# Patient Record
Sex: Female | Born: 1980 | Race: Black or African American | Hispanic: No | Smoking: Current every day smoker
Health system: Southern US, Community
[De-identification: ages and names within clinical notes are randomized; demographics above are authoritative.]

## PROBLEM LIST (undated history)

## (undated) DIAGNOSIS — I10 Essential (primary) hypertension: Secondary | ICD-10-CM

## (undated) DIAGNOSIS — E119 Type 2 diabetes mellitus without complications: Secondary | ICD-10-CM

## (undated) DIAGNOSIS — I2510 Atherosclerotic heart disease of native coronary artery without angina pectoris: Secondary | ICD-10-CM

## (undated) DIAGNOSIS — I219 Acute myocardial infarction, unspecified: Secondary | ICD-10-CM

## (undated) DIAGNOSIS — E785 Hyperlipidemia, unspecified: Secondary | ICD-10-CM

## (undated) HISTORY — PX: BREAST CYST ASPIRATION: SHX578

## (undated) HISTORY — PX: CARDIAC SURGERY: SHX584

## (undated) HISTORY — PX: STENT PLACEMENT VASCULAR (ARMC HX): HXRAD1737

---

## 2002-05-28 HISTORY — PX: TUBAL LIGATION: SHX77

## 2005-07-12 ENCOUNTER — Emergency Department: Payer: Self-pay | Admitting: Emergency Medicine

## 2006-12-23 ENCOUNTER — Emergency Department: Payer: Self-pay | Admitting: Emergency Medicine

## 2007-09-22 ENCOUNTER — Emergency Department: Payer: Self-pay | Admitting: Emergency Medicine

## 2007-10-20 ENCOUNTER — Emergency Department: Payer: Self-pay | Admitting: Emergency Medicine

## 2008-05-05 ENCOUNTER — Emergency Department: Payer: Self-pay | Admitting: Unknown Physician Specialty

## 2010-06-26 ENCOUNTER — Emergency Department: Payer: Self-pay | Admitting: Emergency Medicine

## 2012-05-08 ENCOUNTER — Emergency Department: Payer: Self-pay | Admitting: Emergency Medicine

## 2012-07-14 ENCOUNTER — Ambulatory Visit: Payer: Self-pay | Admitting: Internal Medicine

## 2012-07-15 ENCOUNTER — Ambulatory Visit: Payer: Self-pay

## 2012-12-04 ENCOUNTER — Ambulatory Visit: Payer: Self-pay

## 2012-12-06 ENCOUNTER — Ambulatory Visit: Payer: Self-pay | Admitting: Family Medicine

## 2012-12-08 LAB — WOUND CULTURE

## 2013-04-18 ENCOUNTER — Inpatient Hospital Stay: Payer: Self-pay | Admitting: Student

## 2013-04-18 LAB — COMPREHENSIVE METABOLIC PANEL
Albumin: 3.5 g/dL (ref 3.4–5.0)
Alkaline Phosphatase: 73 U/L
BUN: 11 mg/dL (ref 7–18)
Bilirubin,Total: 0.4 mg/dL (ref 0.2–1.0)
Calcium, Total: 9.1 mg/dL (ref 8.5–10.1)
Creatinine: 0.97 mg/dL (ref 0.60–1.30)
EGFR (African American): >60
Glucose: 92 mg/dL (ref 65–99)
SGOT(AST): 44 U/L — ABNORMAL HIGH (ref 15–37)
SGPT (ALT): 38 U/L (ref 12–78)
Sodium: 139 mmol/L (ref 136–145)
Total Protein: 7.7 g/dL (ref 6.4–8.2)

## 2013-04-18 LAB — CBC
HGB: 15 g/dL (ref 12.0–16.0)
MCH: 30 pg (ref 26.0–34.0)
MCHC: 34.5 g/dL (ref 32.0–36.0)
Platelet: 261 10*3/uL (ref 150–440)
RDW: 14.1 % (ref 11.5–14.5)
WBC: 8.8 10*3/uL (ref 3.6–11.0)

## 2013-04-18 LAB — TROPONIN I: Troponin-I: 0.31 ng/mL — ABNORMAL HIGH

## 2013-04-18 LAB — CK-MB: CK-MB: 1.3 ng/mL (ref 0.5–3.6)

## 2013-04-19 LAB — BASIC METABOLIC PANEL
Anion Gap: 5 — ABNORMAL LOW (ref 7–16)
BUN: 13 mg/dL (ref 7–18)
Chloride: 105 mmol/L (ref 98–107)
Co2: 27 mmol/L (ref 21–32)
Creatinine: 0.95 mg/dL (ref 0.60–1.30)
EGFR (African American): >60
Osmolality: 274 (ref 275–301)
Sodium: 137 mmol/L (ref 136–145)

## 2013-04-19 LAB — LIPID PANEL
Cholesterol: 192 mg/dL (ref 0–200)
Triglycerides: 416 mg/dL — ABNORMAL HIGH (ref 0–200)

## 2013-04-19 LAB — APTT
Activated PTT: 59.6 secs — ABNORMAL HIGH (ref 23.6–35.9)
Activated PTT: 56.9 secs — ABNORMAL HIGH (ref 23.6–35.9)

## 2013-04-19 LAB — CK-MB: CK-MB: 4.6 ng/mL — ABNORMAL HIGH (ref 0.5–3.6)

## 2013-04-19 LAB — TROPONIN I: Troponin-I: 2 ng/mL — ABNORMAL HIGH

## 2013-04-19 LAB — TSH: Thyroid Stimulating Horm: 0.56 u[IU]/mL

## 2013-04-19 LAB — HEMOGLOBIN A1C: Hemoglobin A1C: 5.6 % (ref 4.2–6.3)

## 2013-04-20 LAB — APTT: Activated PTT: 71 secs — ABNORMAL HIGH (ref 23.6–35.9)

## 2013-04-20 LAB — PLATELET COUNT: Platelet: 265 10*3/uL (ref 150–440)

## 2013-04-21 LAB — MAGNESIUM: Magnesium: 1.6 mg/dL — ABNORMAL LOW

## 2013-04-21 LAB — BASIC METABOLIC PANEL
Anion Gap: 8 (ref 7–16)
Calcium, Total: 8.7 mg/dL (ref 8.5–10.1)
Chloride: 106 mmol/L (ref 98–107)
Creatinine: 0.74 mg/dL (ref 0.60–1.30)
Glucose: 104 mg/dL — ABNORMAL HIGH (ref 65–99)
Osmolality: 276 (ref 275–301)
Sodium: 138 mmol/L (ref 136–145)

## 2013-04-21 LAB — APTT: Activated PTT: 29.4 secs (ref 23.6–35.9)

## 2013-04-21 LAB — CK-MB: CK-MB: 0.5 ng/mL (ref 0.5–3.6)

## 2013-06-15 ENCOUNTER — Inpatient Hospital Stay: Payer: Self-pay | Admitting: Student

## 2013-06-15 LAB — BASIC METABOLIC PANEL
Anion Gap: 4 — ABNORMAL LOW (ref 7–16)
BUN: 10 mg/dL (ref 7–18)
Calcium, Total: 9.3 mg/dL (ref 8.5–10.1)
Chloride: 107 mmol/L (ref 98–107)
Co2: 28 mmol/L (ref 21–32)
Creatinine: 0.84 mg/dL (ref 0.60–1.30)
EGFR (African American): >60
EGFR (Non-African Amer.): >60
Glucose: 88 mg/dL (ref 65–99)
OSMOLALITY: 276 (ref 275–301)
Potassium: 3.3 mmol/L — ABNORMAL LOW (ref 3.5–5.1)
Sodium: 139 mmol/L (ref 136–145)

## 2013-06-15 LAB — CK-MB
CK-MB: 2.3 ng/mL (ref 0.5–3.6)
CK-MB: 1.7 ng/mL (ref 0.5–3.6)

## 2013-06-15 LAB — CBC
HCT: 44.2 % (ref 35.0–47.0)
HGB: 14.9 g/dL (ref 12.0–16.0)
MCH: 30.4 pg (ref 26.0–34.0)
MCHC: 33.6 g/dL (ref 32.0–36.0)
MCV: 90 fL (ref 80–100)
Platelet: 276 10*3/uL (ref 150–440)
RBC: 4.89 10*6/uL (ref 3.80–5.20)
RDW: 14.5 % (ref 11.5–14.5)
WBC: 8.6 10*3/uL (ref 3.6–11.0)

## 2013-06-15 LAB — TROPONIN I
TROPONIN-I: 0.9 ng/mL — AB
Troponin-I: 1 ng/mL — ABNORMAL HIGH

## 2013-06-15 LAB — PRO B NATRIURETIC PEPTIDE: B-Type Natriuretic Peptide: 430 pg/mL — ABNORMAL HIGH (ref 0–125)

## 2013-06-15 LAB — APTT: ACTIVATED PTT: 26.9 s (ref 23.6–35.9)

## 2013-06-15 LAB — PROTIME-INR
INR: 0.9
Prothrombin Time: 12.1 secs (ref 11.5–14.7)

## 2013-06-16 LAB — CBC WITH DIFFERENTIAL/PLATELET
BASOS ABS: 0.1 10*3/uL (ref 0.0–0.1)
BASOS PCT: 1.2 %
EOS ABS: 0.2 10*3/uL (ref 0.0–0.7)
Eosinophil %: 2.3 %
HCT: 39.8 % (ref 35.0–47.0)
HGB: 13.5 g/dL (ref 12.0–16.0)
Lymphocyte #: 2.1 10*3/uL (ref 1.0–3.6)
Lymphocyte %: 30.4 %
MCH: 30.4 pg (ref 26.0–34.0)
MCHC: 33.8 g/dL (ref 32.0–36.0)
MCV: 90 fL (ref 80–100)
MONO ABS: 0.3 x10 3/mm (ref 0.2–0.9)
Monocyte %: 4.3 %
Neutrophil #: 4.4 10*3/uL (ref 1.4–6.5)
Neutrophil %: 61.8 %
PLATELETS: 244 10*3/uL (ref 150–440)
RBC: 4.43 10*6/uL (ref 3.80–5.20)
RDW: 14 % (ref 11.5–14.5)
WBC: 7 10*3/uL (ref 3.6–11.0)

## 2013-06-16 LAB — CK-MB: CK-MB: 1.6 ng/mL (ref 0.5–3.6)

## 2013-06-16 LAB — LIPID PANEL
Cholesterol: 163 mg/dL (ref 0–200)
HDL: 34 mg/dL — AB (ref 40–60)
Ldl Cholesterol, Calc: 92 mg/dL (ref 0–100)
Triglycerides: 185 mg/dL (ref 0–200)
VLDL CHOLESTEROL, CALC: 37 mg/dL (ref 5–40)

## 2013-06-16 LAB — APTT
Activated PTT: 42.8 secs — ABNORMAL HIGH (ref 23.6–35.9)
Activated PTT: 47.9 secs — ABNORMAL HIGH (ref 23.6–35.9)
Activated PTT: 56.2 secs — ABNORMAL HIGH (ref 23.6–35.9)
Activated PTT: 82.6 secs — ABNORMAL HIGH (ref 23.6–35.9)

## 2013-06-16 LAB — BASIC METABOLIC PANEL
Anion Gap: 6 — ABNORMAL LOW (ref 7–16)
BUN: 8 mg/dL (ref 7–18)
CALCIUM: 8.5 mg/dL (ref 8.5–10.1)
CHLORIDE: 107 mmol/L (ref 98–107)
Co2: 23 mmol/L (ref 21–32)
Creatinine: 0.73 mg/dL (ref 0.60–1.30)
EGFR (African American): >60
EGFR (Non-African Amer.): >60
Glucose: 102 mg/dL — ABNORMAL HIGH (ref 65–99)
Osmolality: 270 (ref 275–301)
POTASSIUM: 3.6 mmol/L (ref 3.5–5.1)
Sodium: 136 mmol/L (ref 136–145)

## 2013-06-16 LAB — GLUCOSE, RANDOM: GLUCOSE: 80 mg/dL (ref 65–99)

## 2013-06-16 LAB — TSH: Thyroid Stimulating Horm: 1 u[IU]/mL

## 2013-06-16 LAB — TROPONIN I: Troponin-I: 0.78 ng/mL — ABNORMAL HIGH

## 2013-06-16 LAB — MAGNESIUM: Magnesium: 1.7 mg/dL — ABNORMAL LOW

## 2013-06-16 LAB — HEMOGLOBIN A1C: HEMOGLOBIN A1C: 5.7 % (ref 4.2–6.3)

## 2013-06-17 LAB — BASIC METABOLIC PANEL
ANION GAP: 5 — AB (ref 7–16)
BUN: 11 mg/dL (ref 7–18)
CHLORIDE: 103 mmol/L (ref 98–107)
CREATININE: 0.76 mg/dL (ref 0.60–1.30)
Calcium, Total: 9.2 mg/dL (ref 8.5–10.1)
Co2: 26 mmol/L (ref 21–32)
EGFR (African American): >60
EGFR (Non-African Amer.): >60
GLUCOSE: 108 mg/dL — AB (ref 65–99)
Osmolality: 268 (ref 275–301)
Potassium: 3.4 mmol/L — ABNORMAL LOW (ref 3.5–5.1)
Sodium: 134 mmol/L — ABNORMAL LOW (ref 136–145)

## 2013-06-17 LAB — CBC WITH DIFFERENTIAL/PLATELET
Basophil #: 0.1 10*3/uL (ref 0.0–0.1)
Basophil %: 1 %
Eosinophil #: 0.2 10*3/uL (ref 0.0–0.7)
Eosinophil %: 2.7 %
HCT: 41 % (ref 35.0–47.0)
HGB: 14 g/dL (ref 12.0–16.0)
LYMPHS PCT: 34.7 %
Lymphocyte #: 2.5 10*3/uL (ref 1.0–3.6)
MCH: 30.7 pg (ref 26.0–34.0)
MCHC: 34.1 g/dL (ref 32.0–36.0)
MCV: 90 fL (ref 80–100)
MONOS PCT: 5.2 %
Monocyte #: 0.4 x10 3/mm (ref 0.2–0.9)
Neutrophil #: 4.1 10*3/uL (ref 1.4–6.5)
Neutrophil %: 56.4 %
Platelet: 241 10*3/uL (ref 150–440)
RBC: 4.55 10*6/uL (ref 3.80–5.20)
RDW: 13.7 % (ref 11.5–14.5)
WBC: 7.2 10*3/uL (ref 3.6–11.0)

## 2013-06-17 LAB — PROTIME-INR
INR: 1
PROTHROMBIN TIME: 13 s (ref 11.5–14.7)

## 2013-06-17 LAB — APTT: Activated PTT: 105.4 secs — ABNORMAL HIGH (ref 23.6–35.9)

## 2013-06-21 ENCOUNTER — Observation Stay: Payer: Self-pay | Admitting: Internal Medicine

## 2013-06-21 LAB — COMPREHENSIVE METABOLIC PANEL
ALT: 21 U/L (ref 12–78)
Albumin: 3.5 g/dL (ref 3.4–5.0)
Alkaline Phosphatase: 68 U/L
Anion Gap: 9 (ref 7–16)
BUN: 19 mg/dL — ABNORMAL HIGH (ref 7–18)
Bilirubin,Total: 0.5 mg/dL (ref 0.2–1.0)
CO2: 24 mmol/L (ref 21–32)
Calcium, Total: 9.1 mg/dL (ref 8.5–10.1)
Chloride: 102 mmol/L (ref 98–107)
Creatinine: 1.26 mg/dL (ref 0.60–1.30)
EGFR (Non-African Amer.): 56 — ABNORMAL LOW
GLUCOSE: 158 mg/dL — AB (ref 65–99)
Osmolality: 276 (ref 275–301)
POTASSIUM: 3 mmol/L — AB (ref 3.5–5.1)
SGOT(AST): 20 U/L (ref 15–37)
SODIUM: 135 mmol/L — AB (ref 136–145)
Total Protein: 7.6 g/dL (ref 6.4–8.2)

## 2013-06-21 LAB — CK TOTAL AND CKMB (NOT AT ARMC)
CK, Total: 104 U/L (ref 21–215)
CK, Total: 97 U/L (ref 21–215)
CK, Total: 99 U/L (ref 21–215)
CK-MB: 2.1 ng/mL (ref 0.5–3.6)
CK-MB: 2.6 ng/mL (ref 0.5–3.6)
CK-MB: 2.1 ng/mL (ref 0.5–3.6)

## 2013-06-21 LAB — CBC WITH DIFFERENTIAL/PLATELET
Basophil #: 0.1 10*3/uL (ref 0.0–0.1)
Basophil %: 0.7 %
EOS PCT: 0.3 %
Eosinophil #: 0.1 10*3/uL (ref 0.0–0.7)
HCT: 43.8 % (ref 35.0–47.0)
HGB: 14.6 g/dL (ref 12.0–16.0)
LYMPHS PCT: 21.4 %
Lymphocyte #: 3.5 10*3/uL (ref 1.0–3.6)
MCH: 29.8 pg (ref 26.0–34.0)
MCHC: 33.3 g/dL (ref 32.0–36.0)
MCV: 90 fL (ref 80–100)
MONO ABS: 0.6 x10 3/mm (ref 0.2–0.9)
MONOS PCT: 3.8 %
NEUTROS PCT: 73.8 %
Neutrophil #: 12.1 10*3/uL — ABNORMAL HIGH (ref 1.4–6.5)
Platelet: 272 10*3/uL (ref 150–440)
RBC: 4.89 10*6/uL (ref 3.80–5.20)
RDW: 14.4 % (ref 11.5–14.5)
WBC: 16.4 10*3/uL — ABNORMAL HIGH (ref 3.6–11.0)

## 2013-06-21 LAB — URINALYSIS, COMPLETE
Bacteria: NONE SEEN
Bilirubin,UR: NEGATIVE
Glucose,UR: NEGATIVE mg/dL (ref 0–75)
Ketone: NEGATIVE
Leukocyte Esterase: NEGATIVE
NITRITE: NEGATIVE
PH: 5 (ref 4.5–8.0)
PROTEIN: NEGATIVE
RBC,UR: NONE SEEN /HPF (ref 0–5)
Specific Gravity: 1.027 (ref 1.003–1.030)

## 2013-06-21 LAB — MAGNESIUM: MAGNESIUM: 1.7 mg/dL — AB

## 2013-06-21 LAB — TROPONIN I
TROPONIN-I: 0.05 ng/mL
TROPONIN-I: 0.05 ng/mL
Troponin-I: 0.05 ng/mL
Troponin-I: 0.04 ng/mL

## 2013-06-21 LAB — PROTIME-INR
INR: 1
PROTHROMBIN TIME: 12.6 s (ref 11.5–14.7)

## 2013-06-21 LAB — DRUG SCREEN, URINE
AMPHETAMINES, UR SCREEN: NEGATIVE (ref ?–1000)
BENZODIAZEPINE, UR SCRN: NEGATIVE (ref ?–200)
Barbiturates, Ur Screen: NEGATIVE (ref ?–200)
CANNABINOID 50 NG, UR ~~LOC~~: POSITIVE (ref ?–50)
COCAINE METABOLITE, UR ~~LOC~~: NEGATIVE (ref ?–300)
MDMA (ECSTASY) UR SCREEN: NEGATIVE (ref ?–500)
METHADONE, UR SCREEN: NEGATIVE (ref ?–300)
OPIATE, UR SCREEN: NEGATIVE (ref ?–300)
Phencyclidine (PCP) Ur S: NEGATIVE (ref ?–25)
TRICYCLIC, UR SCREEN: NEGATIVE (ref ?–1000)

## 2013-06-21 LAB — APTT: Activated PTT: 26.9 secs (ref 23.6–35.9)

## 2013-06-21 LAB — LIPASE, BLOOD: Lipase: 146 U/L (ref 73–393)

## 2013-06-21 LAB — PREGNANCY, URINE: Pregnancy Test, Urine: NEGATIVE m[IU]/mL

## 2013-06-22 LAB — CBC WITH DIFFERENTIAL/PLATELET
BASOS PCT: 1.3 %
Basophil #: 0.1 10*3/uL (ref 0.0–0.1)
EOS PCT: 1.9 %
Eosinophil #: 0.1 10*3/uL (ref 0.0–0.7)
HCT: 44.2 % (ref 35.0–47.0)
HGB: 14.8 g/dL (ref 12.0–16.0)
LYMPHS ABS: 2.9 10*3/uL (ref 1.0–3.6)
Lymphocyte %: 41.1 %
MCH: 30.3 pg (ref 26.0–34.0)
MCHC: 33.5 g/dL (ref 32.0–36.0)
MCV: 90 fL (ref 80–100)
Monocyte #: 0.5 x10 3/mm (ref 0.2–0.9)
Monocyte %: 7.6 %
Neutrophil #: 3.4 10*3/uL (ref 1.4–6.5)
Neutrophil %: 48.1 %
Platelet: 242 10*3/uL (ref 150–440)
RBC: 4.89 10*6/uL (ref 3.80–5.20)
RDW: 14.3 % (ref 11.5–14.5)
WBC: 7.1 10*3/uL (ref 3.6–11.0)

## 2013-06-22 LAB — BASIC METABOLIC PANEL
ANION GAP: 2 — AB (ref 7–16)
BUN: 18 mg/dL (ref 7–18)
CHLORIDE: 102 mmol/L (ref 98–107)
Calcium, Total: 9.5 mg/dL (ref 8.5–10.1)
Co2: 28 mmol/L (ref 21–32)
Creatinine: 0.82 mg/dL (ref 0.60–1.30)
EGFR (Non-African Amer.): >60
GLUCOSE: 100 mg/dL — AB (ref 65–99)
Osmolality: 267 (ref 275–301)
POTASSIUM: 3.5 mmol/L (ref 3.5–5.1)
Sodium: 132 mmol/L — ABNORMAL LOW (ref 136–145)

## 2013-06-22 LAB — LIPID PANEL
CHOLESTEROL: 195 mg/dL (ref 0–200)
HDL Cholesterol: 32 mg/dL — ABNORMAL LOW (ref 40–60)
Ldl Cholesterol, Calc: 107 mg/dL — ABNORMAL HIGH (ref 0–100)
TRIGLYCERIDES: 280 mg/dL — AB (ref 0–200)
VLDL CHOLESTEROL, CALC: 56 mg/dL — AB (ref 5–40)

## 2013-09-02 ENCOUNTER — Encounter: Payer: Self-pay | Admitting: Cardiovascular Disease

## 2014-01-07 LAB — CBC
HCT: 45.6 % (ref 35.0–47.0)
HGB: 15.5 g/dL (ref 12.0–16.0)
MCH: 30.4 pg (ref 26.0–34.0)
MCHC: 33.9 g/dL (ref 32.0–36.0)
MCV: 90 fL (ref 80–100)
PLATELETS: 266 10*3/uL (ref 150–440)
RBC: 5.08 10*6/uL (ref 3.80–5.20)
RDW: 13.8 % (ref 11.5–14.5)
WBC: 10.8 10*3/uL (ref 3.6–11.0)

## 2014-01-08 ENCOUNTER — Observation Stay: Payer: Self-pay | Admitting: Internal Medicine

## 2014-01-08 LAB — CK TOTAL AND CKMB (NOT AT ARMC)
CK, TOTAL: 75 U/L
CK, Total: 86 U/L
CK, Total: 87 U/L
CK-MB: <0.5 ng/mL — ABNORMAL LOW (ref 0.5–3.6)
CK-MB: <0.5 ng/mL — ABNORMAL LOW (ref 0.5–3.6)
CK-MB: <0.5 ng/mL — ABNORMAL LOW (ref 0.5–3.6)

## 2014-01-08 LAB — BASIC METABOLIC PANEL
Anion Gap: 5 — ABNORMAL LOW (ref 7–16)
BUN: 19 mg/dL — AB (ref 7–18)
CALCIUM: 9.9 mg/dL (ref 8.5–10.1)
Chloride: 102 mmol/L (ref 98–107)
Co2: 29 mmol/L (ref 21–32)
Creatinine: 0.86 mg/dL (ref 0.60–1.30)
EGFR (African American): >60
Glucose: 95 mg/dL (ref 65–99)
Osmolality: 274 (ref 275–301)
POTASSIUM: 3.4 mmol/L — AB (ref 3.5–5.1)
SODIUM: 136 mmol/L (ref 136–145)

## 2014-01-08 LAB — TROPONIN I: Troponin-I: <0.02 ng/mL

## 2014-01-08 LAB — LIPID PANEL
Cholesterol: 214 mg/dL — ABNORMAL HIGH (ref 0–200)
HDL Cholesterol: 42 mg/dL (ref 40–60)
Ldl Cholesterol, Calc: 116 mg/dL — ABNORMAL HIGH (ref 0–100)
TRIGLYCERIDES: 280 mg/dL — AB (ref 0–200)
VLDL Cholesterol, Calc: 56 mg/dL — ABNORMAL HIGH (ref 5–40)

## 2014-01-08 LAB — D-DIMER(ARMC): D-DIMER: 436 ng/mL

## 2014-02-19 ENCOUNTER — Emergency Department: Payer: Self-pay | Admitting: Emergency Medicine

## 2014-07-19 ENCOUNTER — Emergency Department: Payer: Self-pay | Admitting: Emergency Medicine

## 2014-09-17 NOTE — H&P (Signed)
PATIENT NAME:  COUSIVerdon Cummins, Chandrea H MR#:  [id number] DATE OF BIRTH:  [birth year]  DATE OF ADMISSION:  04/18/2013  EMERGENCY DEPARTMENT REFERRING PHYSICIAN: Lurena JoinerRebecca L. Shaune PollackLord, MD  PRIMARY CARE PROVIDER: Courtney ParisElizabeth B. White, FNP  CHIEF COMPLAINT: Right-sided headache, right-sided neck pain, as well as left-sided chest pain.   HISTORY OF PRESENT ILLNESS: The patient is a 34 year old African American female, morbidly obese, who has a history of diabetes, which is borderline, as well as hypertension. She has also been told that her cholesterol has been high, who reports earlier today she started developing right-sided headache, which was sharp in nature. The pain went down to her shoulder as well as her neck. She stated that the symptoms were very severe. When she arrived in the ED, she was noted to have a systolic blood pressure of 206/118. The patient received a nitroglycerin patch, and her blood pressure did decrease. Her pain also improved. The patient also stated that she had an episode lasting a few minutes of left-sided chest pressure, and it went away. She has not had these type of symptoms before. She also got short of breath. Came to the ED. When in the ER, she was noted to have a troponin of 0.31. She otherwise denies any nausea, vomiting, or urinary symptoms.   PAST MEDICAL HISTORY: Diabetes type 2 borderline, hypertension, has been told that her cholesterol is high.   PAST SURGICAL HISTORY: Has had surgical resection of lesions under her arms.   ALLERGIES: None.  CURRENT MEDICATIONS: As outpatient, she is on benazepril/HCTZ 20/25 one tab p.o. daily,  trazodone 50 daily.   SOCIAL HISTORY: Smokes about 1 pack per day and drinks on weekends. She reports that she drinks about 6 beers. No drug use.   FAMILY HISTORY: Grandfather with CABG, sister with cardiomyopathy at age 36.   REVIEW OF SYSTEMS:    CONSTITUTIONAL: Denies any fevers. Complains of fatigue, weakness. Complains of headache. No weight  loss or weight gain.  EYES: No blurred or double vision. No pain. No redness. No inflammation. No glaucoma. No cataracts.  EARS, NOSE, THROAT: No tinnitus. No ear pain. No hearing loss. No seasonal or year-round allergies. No difficulty swallowing.  RESPIRATORY: Denies any cough, wheezing. No hemoptysis. No dyspnea. No painful respiration. No COPD.  CARDIOVASCULAR: Complains of chest pain. No edema. No arrhythmia. No palpitations. No syncope.  GASTROINTESTINAL: No nausea, vomiting, diarrhea. No abdominal pain. No hematemesis. No melena. No GERD. No IBS. No jaundice.  GENITOURINARY: Denies any dysuria, hematuria, renal calculus or frequency.  ENDOCRINE: Denies polyuria, nocturia or thyroid problems.  HEMATOLOGIC AND LYMPHATIC: Denies anemia, easy bruisability or bleeding.  SKIN: No acne or rash. No changes in mole, hair or skin.  MUSCULOSKELETAL: Complains of pain in the right neck. NEUROLOGIC: No CVA, TIA or seizures.  PSYCHIATRIC: No anxiety. No insomnia. No ADD.   PHYSICAL EXAMINATION:  VITAL SIGNS: Temperature 97.8, pulse 91, respirations 20, blood pressure 206/118, O2 of 99%.  GENERAL: The patient is obese, in no acute distress.  HEENT: Head atraumatic, normocephalic. Pupils equally round and reactive to light and accommodation. No conjunctival pallor. No icterus. Extraocular movements intact. Nose: No nasal lesions or drainage. Ears: No drainage or lesions. Mouth: No lesions. No exudate.  NECK: Supple without any JVD. No thyromegaly.  RESPIRATORY: Clear to auscultation bilaterally without any rales, rhonchi or wheezing.  CARDIOVASCULAR: Regular rate and rhythm. No murmurs, rubs, clicks or gallops.  ABDOMEN: Soft, nontender, nondistended. Positive bowel sounds x 4.  GENITOURINARY: Deferred.  MUSCULOSKELETAL: There is no erythema or swelling.  SKIN: No rash.  LYMPHATICS: No lymph nodes palpable.  VASCULAR: Good DP and PT pulses.  PSYCHIATRIC: Not anxious or depressed.  NEUROLOGICAL:  Cranial nerves II through XII grossly intact.  reflexes  intact strengtj 5/5 all ext  EKG showed normal sinus rhythm, possible inferior infarct. CT scan of the head shows no acute abnormalities. CT angio of the neck shows negative.   ASSESSMENT AND PLAN: The patient is a 34 year old African American female with history of hypertension, diabetes borderline, who developed right-sided headache, right-sided neck pain, also brief episode of left-sided chest pain, presents to ED, noted to have systolic blood pressure in the 200s.  1.  Headache with CT of the head negative, likely due to accelerated blood pressure. At this time, will control her blood pressure. Use Tylenol p.r.n. for pain control. 2.  Accelerated hypertension, improved with nitroglycerin patch. Will continue nitroglycerin patch. Continue home treatment with benazepril/HCTZ.  3.  Elevated troponin with chest pain, possibly non-ST segment elevation myocardial infarction, possible demand ischemia due to accelerated hypertension. Will place her on aspirin. Cardiology evaluation. Echocardiogram of the heart.  4.  Nicotine addiction. The patient was counseled regarding smoking cessation, 4 minutes spent. The patient wants to try a nicotine patch, which we will start. Encouraged patient strongly to stop smoking.   TIME SPENT: 50 minutes spent.     ____________________________ Lacie Scotts. Allena Katz, MD shp:jcm D: 04/18/2013 14:46:04 ET T: 04/18/2013 16:45:47 ET JOB#: [id number]  cc: Claira Jeter H. Allena Katz, MD, <Dictator> Charise Carwin MD ELECTRONICALLY SIGNED 04/19/2013 20:59

## 2014-09-17 NOTE — Consult Note (Signed)
Chief Complaint:  Subjective/Chief Complaint Pt states improved cp HA and neck pain.   VITAL SIGNS/ANCILLARY NOTES: **Vital Signs.:   23-Nov-14 07:27  Temperature Temperature (F) 98  Celsius 36.6  Temperature Source oral  Pulse Pulse 78  Respirations Respirations 18  Systolic BP Systolic BP 563  Diastolic BP (mmHg) Diastolic BP (mmHg) 86  Mean BP 99  Pulse Ox % Pulse Ox % 97  Pulse Ox Activity Level  At rest  Oxygen Delivery Room Air/ 21 %  *Intake and Output.:   Daily 23-Nov-14 07:00  Grand Totals Intake:  0 Output:      Net:  0 24 Hr.:  0  Oral Intake      In:  0   Brief Assessment:  GEN well developed, well nourished, no acute distress   Cardiac Regular  Irregular  no murmur  -- JVD  --Gallop   Respiratory normal resp effort  clear BS   Gastrointestinal Normal   Gastrointestinal details normal Soft  Bowel sounds normal   EXTR negative cyanosis/clubbing, negative edema   Lab Results: Thyroid:  23-Nov-14 05:29   Thyroid Stimulating Hormone 0.56 (0.45-4.50 (International Unit)  ----------------------- Pregnant patients have  different reference  ranges for TSH:  - - - - - - - - - -  Pregnant, first trimetser:  0.36 - 2.50 uIU/mL)  LabObservation:  22-Nov-14 15:08   OBSERVATION Reason for Test  Hepatic:  22-Nov-14 10:14   Bilirubin, Total 0.4  Alkaline Phosphatase 73 (45-117 NOTE: New Reference Range 04/17/13)  SGPT (ALT) 38  SGOT (AST)  44  Total Protein, Serum 7.7  Albumin, Serum 3.5  Cardiology:  22-Nov-14 15:08   Echo Doppler REASON FOR EXAM:     COMMENTS:     PROCEDURE: [facility] - ECHO DOPPLER COMPLETE(TRANSTHOR)  - Apr 18 2013  3:08PM   RESULT: Echocardiogram Report  Patient Name:   [name] Date of Exam: 04/18/2013 Medical Rec #:  [id number]               Custom1: Date of Birth:  21-Oct-1980            Height:       60.0 in Patient Age:    34 years             Weight:       170.0 lb Patient Gender: F                    BSA:           1.74 m??  Indications: Elevated Tropin Sonographer:    Arville Go RDCS Referring Phys: Harvest Dark, A  Summary:  1. Left ventricular ejection fraction, by visual estimation, is 60 to  65%.  2. Normal global left ventricular systolic function.  3. Moderate concentric left ventricular hypertrophy.  4.Moderately increased left ventricular septal thickness.  5. Decreased left ventricular internal cavity size.  6. Mild mitral valve regurgitation.  7. Severely increased left ventricular posterior wall thickness.  8. Mild tricuspid regurgitation. 2D AND M-MODE MEASUREMENTS (normal ranges within parentheses): Left Ventricle:          Normal IVSd (2D):      1.78 cm (0.7-1.1) LVPWd (2D):     1.52 cm (0.7-1.1) Aorta/LA:                  Normal LVIDd (2D):     2.75 cm (3.4-5.7) Aortic Root (2D): 2.80 cm (2.4-3.7) LVIDs (2D):     1.81  cm           Left Atrium (2D): 2.90 cm (1.9-4.0) LV FS (2D):     34.2 %   (>25%) LV EF (2D):     65.1 %   (>50%)                                   Right Ventricle:                                   RVd (2D): LV DIASTOLIC FUNCTION: MV Peak E: 0.64 m/s Decel Time: 190 msec MV Peak A: 0.67 m/s E/A Ratio: 0.96 SPECTRAL DOPPLER ANALYSIS (where applicable): Mitral Valve: MV P1/2 Time: 55.10 msec MV Area, PHT: 3.99 cm?? Aortic Valve: AoV Max Vel: 1.15 m/s AoV Peak PG: 5.3 mmHg AoV Mean PG: LVOT Vmax: 0.97 m/s LVOT VTI:  LVOT Diameter: 1.60 cm AoV Area, Vmax: 1.69 cm?? AoV Area, VTI:  AoV Area, Vmn: Pulmonic Valve: PV Max Velocity: 1.07 m/s PV Max PG: 4.6 mmHg PV Mean PG:  PHYSICIAN INTERPRETATION: Left Ventricle: The left ventricular internal cavity size was decreased.  LV septal wall thickness was moderately increased. LV posterior wall  thickness was severely increased. Moderate concentric left ventricular  hypertrophy. Global LV systolic function was normal. Left ventricular  ejection fraction, by visual estimation, is 60 to 65%. Right  Ventricle: The right ventricular size is normal. Global RV systolic  function is normal. Left Atrium: The left atrium is normal in size. Right Atrium: The right atrium is normal in size. Pericardium: There is no evidence of pericardial effusion. Mitral Valve: The mitral valve is normal in structure. Mild mitral valve  regurgitation is seen. Tricuspid Valve: The tricuspid valve is normal. Mild tricuspid  regurgitation is visualized. Aortic Valve: The aortic valve is normal. No evidence of aortic valve  regurgitation is seen. Pulmonic Valve: The pulmonic valve is normal.  Marie MD Electronically signed by [place] MD Signature Date/Time: 04/19/2013/10:29:34 AM  *** Final *** IMPRESSION: .    Verified By: Yolonda Kida, M.D., MD  Routine Chem:  22-Nov-14 10:14   Result Comment TROPONIN - RESULTS VERIFIED BY REPEAT TESTING.  - LAB/DENIA ROYSTER AT 1055 04/18/13  - READ-BACK PROCESS PERFORMED.  Result(s) reported on 18 Apr 2013 at 10:58AM.  Glucose, Serum 92  BUN 11  Creatinine (comp) 0.97  Sodium, Serum 139  Potassium, Serum 3.6  Chloride, Serum  108  CO2, Serum 26  Calcium (Total), Serum 9.1  Anion Gap  5  Osmolality (calc) 277  eGFR (African American) >60  eGFR (Non-African American) >60 (eGFR values <16m/min/1.73 m2 may be an indication of chronic kidney disease (CKD). Calculated eGFR is useful in patients with stable renal function. The eGFR calculation will not be reliable in acutely ill patients when serum creatinine is changing rapidly. It is not useful in  patients on dialysis. The eGFR calculation may not be applicable to patients at the low and high extremes of body sizes, pregnant women, and vegetarians.)    14:31   Result Comment TROPONIN - RESULTS VERIFIED BY REPEAT TESTING.  - PREVIOUSLY CALLED AT 1055 04/18/13  - BY LAB  Result(s) reported on 18 Apr 2013 at 04:01PM.    18:09   Result Comment TROPONIN - RESULTS VERIFIED  BY REPEAT TESTING.  - PREV.CALLED BY LAB @  1055 ON 04/18/13  - CAF  Result(s) reported on 18 Apr 2013 at 06:52PM.  23-Nov-14 02:59   Hemoglobin A1c [facility]) 5.6 (The American Diabetes Association recommends that a primary goal of therapy should be <7% and that physicians should reevaluate the treatment regimen in patients with HbA1c values consistently >8%.)    05:29   Result Comment TROPONIN - RESULTS VERIFIED BY REPEAT TESTING.  - LAB/DOLL FERGUSON AT 1051 04/19/13  - READ-BACK PROCESS PERFORMED.  Result(s) reported on 19 Apr 2013 at 10:53AM.  Result Comment LDL/VLDL - Unable to report VLDL and LDL due to a  - Triglyceride value that is 400 mg/dL or   - greater.  Result(s) reported on 19 Apr 2013 at 06:23AM.  Glucose, Serum  102  BUN 13  Creatinine (comp) 0.95  Sodium, Serum 137  Potassium, Serum 3.6  Chloride, Serum 105  CO2, Serum 27  Calcium (Total), Serum 8.9  Anion Gap  5  Osmolality (calc) 274  eGFR (African American) >60  eGFR (Non-African American) >60 (eGFR values <39m/min/1.73 m2 may be an indication of chronic kidney disease (CKD). Calculated eGFR is useful in patients with stable renal function. The eGFR calculation will not be reliable in acutely ill patients when serum creatinine is changing rapidly. It is not useful in  patients on dialysis. The eGFR calculation may not be applicable to patients at the low and high extremes of body sizes, pregnant women, and vegetarians.)  Cholesterol, Serum 192  Triglycerides, Serum  416  HDL (INHOUSE)  31  VLDL Cholesterol Calculated SEE COMMENT  LDL Cholesterol Calculated SEE COMMENT  Cardiac:  22-Nov-14 10:14   Troponin I  0.31 (0.00-0.05 0.05 ng/mL or less: NEGATIVE  Repeat testing in 3-6 hrs  if clinically indicated. >0.05 ng/mL: POTENTIAL  MYOCARDIAL INJURY. Repeat  testing in 3-6 hrs if  clinically indicated. NOTE: An increase or decrease  of 30% or more on serial  testing suggests a  clinically important  change)  CPK-MB, Serum 1.3 (Result(s) reported on 18 Apr 2013 at 11:40AM.)    14:31   Troponin I  1.70 (0.00-0.05 0.05 ng/mL or less: NEGATIVE  Repeat testing in 3-6 hrs  if clinically indicated. >0.05 ng/mL: POTENTIAL  MYOCARDIAL INJURY. Repeat  testing in 3-6 hrs if  clinically indicated. NOTE: An increase or decrease  of 30% or more on serial  testing suggests a  clinically important change)  CPK-MB, Serum  6.3 (Result(s) reported on 18 Apr 2013 at 03:04PM.)    18:09   Troponin I  2.70 (0.00-0.05 0.05 ng/mL or less: NEGATIVE  Repeat testing in 3-6 hrs  if clinically indicated. >0.05 ng/mL: POTENTIAL  MYOCARDIAL INJURY. Repeat  testing in 3-6 hrs if  clinically indicated. NOTE: An increase or decrease  of 30% or more on serial  testing suggests a  clinically important change)  CPK-MB, Serum  7.2 (Result(s) reported on 18 Apr 2013 at 06:50PM.)  23-Nov-14 05:29   Troponin I  2.00 (0.00-0.05 0.05 ng/mL or less: NEGATIVE  Repeat testing in 3-6 hrs  if clinically indicated. >0.05 ng/mL: POTENTIAL  MYOCARDIAL INJURY. Repeat  testing in 3-6 hrs if  clinically indicated. NOTE: An increase or decrease  of 30% or more on serial  testing suggests a  clinically important change)  CPK-MB, Serum  4.6 (Result(s) reported on 19 Apr 2013 at 10:45AM.)  Routine Coag:  22-Nov-14 16:29   Activated PTT (APTT) 29.2 (A HCT value >55% may artifactually increase the APTT. In one study, the increase  was an average of 19%. Reference: "Effect on Routine and Special Coagulation Testing Values of Citrate Anticoagulant Adjustment in Patients with High HCT Values." American Journal of Clinical Pathology 2006;126:400-405.)    22:20   Activated PTT (APTT)  56.4 (A HCT value >55% may artifactually increase the APTT. In one study, the increase was an average of 19%. Reference: "Effect on Routine and Special Coagulation Testing Values of Citrate Anticoagulant Adjustment in Patients with High HCT  Values." American Journal of Clinical Pathology 2006;126:400-405.)  23-Nov-14 05:29   Activated PTT (APTT)  59.6 (A HCT value >55% may artifactually increase the APTT. In one study, the increase was an average of 19%. Reference: "Effect on Routine and Special Coagulation Testing Values of Citrate Anticoagulant Adjustment in Patients with High HCT Values." American Journal of Clinical Pathology 2006;126:400-405.)    14:31   Activated PTT (APTT)  46.5 (A HCT value >55% may artifactually increase the APTT. In one study, the increase was an average of 19%. Reference: "Effect on Routine and Special Coagulation Testing Values of Citrate Anticoagulant Adjustment in Patients with High HCT Values." American Journal of Clinical Pathology 2006;126:400-405.)    21:16   Activated PTT (APTT)  56.9 (A HCT value >55% may artifactually increase the APTT. In one study, the increase was an average of 19%. Reference: "Effect on Routine and Special Coagulation Testing Values of Citrate Anticoagulant Adjustment in Patients with High HCT Values." American Journal of Clinical Pathology 2006;126:400-405.)  Routine Hem:  22-Nov-14 10:14   WBC (CBC) 8.8  RBC (CBC) 4.98  Hemoglobin (CBC) 15.0  Hematocrit (CBC) 43.4  Platelet Count (CBC) 261 (Result(s) reported on 18 Apr 2013 at 10:26AM.)  MCV 87  MCH 30.0  MCHC 34.5  RDW 14.1   Radiology Results: XRay:    22-Nov-14 10:27, Chest PA and Lateral  Chest PA and Lateral   REASON FOR EXAM:    chest pressure, dyspnea, hypertension  COMMENTS:       PROCEDURE: DXR - DXR CHEST PA (OR AP) AND LATERAL  - Apr 18 2013 10:27AM     CLINICAL DATA:  Dyspnea and headache    EXAM:  CHEST  2 VIEW    COMPARISON:  None.    FINDINGS:  The heart size and mediastinal contours are within normal limits.  Both lungs are clear. The visualized skeletal structures are  unremarkable.     IMPRESSION:  No active cardiopulmonary disease.      Electronically Signed    By:  Kerby Moors M.D.    On: 04/18/2013 10:28         Verified By: Angelita Ingles, M.D.,  Cardiology:    22-Nov-14 15:08, Echo Doppler  Echo Doppler   REASON FOR EXAM:      COMMENTS:       PROCEDURE: [facility] - ECHO DOPPLER COMPLETE(TRANSTHOR)  - Apr 18 2013  3:08PM     RESULT: Echocardiogram Report    Patient Name:   [name] Date of Exam: 04/18/2013  Medical Rec #:  [id number]               Custom1:  Date of Birth:  20-Oct-1980            Height:       60.0 in  Patient Age:    34 years             Weight:       170.0 lb  Patient Gender: F  BSA:          1.74 m??    Indications: Elevated Tropin  Sonographer:    Arville Go RDCS  Referring Phys: Harvest Dark, A    Summary:   1. Left ventricular ejection fraction, by visual estimation, is 60 to   65%.   2. Normal global left ventricular systolic function.   3. Moderate concentric left ventricular hypertrophy.   4.Moderately increased left ventricular septal thickness.   5. Decreased left ventricular internal cavity size.   6. Mild mitral valve regurgitation.   7. Severely increased left ventricular posterior wall thickness.   8. Mild tricuspid regurgitation.  2D AND M-MODE MEASUREMENTS (normal ranges within parentheses):  Left Ventricle:          Normal  IVSd (2D):      1.78 cm (0.7-1.1)  LVPWd (2D):     1.52 cm (0.7-1.1) Aorta/LA:                  Normal  LVIDd (2D):     2.75 cm (3.4-5.7) Aortic Root (2D): 2.80 cm (2.4-3.7)  LVIDs (2D):     1.81 cm           Left Atrium (2D): 2.90 cm (1.9-4.0)  LV FS (2D):     34.2 %   (>25%)  LV EF (2D):     65.1 %   (>50%)                                    Right Ventricle:                                    RVd (2D):  LV DIASTOLIC FUNCTION:  MV Peak E: 0.64 m/s Decel Time: 190 msec  MV Peak A: 0.67 m/s  E/A Ratio: 0.96  SPECTRAL DOPPLER ANALYSIS (where applicable):  Mitral Valve:  MV P1/2 Time: 55.10 msec  MV Area, PHT: 3.99 cm??  Aortic Valve: AoV  Max Vel: 1.15 m/s AoV Peak PG: 5.3 mmHg AoV Mean PG:  LVOT Vmax: 0.97 m/s LVOT VTI:  LVOT Diameter: 1.60 cm  AoV Area, Vmax: 1.69 cm?? AoV Area, VTI:  AoV Area, Vmn:  Pulmonic Valve:  PV Max Velocity: 1.07 m/s PV Max PG: 4.6 mmHg PV Mean PG:    PHYSICIAN INTERPRETATION:  Left Ventricle: The left ventricular internal cavity size was decreased.   LV septal wall thickness was moderately increased. LV posterior wall   thickness was severely increased. Moderate concentric left ventricular   hypertrophy. Global LV systolic function was normal. Left ventricular   ejection fraction, by visual estimation, is 60 to 65%.  Right Ventricle: The right ventricular size is normal. Global RV systolic   function is normal.  Left Atrium: The left atrium is normal in size.  Right Atrium: The right atrium is normal in size.  Pericardium: There is no evidence of pericardial effusion.  Mitral Valve: The mitral valve is normal in structure. Mild mitral valve   regurgitation is seen.  Tricuspid Valve: The tricuspid valve is normal. Mild tricuspid   regurgitation is visualized.  Aortic Valve: The aortic valve is normal. No evidence of aortic valve   regurgitation is seen.  Pulmonic Valve: The pulmonic valve is normal.    [place] MD  Electronically signed by [place] MD  Signature Date/Time: 04/19/2013/10:29:34  AM    *** Final ***  IMPRESSION: .        Verified By: Yolonda Kida, M.D., MD  CT:    22-Nov-14 13:02, CT Angiography Neck (Carotids)  CT Angiography Neck (Carotids)   REASON FOR EXAM:    neck pain; hypertension; elevated troponin  COMMENTS:       PROCEDURE: CT  - CT ANGIOGRAPHY NECK W/CONTRAST  - Apr 18 2013  1:02PM     CLINICAL DATA:  Neck tightness.  Right arm tingling.    EXAM:  CT ANGIOGRAPHY NECK    TECHNIQUE:  Multidetector CT imaging of the neck was performed using the  standard protocol during bolus administration of intravenous  contrast.  Multiplanar CT image reconstructions including MIPs were  obtained to evaluate the vascular anatomy. Carotid stenosis  measurements (when applicable) are obtained utilizing NASCET  criteria, using the distal internal carotid diameter as the  denominator.    CONTRAST:  80 mL Isovue 370 IV    COMPARISON:  Negative    FINDINGS:  Negative for mass or adenopathy in the neck. Mild cervical kyphosis  is present without focal bony abnormality.    Both carotid arteries are normal. The carotid bifurcation is widely  patent. No evidence of atherosclerotic disease or dissection.  Both vertebral arteries are widely patentwithout stenosis or  dissection. Vertebral arteries are equal in size and both are patent  to the basilar.    Three vessel arch.  Proximal great vessels are widely patent.    Review of the MIP images confirms the above findings.     IMPRESSION:  Negative      Electronically Signed    By: Franchot Gallo M.D.    On: 04/18/2013 13:12     Verified By: Truett Perna, M.D.,    [phone] 13:02, CT Head Without Contrast  CT Head Without Contrast   REASON FOR EXAM:    headache  COMMENTS:       PROCEDURE: CT  - CT HEAD WITHOUT CONTRAST  - Apr 18 2013  1:02PM     CLINICAL DATA:  Headache    EXAM:  CT HEAD WITHOUT CONTRAST    TECHNIQUE:  Contiguous axial images were obtained from the base of the skull  through the vertex without intravenous contrast.    COMPARISON:  None.  FINDINGS:  Ventricle size is normal. Negative for acute or chronic infarction.  Negative for hemorrhage or fluid collection. Negative for mass or  edema. No shift of the midline structures.    Calvarium is intact.     IMPRESSION:  Normal      Electronically Signed    By: Franchot Gallo M.D.    On: 04/18/2013 13:07     Verified By: Truett Perna, M.D.,   Assessment/Plan:  Invasive Device Daily Assessment of Necessity:  Does the patient currently have any of the following indwelling  devices? none   Assessment/Plan:  Assessment IP Angina CP HTN GERD DM Elevated troponins HA Hyperlipidemia .   Plan PLAN Tele  ASA daily Heparin IV NTG Bp control F/U enzymes F/U Ekgs Myoview in the am Consider cath if myoview not normal   Electronic Signatures: Lujean Amel D (MD)  (Signed [phone] 23:09)  Authored: Chief Complaint, VITAL SIGNS/ANCILLARY NOTES, Brief Assessment, Lab Results, Radiology Results, Assessment/Plan   Last Updated: 23-Nov-14 23:09 by Lujean Amel D (MD)

## 2014-09-17 NOTE — Consult Note (Signed)
Chief Complaint:  Subjective/Chief Complaint Pt states to be doing well . Denies further cp today. Ready to go home. Groin ok.   VITAL SIGNS/ANCILLARY NOTES: **Vital Signs.:   25-Nov-14 07:53  Vital Signs Type Routine; to ccu room 15  Temperature Temperature (F) 98.4  Celsius 36.8  Temperature Source oral  Pulse Pulse 96  Respirations Respirations 20  Systolic BP Systolic BP 409  Diastolic BP (mmHg) Diastolic BP (mmHg) 84  Mean BP 107  Pulse Ox % Pulse Ox % 98  Pulse Ox Activity Level  At rest  Oxygen Delivery Room Air/ 21 %  Pulse Ox Heart Rate 100  *Intake and Output.:   Shift 25-Nov-14 07:00  IV (Primary)      In:  750  Length of Stay Totals Intake:  2711.6 Output:      Net:  2711.6    Daily 07:00  Grand Totals Intake:  1865 Output:      Net:  1865 24 Hr.:  1865  Oral Intake      In:  240  IV (Primary)      In:  1625  Length of Stay Totals Intake:  2711.6 Output:      Net:  2711.6   Brief Assessment:  GEN well developed, well nourished, no acute distress   Cardiac Regular  murmur present  -- JVD  --Rub   Respiratory normal resp effort  clear BS   Gastrointestinal Normal   Gastrointestinal details normal Soft   EXTR negative cyanosis/clubbing, negative edema   Additional Physical Exam Right groin ok   Lab Results: Thyroid:  23-Nov-14 05:29   Thyroid Stimulating Hormone 0.56 (0.45-4.50 (International Unit)  ----------------------- Pregnant patients have  different reference  ranges for TSH:  - - - - - - - - - -  Pregnant, first trimetser:  0.36 - 2.50 uIU/mL)  Cardiology:  24-Nov-14 15:36   Ventricular Rate 81  Atrial Rate 81  P-R Interval 142  QRS Duration 76  QT 380  QTc 441  P Axis 42  R Axis 22  T Axis 31  ECG interpretation Normal sinus rhythm Nonspecific T wave abnormality Abnormal ECG When compared with ECG of 18-Apr-2013 10:04, No significant change was found ----------unconfirmed---------- Confirmed by OVERREAD, NOT ([id number]),  editor PEARSON, BARBARA ([id number]) on 04/21/2013 7:42:59 AM  Routine Chem:  23-Nov-14 02:59   Hemoglobin A1c ([facility]) 5.6 (The American Diabetes Association recommends that a primary goal of therapy should be <7% and that physicians should reevaluate the treatment regimen in patients with HbA1c values consistently >8%.)    05:29   Glucose, Serum  102  BUN 13  Creatinine (comp) 0.95  Sodium, Serum 137  Potassium, Serum 3.6  Chloride, Serum 105  CO2, Serum 27  Calcium (Total), Serum 8.9  Anion Gap  5  Osmolality (calc) 274  eGFR (African American) >60  eGFR (Non-African American) >60 (eGFR values <39m/min/1.73 m2 may be an indication of chronic kidney disease (CKD). Calculated eGFR is useful in patients with stable renal function. The eGFR calculation will not be reliable in acutely ill patients when serum creatinine is changing rapidly. It is not useful in  patients on dialysis. The eGFR calculation may not be applicable to patients at the low and high extremes of body sizes, pregnant women, and vegetarians.)  Result Comment TROPONIN - RESULTS VERIFIED BY REPEAT TESTING.  - LAB/DOLL FERGUSON AT 1051 04/19/13  - READ-BACK PROCESS PERFORMED.  Result(s) reported on 19 Apr 2013 at 10:53AM.  Result Comment LDL/VLDL -  Unable to report VLDL and LDL due to a  - Triglyceride value that is 400 mg/dL or   - greater.  Result(s) reported on 19 Apr 2013 at 06:23AM.  Cholesterol, Serum 192  Triglycerides, Serum  416  HDL (INHOUSE)  31  VLDL Cholesterol Calculated SEE COMMENT  LDL Cholesterol Calculated SEE COMMENT  Cardiac:  23-Nov-14 05:29   CPK-MB, Serum  4.6 (Result(s) reported on 19 Apr 2013 at 10:45AM.)  Troponin I  2.00 (0.00-0.05 0.05 ng/mL or less: NEGATIVE  Repeat testing in 3-6 hrs  if clinically indicated. >0.05 ng/mL: POTENTIAL  MYOCARDIAL INJURY. Repeat  testing in 3-6 hrs if  clinically indicated. NOTE: An increase or decrease  of 30% or more on serial  testing suggests a   clinically important change)  Routine Coag:  23-Nov-14 05:29   Activated PTT (APTT)  59.6 (A HCT value >55% may artifactually increase the APTT. In one study, the increase was an average of 19%. Reference: "Effect on Routine and Special Coagulation Testing Values of Citrate Anticoagulant Adjustment in Patients with High HCT Values." American Journal of Clinical Pathology 2006;126:400-405.)    14:31   Activated PTT (APTT)  46.5 (A HCT value >55% may artifactually increase the APTT. In one study, the increase was an average of 19%. Reference: "Effect on Routine and Special Coagulation Testing Values of Citrate Anticoagulant Adjustment in Patients with High HCT Values." American Journal of Clinical Pathology 2006;126:400-405.)    21:16   Activated PTT (APTT)  56.9 (A HCT value >55% may artifactually increase the APTT. In one study, the increase was an average of 19%. Reference: "Effect on Routine and Special Coagulation Testing Values of Citrate Anticoagulant Adjustment in Patients with High HCT Values." American Journal of Clinical Pathology 2006;126:400-405.)  24-Nov-14 04:39   Activated PTT (APTT)  71.0 (A HCT value >55% may artifactually increase the APTT. In one study, the increase was an average of 19%. Reference: "Effect on Routine and Special Coagulation Testing Values of Citrate Anticoagulant Adjustment in Patients with High HCT Values." American Journal of Clinical Pathology 2006;126:400-405.)  Routine Hem:  24-Nov-14 04:39   Hemoglobin (CBC) 15.1 (Result(s) reported on 20 Apr 2013 at 05:09AM.)  Platelet Count (CBC) 265 (Result(s) reported on 20 Apr 2013 at [facility]   Radiology Results: XRay:    22-Nov-14 10:27, Chest PA and Lateral  Chest PA and Lateral   REASON FOR EXAM:    chest pressure, dyspnea, hypertension  COMMENTS:       PROCEDURE: DXR - DXR CHEST PA (OR AP) AND LATERAL  - Apr 18 2013 10:27AM     CLINICAL DATA:  Dyspnea and headache    EXAM:  CHEST  2  VIEW    COMPARISON:  None.    FINDINGS:  The heart size and mediastinal contours are within normal limits.  Both lungs are clear. The visualized skeletal structures are  unremarkable.     IMPRESSION:  No active cardiopulmonary disease.      Electronically Signed    By: Kerby Moors M.D.    On: 04/18/2013 10:28         Verified By: Angelita Ingles, M.D.,  Cardiology:    22-Nov-14 10:04, ECG  Ventricular Rate 85  Atrial Rate 85  P-R Interval 126  QRS Duration 72  QT 366  QTc 435  P Axis 44  R Axis 24  T Axis -5  ECG interpretation   Normal sinus rhythm  Possible Inferior infarct , age undetermined  Abnormal ECG  No previous ECGs available  ----------unconfirmed----------  Confirmed by OVERREAD, NOT ([id number]), editor PEARSON, BARBARA ([id number]) on 04/20/2013 1:17:40 PM  ECG     22-Nov-14 15:08, Echo Doppler  Echo Doppler   REASON FOR EXAM:      COMMENTS:       PROCEDURE: [facility] - ECHO DOPPLER COMPLETE(TRANSTHOR)  - Apr 18 2013  3:08PM     RESULT: Echocardiogram Report    Patient Name:   [name] Date of Exam: 04/18/2013  Medical Rec #:  [id number]               Custom1:  Date of Birth:  10/13/1980            Height:       60.0 in  Patient Age:    31 years             Weight:       170.0 lb  Patient Gender: F                    BSA:          1.74 m??    Indications: Elevated Tropin  Sonographer:    Arville Go RDCS  Referring Phys: Harvest Dark, A    Summary:   1. Left ventricular ejection fraction, by visual estimation, is 60 to   65%.   2. Normal global left ventricular systolic function.   3. Moderate concentric left ventricular hypertrophy.   4.Moderately increased left ventricular septal thickness.   5. Decreased left ventricular internal cavity size.   6. Mild mitral valve regurgitation.   7. Severely increased left ventricular posterior wall thickness.   8. Mild tricuspid regurgitation.  2D AND M-MODE MEASUREMENTS (normal ranges within  parentheses):  Left Ventricle:          Normal  IVSd (2D):      1.78 cm (0.7-1.1)  LVPWd (2D):     1.52 cm (0.7-1.1) Aorta/LA:                  Normal  LVIDd (2D):     2.75 cm (3.4-5.7) Aortic Root (2D): 2.80 cm (2.4-3.7)  LVIDs (2D):     1.81 cm           Left Atrium (2D): 2.90 cm (1.9-4.0)  LV FS (2D):     34.2 %   (>25%)  LV EF (2D):     65.1 %   (>50%)                                    Right Ventricle:                                    RVd (2D):  LV DIASTOLIC FUNCTION:  MV Peak E: 0.64 m/s Decel Time: 190 msec  MV Peak A: 0.67 m/s  E/A Ratio: 0.96  SPECTRAL DOPPLER ANALYSIS (where applicable):  Mitral Valve:  MV P1/2 Time: 55.10 msec  MV Area, PHT: 3.99 cm??  Aortic Valve: AoV Max Vel: 1.15 m/s AoV Peak PG: 5.3 mmHg AoV Mean PG:  LVOT Vmax: 0.97 m/s LVOT VTI:  LVOT Diameter: 1.60 cm  AoV Area, Vmax: 1.69 cm?? AoV Area, VTI:  AoV Area, Vmn:  Pulmonic Valve:  PV Max Velocity: 1.07 m/s PV Max PG: 4.6 mmHg PV Mean PG:  PHYSICIAN INTERPRETATION:  Left Ventricle: The left ventricular internal cavity size was decreased.   LV septal wall thickness was moderately increased. LV posterior wall   thickness was severely increased. Moderate concentric left ventricular   hypertrophy. Global LV systolic function was normal. Left ventricular   ejection fraction, by visual estimation, is 60 to 65%.  Right Ventricle: The right ventricular size is normal. Global RV systolic   function is normal.  Left Atrium: The left atrium is normal in size.  Right Atrium: The right atrium is normal in size.  Pericardium: There is no evidence of pericardial effusion.  Mitral Valve: The mitral valve is normal in structure. Mild mitral valve   regurgitation is seen.  Tricuspid Valve: The tricuspid valve is normal. Mild tricuspid   regurgitation is visualized.  Aortic Valve: The aortic valve is normal. No evidence of aortic valve   regurgitation is seen.  Pulmonic Valve: The pulmonic valve is  normal.    [place] MD  Electronically signed by [place] MD  Signature Date/Time: 04/19/2013/10:29:34 AM    *** Final ***  IMPRESSION: .        Verified By: Yolonda Kida, M.D., MD    24-Nov-14 15:36, ECG  Ventricular Rate 81  Atrial Rate 81  P-R Interval 142  QRS Duration 76  QT 380  QTc 441  P Axis 42  R Axis 22  T Axis 31  ECG interpretation   Normal sinus rhythm  Nonspecific T wave abnormality  Abnormal ECG  When compared with ECG of 18-Apr-2013 10:04,  No significant change was found  ----------unconfirmed----------  Confirmed by OVERREAD, NOT ([id number]), editor PEARSON, BARBARA ([id number]) on 04/21/2013 7:42:59 AM  ECG   CT:    22-Nov-14 13:02, CT Angiography Neck (Carotids)  CT Angiography Neck (Carotids)   REASON FOR EXAM:    neck pain; hypertension; elevated troponin  COMMENTS:       PROCEDURE: CT  - CT ANGIOGRAPHY NECK W/CONTRAST  - Apr 18 2013  1:02PM     CLINICAL DATA:  Neck tightness.  Right arm tingling.    EXAM:  CT ANGIOGRAPHY NECK    TECHNIQUE:  Multidetector CT imaging of the neck was performed using the  standard protocol during bolus administration of intravenous  contrast. Multiplanar CT image reconstructions including MIPs were  obtained to evaluate the vascular anatomy. Carotid stenosis  measurements (when applicable) are obtained utilizing NASCET  criteria, using the distal internal carotid diameter as the  denominator.    CONTRAST:  80 mL Isovue 370 IV    COMPARISON:  Negative    FINDINGS:  Negative for mass or adenopathy in the neck. Mild cervical kyphosis  is present without focal bony abnormality.    Both carotid arteries are normal. The carotid bifurcation is widely  patent. No evidence of atherosclerotic disease or dissection.  Both vertebral arteries are widely patentwithout stenosis or  dissection. Vertebral arteries are equal in size and both are patent  to the basilar.    Three vessel arch.   Proximal great vessels are widely patent.    Review of the MIP images confirms the above findings.     IMPRESSION:  Negative      Electronically Signed    By: Franchot Gallo M.D.    On: 04/18/2013 13:12     Verified By: Truett Perna, M.D.,    [phone] 13:02, CT Head Without Contrast  CT Head Without Contrast   REASON FOR EXAM:  headache  COMMENTS:       PROCEDURE: CT  - CT HEAD WITHOUT CONTRAST  - Apr 18 2013  1:02PM     CLINICAL DATA:  Headache    EXAM:  CT HEAD WITHOUT CONTRAST    TECHNIQUE:  Contiguous axial images were obtained from the base of the skull  through the vertex without intravenous contrast.    COMPARISON:  None.  FINDINGS:  Ventricle size is normal. Negative for acute or chronic infarction.  Negative for hemorrhage or fluid collection. Negative for mass or  edema. No shift of the midline structures.    Calvarium is intact.     IMPRESSION:  Normal      Electronically Signed    By: Franchot Gallo M.D.    On: 04/18/2013 13:07     Verified By: Truett Perna, M.D.,   Assessment/Plan:  Assessment/Plan:  Assessment IMP S/P PCI DES RCA NQMI [place] CAD HTN DM Smoking ETOH abuse Hyperlipidemia Obesity .   Plan PLAN ASA 81 mg po daily Plavix 75 mg daily x 1 yr B-blockers for Bp/CAD/Post MI ACE  for BP/CAD Statin lipitor for hyperlipidemia Advise to quit smoking Advise to quit ETOH Continuue DM control Exercise and wgt loss F/U 1-2 weeks Consider functional studt as outpt 2-4 weeks   Electronic Signatures: Lujean Amel D (MD)  (Signed [phone] 09:12)  Authored: Chief Complaint, VITAL SIGNS/ANCILLARY NOTES, Brief Assessment, Lab Results, Radiology Results, Assessment/Plan   Last Updated: 25-Nov-14 09:12 by Lujean Amel D (MD)

## 2014-09-17 NOTE — Discharge Summary (Signed)
PATIENT NAME:  [name], Sayed MR#:  [id number] DATE OF BIRTH:  1980-08-21  DATE OF ADMISSION:  04/18/2013 DATE OF DISCHARGE:  04/21/2013  CONSULTANTS: Dr. Juliann Pares from cardiology.   PRIMARY CARE PHYSICIAN: Dr. Cliffton Asters at [facility] in [place].   CHIEF COMPLAINT: Right-sided headache, right-sided neck pain, left-sided chest pain.   DISCHARGE DIAGNOSES: 1.  Non-ST elevation myocardial infarction status post drug-eluting stent to mid right coronary artery for 80% lesion.  2.  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  3.  Hypertriglyceridemia.  4.  Alcohol abuse.  5.  Tobacco abuse.  6.  Hypokalemia.  7.  Hypomagnesemia.  8.  Borderline diabetes.   DISCHARGE MEDICATIONS: Clopidogrel 75 mg daily starting tomorrow, trazodone 50 mg daily, benazepril/hydrochlorothiazide 20/25 mg 1 tab once a day, metoprolol tartrate 25 mg 2 times a day, aspirin 81 mg daily, atorvastatin 40 mg daily.   DIET: Low sodium, low fat, low cholesterol.   ACTIVITY: As tolerated.   Please follow with PCP and cardiologist within 1 to 2 weeks. Please stop drinking alcohol and recheck lipid profile in 3 to 4 months.   DISPOSITION: Home.   SIGNIFICANT LABS AND IMAGING: Initial BUN 11, creatinine 0.97, sodium 139, potassium 3.6. Initial troponin 0.31, peak troponin 2.7, last troponin 2. Initial CK-MB 1.3, peak CK-MB was 7.2. Last CK-MB 0.5. Triglycerides of 416, HDL 31. Hemoglobin A1c of 5.6. White count 8.8, hemoglobin 15, platelets 261 on admission. Echocardiogram on November 22 showed EF of 60% to 65% with severely increased LV posterior wall thickness, decreased LV internal cavity size, moderate concentric left ventricular hypertrophy. Cardiac catheter showing 80% lesion at mid RCA.   HISTORY OF PRESENT ILLNESS AND HOSPITAL COURSE: For full details of the H and P, please see the dictation on November 22 by Dr. Allena Katz, but briefly this is a pleasant 34 year old female with obesity, diabetes, which is borderline,  not on any medications, and hypertension, who came in for the above chief complaint and was noted to have a systolic blood pressure of 206/118. She received nitroglycerin patch and pressures did decrease, and the patient felt better. She also did have a positive troponin, and therefore was admitted to the hospitalist service. She had a CT of the head which was negative for acute stroke. This was likely secondary to accelerated malignant hypertension. She had cyclic cardiac markers, and as they did trend up, she was also started on heparin drip. She was seen by cardiology, who took her to the cath lab and found the above lesion. She currently is in the CCU chest pain free, and she will be discharged on Plavix, aspirin, beta blocker and a statin. In regards to the non-ST elevation MI, this was secondary to the RCA lesion, and she will follow up as an outpatient. She had an echocardiogram showing normal EF.  She was counseled several times about her tobacco and alcohol abuse. Her hypertriglyceridemia is likely secondary to alcohol habits, and she was recommended to stop drinking excessive alcohol. She was counseled to recheck a lipid profile as an outpatient, and she will be discharged on a statin.  Her accelerated malignant hypertension is better.  Her headache is resolved, and she has no left-sided chest pain. The patient had low potassium and magnesium and they were repleted.  On the day of discharge, temperature was 98.4, pulse rate 96, respiratory rate 20, blood pressure 153/84, O2 sat 98% on room air.  Generally, the patient is an obese female sitting in bed in no obvious distress.  Normocephalic, atraumatic.  Lungs are clear. She has a normal S1 and S2 without any significant murmurs.  Abdomen was soft, nontender. Extremities show no significant edema.  At this point, she will be discharged with the above medications. Consider a functional study as an outpatient in 2 to 4 weeks.   Total Time Spent: 40  minutes.   The patient is FULL CODE.  ____________________________ [name] Augustine Leverette, MD sa:dmm D: 04/21/2013 12:45:00 ET T: 04/21/2013 13:04:07 ET JOB#: [id number]  cc: [name] Monica Couse, MD, <Dictator> [name] Jden Want MD ELECTRONICALLY SIGNED 05/04/2013 20:02

## 2014-09-18 NOTE — Consult Note (Signed)
PATIENT NAME:  COUSIVerdon Cummins, Lyndzee H MR#:  [id number] DATE OF BIRTH:  1981/04/22  DATE OF CONSULTATION:  04/18/2013  REFERRING PHYSICIAN:  [facility], Dr. Eliane DecreeS. Patel CONSULTING PHYSICIAN:  Reyanna Baley D. Juliann Paresallwood, MD  PRIMARY PHYSICIAN:  [facility].   INDICATION: Left-sided chest pain, right-sided headache and neck pain.   HISTORY OF PRESENT ILLNESS: [name] is a 34 year old African American female  with history of obesity who presents with diabetes, hypertension, reportedly has hyperlipidemia who reports earlier that she developed right-sided headache of a sharp nature.  The pain went down her shoulder as well as up her neck.  She started having symptoms which were pretty severe and eventually had blood pressure systolic of over [id number]. Patient received nitroglycerin and blood pressure came back down. She also had left-sided chest pressure which eventually went away. She denied having symptoms of this before. She got short of breath, came to the Emergency Room. Initial troponin was elevated at 2.31 so she was admitted for further evaluation and care.   PAST MEDICAL HISTORY:  Diabetes, hypertension, hyperlipidemia, smoker.   PAST SURGICAL HISTORY: Surgical resection of lesions under arms.   ALLERGIES: None.   MEDICATIONS: Benazepril/HCTZ 20/25 once a day, trazodone 50 mg once a day at bedtime to help with sleep.   SOCIAL HISTORY: Smoker with occasional alcohol consumption.   FAMILY HISTORY: Coronary artery disease, coronary bypass, cardiomyopathy,   ALLERGIES: None.   REVIEW OF SYSTEMS: No blackout spells or syncope. Denies nausea, vomiting. No fever, no chills, no sweats. No weight loss, no weight gain.  No hemoptysis, hematemesis. Denies bright red blood per rectum. No vision change or hearing change. No sputum production or cough. She has had neck pain, headache, chest pain, otherwise negative.   PHYSICAL EXAMINATION: VITAL SIGNS: Blood pressure was initially 200/110.  Followup blood pressure was 150/110, pulse 90, respiratory rate 18, afebrile.  HEENT: Normocephalic, atraumatic. Pupils equal and reactive to light.  NECK:  Supple, no JVD, bruits, or adenopathy.  LUNGS: Clear to auscultation and percussion. No significant wheeze, rhonchi or rale.  HEART: Regular rate and rhythm. Positive S4. Systolic ejection murmur at the apex.  ABDOMEN: Benign.  EXTREMITIES: Within normal limits.  NEUROLOGIC: Intact.  SKIN: Normal   LABORATORIES:  EKG normal sinus rhythm, nondiagnostic Q waves inferiorly, nonspecific ST-T wave changes. Troponin was 0.31.  Labs were otherwise unremarkable.   ASSESSMENT:  Chest pain, abnormal EKG, headache, neck pain, malignant hypertension, smoking, diabetes, hypercholesterolemia.   PLAN:  1.  Agree with admit. Rule out for myocardial infarction. Follow cardiac enzymes, follow  troponins, followup EKG. Continue telemetry. Nitroglycerin as necessary. Continue aspirin. Recommend anticoagulation until we are sure she has been ruled out.  Also recommend deep vein thrombosis prophylaxis.  2.  Advised the patient to quit smoking. 3.  Continue hypertension control with benazepril. Consider clonidine or beta blocker or Norvasc to help keep her blood pressure under 140 systolic and under 90 diastolic.  4.  Consider hyperlipidemia therapy.  5.  Continue diabetes monitoring and therapy as necessary.  6.  Continue to evaluate for headache, see if this is related to her hypertension. I am not sure what her neck pain is from. She may need further evaluation by orthopedic or neurology including CT. Continue current evaluation and see how she responds. Echocardiogram may be helpful. Do not recommend cardiac catheterization at this point unless troponins continue to rise. Again, nitroglycerin could be helpful for her chest pain. It may make her headache worse.  7.  Advised the patient to refrain from alcohol consumption as well as smoking.    ____________________________ Bobbie Stack. Juliann Pares, MD ddc:cs D: 04/19/2013 09:58:54 ET T: 04/19/2013 15:54:37 ET JOB#: [id number]  cc: Harden Bramer D. Juliann Pares, MD, <Dictator> Alwyn Pea MD ELECTRONICALLY SIGNED 06/03/2013 10:23

## 2014-09-18 NOTE — Consult Note (Signed)
PATIENT NAME:  [name], Lorrin H MR#:  [id number] DATE OF BIRTH:  Jan 17, 1981  DATE OF CONSULTATION:  06/21/2013  REFERRING PHYSICIAN:  Dr. Amado CoeGouru CONSULTING PHYSICIAN:  Lamar BlinksBruce J. Kayanna Mckillop, MD  REASON FOR CONSULTATION: Acute syncope.   CHIEF COMPLAINT: "I passed out."   HISTORY OF PRESENT ILLNESS: This 34 year old female with known coronary artery disease, status post PCI and stent placement of right coronary artery in 03/2013, who had readmission recently for atypical chest discomfort and other significant symptoms concerning for cardiovascular disease having an episode of these symptoms requiring further intervention and that included a cardiac catheterization showing patent stent and no other significant cardiovascular disease. The patient did have resolution of those symptoms. Then she was placed on appropriate medication management including beta blocker, ACE inhibitor and statin. At this time she has had an episode of syncope for which she felt kind of dizzy, mildly abnormal, but then had full syncope and did not hurt herself. Her family and friends said that she was out for a few moments and then finally improved with resolution of syncope. There was no chest pain or other symptoms at that time. The patient did have admission to the hospital with an EKG showing normal sinus rhythm with nonspecific ST changes but no evidence of other significant symptoms. She did have an elevated troponin of unknown etiology, currently thought to be demand ischemia.   REVIEW OF SYSTEMS: The remainder of review of systems negative for vision change, ringing in the ears, hearing loss, cough, congestion, heartburn, nausea, vomiting, diarrhea, bloody stools, stomach pain, extremity pain, leg weakness, cramping of the buttocks, known blood clots, headaches, blackouts, dizzy spells, nosebleeds, congestion, trouble swallowing, frequent urination, urination at night, muscle weakness, numbness, anxiety, depression, skin lesions or  skin rashes.   PAST MEDICAL HISTORY: 1. Coronary artery disease, status post stent placement.  2. Hypertension.  3. Hyperlipidemia.  4. Diabetes.   FAMILY HISTORY: No family members with early onset of cardiovascular disease or hypertension.   SOCIAL HISTORY: Currently denies alcohol or tobacco use.   ALLERGIES: As listed.   MEDICATIONS: As listed.   PHYSICAL EXAMINATION: VITAL SIGNS: Blood pressure 122/68 bilaterally, heart rate is 62 upright, reclining, and regular.  GENERAL: She is a well appearing female in no acute distress.  HEENT: No icterus, thyromegaly, ulcers, hemorrhage, or xanthelasma.  CARDIOVASCULAR: Regular rate and rhythm. Normal S1 and S2 without murmur, gallop, or rub. PMI is normal size and placement. Carotid upstroke normal without bruit. Jugular venous pressure is movable.  LUNGS: Clear to auscultation with normal respirations.  ABDOMEN: Soft, nontender, without hepatosplenomegaly or masses.   normal size without bruit.  EXTREMITIES: 2+ bilateral pulses in dorsal, pedal, radial and femoral arteries without lower extremity edema, cyanosis, clubbing or ulcers.  NEUROLOGIC: She is oriented to time, place, and person, with normal mood and affect.   ASSESSMENT: This is a 34 year old female with coronary artery disease, hypertension, hyperlipidemia, diabetes, normal LV function with ejection fraction of 60%, elevated troponin with acute syncope of unknown etiology.   RECOMMENDATIONS: 1. Continue telemetry and telemetry unit nursing following for any new significant symptoms, syncope and/or bradycardia, heart block. 2. Ambulation with above. Following for any worsening significant symptoms and/or heart block requiring adjustments of medication management.  3. Further consideration of walking treadmill test and/or stress test for chronotropic incompetence, induction of rhythm disturbances or other concerns requiring further adjustments of medications.  4. Possible  decrease dose of metoprolol which may be causing above.  5. Continue statin,  aspirin and Plavix for further risk reduction of in-stent abnormalities.  6. Further treatment options after above.  ____________________________ Lamar Blinks, MD bjk:sg D: 06/21/2013 07:19:32 ET T: 06/21/2013 11:05:59 ET JOB#: [id number]   cc: Lamar Blinks, MD, <Dictator> Lamar Blinks MD ELECTRONICALLY SIGNED 07/03/2013 9:49

## 2014-09-18 NOTE — H&P (Signed)
PATIENT NAME:  [name], Sinkler MR#:  [id number] DATE OF BIRTH:  26-Jun-1980  DATE OF ADMISSION:  01/08/2014  REFERRING PHYSICIAN: Dr. Manson Passey.   PRIMARY CARE PHYSICIAN: [facility].  CARDIOLOGY:  [facility].   CHIEF COMPLAINT: Chest pain.  HISTORY OF PRESENT ILLNESS: This is a 34 year old African American female with past medical history of coronary artery disease status post PCI with stent placement with drug eluting stents to the RCA back in November 2014 presenting with chest pain. Describes 1 day duration chest pain, retrosternal location described only as "pain", 6/10 intensity, nonradiating, no associated symptoms apparently somewhat positional, worsened when she was lying on her side and improving when she is lying on her back. This has been been intermittent at best. She also complains of intermittent lightheadedness. However, there were no further symptomatology. No chest pain currently.   REVIEW OF SYSTEMS:   CONSTITUTIONAL: Denies fevers, chills, fatigue, weakness.  EYES: Denies blurred vision, double vision or eye pain.  EARS, NOSE, THROAT: Denies any tinnitus, ear pain, hearing loss.  RESPIRATORY: Denies coughing or shortness of breath.  CARDIOVASCULAR: Positive for chest pain as described above. Denies any palpitations or edema.  GASTROINTESTINAL: Denies nausea, vomiting, diarrhea and abdominal pain. GENITOURINARY: Denies dysuria, hematuria.  ENDOCRINE: Denies nocturia or thyroid problems.  HEMATOLOGIC: Denies easy bruising or bleeding.   SKIN: Denies rash or lesion.   MUSCULOSKELETAL: Denies pain in neck, back, shoulder, knees, hips or arthritic symptoms.  NEUROLOGICAL: There is no paralysis or paresthesias.  PSYCHIATRIC: Denies any anxiety or depressive symptoms.  Otherwise, full review of systems performed by me is negative.   PAST MEDICAL HISTORY: Coronary artery disease status post PCI and stent placement.  Drug-eluting stent to the RCA back in November 2014, hypertension as well as borderline diabetic on no medications.   SOCIAL HISTORY: Positive for tobacco as well as alcohol usage. Denies any drug usage.   FAMILY HISTORY: Positive for early onset coronary artery disease.   ALLERGIES: No known drug allergies.   HOME MEDICATIONS: Aspirin 81 mg p.o. q. daily, Bentyl 20 mg p.o. q. daily, Nitrostat 0.4 mg p.o. q. 5 minutes as needed for chest pain, atorvastatin 20 mg 2 tabs p.o. at bedtime, Plavix 75 mg p.o. q. daily, metoprolol 50 mg p.o. b.i.d., Norvasc 5 mg p.o. q. daily, hydrochlorothiazide 25 mg p.o. q. daily.   PHYSICAL EXAMINATION:  VITAL SIGNS: Temperature 98, heart rate 91, respirations 18, blood pressure 162/93 saturating 96% on room air. Weight 76.2 kg, BMI of 32.8.  GENERAL: Well-nourished, well-developed, African American female, currently in no acute distress.  HEAD: Normocephalic, atraumatic.  EYES: Pupils equal, round and reactive to light. Extraocular muscles are intact. No scleral icterus.   MOUTH: Moist mucous membranes. Dentition is intact.  No abscess noted.   EARS, NOSE, AND THROAT: Clear without exudates. No external lesions.  NECK: Supple. No thyromegaly. No nodules. No JVD.  PULMONARY: Clear to auscultation bilaterally without wheezes, rales or rhonchi.  No use of accessory muscles. Good respiratory effort. CHEST:  Nontender on palpation.   CARDIOVASCULAR: S1, S2, regular rate and rhythm. No murmurs, rubs, or gallops. No edema. Pedal pulses 2+ bilaterally.  GASTROINTESTINAL: Soft, nontender, nondistended. No masses. Positive bowel sounds. No hepatosplenomegaly.  MUSCULOSKELETAL: No swelling, clubbing, or edema. Range of motion is full in all extremities. NEUROLOGIC: Cranial nerves II-XII are intact. No gross focal neurological deficits. Sensation intact. Reflexes intact.  SKIN: No ulcerations, lesions, no rashes,  or cyanosis. Skin warm and dry. Turgor intact. PSYCHIATRIC:  Mood and affect within normal limits.  The patient is awake, alert and oriented x 3.  Insight and judgment intact.   LABORATORY DATA: EKG performed reveals normal sinus rhythm. No ST or T wave abnormalities. Remainder of laboratory data: Sodium 136, potassium 3.4, chloride 102, bicarbonate 29, BUN 19, creatinine 0.86, glucose 95. Troponin less than 0.02. WBC 10.8, hemoglobin 15.5, platelets 266,000.  D-dimer 436. Chest x-ray performed reveals no acute cardiopulmonary process. Had an ultrasound of the right lower extremity reveals no deep venous thrombosis.    ASSESSMENT AND PLAN: A 34 year old African American female with history of coronary artery disease who presented with chest pain as well ashe admits to medical noncompliance.   1. Chest pain. Admit to telemetry under observational status. Initiate aspirin and statin therapy. Trend cardiac enzymes x 3. Consult cardiology. [facility], but has  previously seen the [facility].  2. Hypokalemia. Replace potassium, goal of 4 to 5. 3. Hypertension.  Continue with Norvasc and Lopressor.   4. Venous thromboembolism prophylaxis with heparin subcutaneously.   CODE STATUS: The patient is a full code.   TIME SPENT: 45 minutes.    ____________________________ Cletis Athensavid K. Colburn Asper, MD dkh:JT D: 01/08/2014 01:45:43 ET T: 01/08/2014 02:06:09 ET JOB#: [id number]  cc: Cletis Athensavid K. Maclean Foister, MD, <Dictator> Wayne Brunker Synetta ShadowK Charlayne Vultaggio MD ELECTRONICALLY SIGNED 01/08/2014 23:06

## 2014-09-18 NOTE — Discharge Summary (Signed)
PATIENT NAME:  [name], [name] MR#:  [id number] DATE OF BIRTH:  1981-02-12  DATE OF ADMISSION:  06/21/2013 DATE OF DISCHARGE:  06/22/2013  DISCHARGE DIAGNOSES: 1. Syncope, likely vasovagal and the patient has hypokalemia, hypomagnesemia, resolved. 2.  Hypertension.  3. Coronary artery disease.  4. Hyperlipidemia.   DISCHARGE MEDICATIONS: Aspirin 81 mg daily, (citalopram   10 mg p.o. daily, atorvastatin 20 mg p.o. daily Plavix 75 mg p.o. daily, metoprolol 50 mg p.o. b.i.d. (benzapril is decreased to 20 mg daily from 40 and advised to stop HCTZ.   DIET:  Low sodium diet.   CONSULTATIONS: Cardiology consult with Dr. Gwen PoundsKowalski.    HOSPITAL COURSE: This is a 34 year old female patient who came in because of syncope.  Look at history and physical for full details. She was admitted syncope evaluation and admitted to telemetry. She did not have chest pain, but did have hypokalemia and hypomagnesemia. The patient's troponins have been negative x 3. The patient's potassium was 3 on admission and also magnesium was 1.7 which are replaced. The patient did not have any orthostatic changes.di not  have bradycardia.>>  . The patient seen by cardiology, Dr. Gwen PoundsKowalski. He did not recommend any further work-up' however,  the patient's blood pressure was 107/50 so I cut down the lisinopril to 20 mg daily from 40 mg and also continued the metoprolol at the same dose, but stopped the hydrochlorothiazide, and the patient advised to check the blood pressure at home. For the syncope she also had a carotid ultrasound, which did not show any significant stenosis and she also had a CT angio chest which showed no PE. The patient's symptoms improved, did not have orthostatic changes. Potassium back to 3.5 The patient was taking hydrochlorothiazide but no potassium supplement so probably caused her syncope. The hypokalemia might have caused her syncope, and the patient advised to continue her metoprolol but decrease the lisinopril   and stop the HCTZ. The patient feels fine. She wants to go home today.   TIME SPENT ON DISCHARGE PREPARATION: More than 30 minutes.   Blood pressure 120/83 today and heart rate 72. The patient is going to see Dr. Gwen PoundsKowalski in 10 days and primary doctor,  Clance BollLiz White.  ____________________________ Katha HammingSnehalatha Tamla Winkels, MD sk:sg D: 06/22/2013 11:01:38 ET T: 06/22/2013 13:43:48 ET JOB#: [id number]  cc: Katha HammingSnehalatha Jaysun Wessels, MD, <Dictator> Courtney ParisElizabeth B. WoodsideWhite, FNP Katha HammingSNEHALATHA Timberlynn Kizziah MD ELECTRONICALLY SIGNED 07/06/2013 13:50

## 2014-09-18 NOTE — Consult Note (Signed)
PATIENT NAME:  [name], [name] MR#:  [id number] DATE OF BIRTH:  15-Aug-1980  DATE OF CONSULTATION:  06/16/2013  CONSULTING PHYSICIAN:  Marcina MillardAlexander Shykeem Resurreccion, MD  PRIMARY CARE PHYSICIAN:   Doristine [facility]angoElizabeth [facility]  PRIMARY CARDIOLOGIST:  Dr. Juliann Paresallwood.   CHIEF COMPLAINT: Chest pain.   HISTORY OF PRESENT ILLNESS: The patient is a 34 year old female with known coronary artery disease admitted with chest pain and borderline elevated troponin. The patient underwent drug-eluting stent of the left circumflex coronary artery following non-ST elevation myocardial infarction in November 2014. The patient took aspirin and clopidogrel for a month but never refilled her medications. The patient has been off of dual antiplatelet therapy for at least a month. She presents with substernal chest discomfort with radiation to the left shoulder. She also experienced elevated blood pressure. She has also had an episode of weakness and a shooting discomfort behind her right eye and to the back of her head. EKG was nondiagnostic. Admission labs were notable for elevated troponin of 1.0.   PAST MEDICAL HISTORY: 1.  Status post non-ST-elevation myocardial infarction with a drug-eluting stent of mid left circumflex coronary artery on 04/20/2013.  2.  Hypertension.  3.  Hyperlipidemia.  4.  Tobacco abuse.  5.  Chronic obstructive pulmonary disease.   MEDICATIONS: The patient currently not taking aspirin. The patient currently not taking clopidogrel.  Benazepril/hydrochlorothiazide 20/25 1/2 tablet daily, metoprolol 50 mg b.i.d.   SOCIAL HISTORY: The patient is separated. She currently smokes a half-pack of cigarettes a day.   FAMILY HISTORY: Positive for coronary artery disease, cardiomyopathy, and congestive heart failure.   REVIEW OF SYSTEMS:  CONSTITUTIONAL: No fever or chills.  EYES: No blurry vision.  EARS: No hearing loss.  RESPIRATORY: No shortness of breath.  CARDIOVASCULAR: Chest pain as  described above.  GASTROINTESTINAL: No nausea, vomiting, or diarrhea.  GENITOURINARY: No dysuria or hematuria.  ENDOCRINE: No polyuria or polydipsia.  MUSCULOSKELETAL: No arthralgias or myalgias.  NEUROLOGICAL: No focal muscle weakness or numbness.  PSYCHOLOGICAL: No depression or anxiety.   PHYSICAL EXAMINATION: VITAL SIGNS: Blood pressure 153/98, pulse 74, respirations 20, temperature 98.3, pulse oximetry 97%.  HEENT: Pupils equal and reactive to light and accommodation.  NECK: Supple without thyromegaly.  LUNGS: Clear.  CARDIOVASCULAR: Normal JVP. Normal PMI. Regular rate and rhythm. Normal S1, S2. No appreciable gallop, murmur, or rub.  ABDOMEN: Soft and nontender. Pulses were intact bilaterally.  MUSCULOSKELETAL: Normal muscle tone.  NEUROLOGIC: The patient is alert and oriented x 3. Motor and sensory both grossly intact.   IMPRESSION: This is a 34 year old female with recent non-ST elevation myocardial infarction status post coronary stent mid left circumflex. The patient has been off of dual antiplatelet therapy for at least a month who presents with a recurrent chest pain with elevated troponin consistent with non-ST elevation myocardial infarction.   RECOMMENDATIONS: 1.  Agree with overall current therapy.  2.  Continue heparin drip.  3.  Proceed with cardiac catheterization with selective coronary arteriography on 06/17/2013. The risks, benefits, and alternatives were explained and informed written consent obtained   ____________________________ Marcina MillardAlexander Jhan Conery, MD ap:cc D: 06/16/2013 17:00:34 ET T: 06/16/2013 18:12:53 ET JOB#: [id number]  cc: Marcina MillardAlexander Kamrin Sibley, MD, <Dictator> Marcina MillardALEXANDER Iren Whipp MD ELECTRONICALLY SIGNED 07/14/2013 12:29

## 2014-09-18 NOTE — Discharge Summary (Signed)
Dates of Admission and Diagnosis:  Date of Admission 08-Jan-2014   Date of Discharge 08-Jan-2014   Admitting Diagnosis chest pain   Final Diagnosis CAD- s/p stent- no meds for last 2 weeks. Htn Hyperlipidmeia Active smoker.    Chief Complaint/History of Present Illness a 34 year old African American female with past medical history of coronary artery disease status post PCI with stent placement with drug eluting stents to the RCA back in November 2014 presenting with chest pain. Describes 1 day duration chest pain, retrosternal location described only as "pain", 6/10 intensity, nonradiating, no associated symptoms apparently somewhat positional, worsened when she was lying on her side and improving when she is lying on her back. This has been been intermittent at best. She also complains of intermittent lightheadedness. However, there were no further symptomatology. No chest pain currently.   Allergies:  No Known Allergies:   Routine Chem:  13-Aug-15 23:28   Glucose, Serum 95  BUN  19  Creatinine (comp) 0.86  Sodium, Serum 136  Potassium, Serum  3.4  Chloride, Serum 102  CO2, Serum 29  Calcium (Total), Serum 9.9  Anion Gap  5  Osmolality (calc) 274  eGFR (African American) >60  eGFR (Non-African American) >60 (eGFR values <79m/min/1.73 m2 may be an indication of chronic kidney disease (CKD). Calculated eGFR is useful in patients with stable renal function. The eGFR calculation will not be reliable in acutely ill patients when serum creatinine is changing rapidly. It is not useful in  patients on dialysis. The eGFR calculation may not be applicable to patients at the low and high extremes of body sizes, pregnant women, and vegetarians.)  14-Aug-15 07:37   Cholesterol, Serum  214  Triglycerides, Serum  280  HDL (INHOUSE) 42  VLDL Cholesterol Calculated  56  LDL Cholesterol Calculated  116 (Result(s) reported on 08 Jan 2014 at 09:08AM.)  Cardiac:  13-Aug-15 23:28    Troponin I < 0.02 (0.00-0.05 0.05 ng/mL or less: NEGATIVE  Repeat testing in 3-6 hrs  if clinically indicated. >0.05 ng/mL: POTENTIAL  MYOCARDIAL INJURY. Repeat  testing in 3-6 hrs if  clinically indicated. NOTE: An increase or decrease  of 30% or more on serial  testing suggests a  clinically important change)  14-Aug-15 07:37   Troponin I < 0.02 (0.00-0.05 0.05 ng/mL or less: NEGATIVE  Repeat testing in 3-6 hrs  if clinically indicated. >0.05 ng/mL: POTENTIAL  MYOCARDIAL INJURY. Repeat  testing in 3-6 hrs if  clinically indicated. NOTE: An increase or decrease  of 30% or more on serial  testing suggests a  clinically important change)  CK, Total 75 (26-192 NOTE: NEW REFERENCE RANGE  06/29/2013)  CPK-MB, Serum  < 0.5 (Result(s) reported on 08 Jan 2014 at 08:06AM.)  Routine Coag:  13-Aug-15 23:28   D-Dimer, Quantitative 436 (INTERPRETATION <> Exclusion of Venous Thromboembolism (VTE) - OUTPATIENT ONLY       (Emergency Department or [place])             0-499 ng/ml (FEU)  : With a low to intermediate pretest                                  probability for VTE this test result                                  excludes the diagnosis of VTE.             >  499 ng/ml (FEU)  : VTE not excluded; additional work up                                  for VTE is required. <> Testing on Inpatients and Evaluation of Disseminated Intravascular        Coagulation (DIC)             Reference Range:  0-499 ng/ml (FEU))  Routine Hem:  13-Aug-15 23:28   WBC (CBC) 10.8  RBC (CBC) 5.08  Hemoglobin (CBC) 15.5  Hematocrit (CBC) 45.6  Platelet Count (CBC) 266 (Result(s) reported on 07 Jan 2014 at 11:51PM.)  MCV 90  MCH 30.4  MCHC 33.9  RDW 13.8   PERTINENT RADIOLOGY STUDIES: XRay:    13-Aug-15 23:02, Chest PA and Lateral  Chest PA and Lateral   REASON FOR EXAM:    weakness  COMMENTS:       PROCEDURE: DXR - DXR CHEST PA (OR AP) AND LATERAL  - Jan 07 2014 11:02PM     CLINICAL  DATA:  Dizziness and weakness.  Shortness of breath.    EXAM:  CHEST  2 VIEW    COMPARISON:  Chest x-ray 06/21/2013.    FINDINGS:  Lung volumes are normal. No consolidative airspace disease. No  pleural effusions. No pneumothorax. No pulmonary nodule or mass  noted. Pulmonary vasculature and the cardiomediastinal silhouette  are within normal limits.     IMPRESSION:  No radiographic evidence of acute cardiopulmonary disease.      Electronically Signed    By: Vinnie Langton M.D.    On: 01/07/2014 23:58         Verified By: Etheleen Mayhew, M.D.,  [place]:    14-Aug-15 00:47, [place] Color Flow Doppler Lower Extrem Right (Leg)  [place] Color Flow Doppler Lower Extrem Right (Leg)   REASON FOR EXAM:    Pain  COMMENTS:       PROCEDURE: [place]  - US DOPPLER LOW EXTR RIGHT  - Jan 08 2014 12:47AM     CLINICAL DATA:  Right leg pain.    EXAM:  RIGHT LOWER EXTREMITY VENOUS DOPPLER ULTRASOUND    TECHNIQUE:  Gray-scale sonography with gradedcompression, as well as color  Doppler and duplex ultrasound were performed to evaluate the lower  extremity deep venous systems from the level of the common femoral  vein and including the common femoral, femoral, profunda femoral,  popliteal and calf veins including the posterior tibial, peroneal  and gastrocnemius veins when visible. The superficial great  saphenous vein was also interrogated. Spectral Doppler was utilized  to evaluate flow at rest and with distal augmentation maneuvers in  the common femoral, femoral and popliteal veins.    COMPARISON:  No priors.    FINDINGS:  Common Femoral Vein: No evidence of thrombus. Normal  compressibility, respiratory phasicity and response to augmentation.    Saphenofemoral Junction: No evidence of thrombus. Normal  compressibility and flow on color Doppler imaging.  Profunda Femoral Vein: No evidence of thrombus. Normal  compressibility and flow on color Doppler imaging.    Femoral Vein: No evidence  of thrombus. Normal compressibility,  respiratory phasicity and response to augmentation.    Popliteal Vein: No evidence of thrombus. Normal compressibility,  respiratory phasicity and response to augmentation.    Calf Veins: No evidence of thrombus. Normal compressibility and flow  on color Doppler imaging.    Superficial Great Saphenous Vein: No  evidence of thrombus. Normal  compressibility and flow on color Doppler imaging.  Venous Reflux:  None.    Other Findings:  None.     IMPRESSION:  No evidence of right lower extremity deep venous thrombosis.      Electronically Signed    By: Vinnie Langton M.D.    On: 01/08/2014 01:35         Verified By: Etheleen Mayhew, M.D.,   Pertinent Past History:  Pertinent Past History Coronary artery disease status post PCI and stent placement. Drug-eluting stent to the RCA back in November 2014, hypertension as well as borderline diabetic on no medications   Hospital Course:  Hospital Course A 34 year old [place] American female with history of coronary artery disease who presented with chest pain as well ashe admits to medical noncompliance.   * Chest pain.     telemetry, troponin negative.     start asa, plavix, statin, metoprolol- CM to assist with meds.   Consult cardiology. no further work up suggested.   She follows in [place], no insurance now.  * Hypokalemia. Replace potassium, goal of 4 to 5. * Htn- Amlodipin, Metoprolol. * hyperlipidemia- statin. * Smoking- smoking cessation councelling done for 4 min, she agreed to try- does not want nicotin patch.   Condition on Discharge Stable   Code Status:  Code Status Full Code   DISCHARGE INSTRUCTIONS HOME MEDS:  Medication Reconciliation: Patient's Home Medications at Discharge:     Medication Instructions  lisinopril 20 mg oral tablet  1 tab(s) orally once a day   nitrostat 0.4 mg sublingual tablet  1 tab(s) sublingual every 5 minutes, As Needed - for Chest  Pain- max 3 in a day.   atorvastatin 20 mg oral tablet  2 tab(s) orally once a day (at bedtime)   aspirin 81 mg oral delayed release tablet  1 tab(s) orally once a day   clopidogrel 75 mg oral tablet  1 tab(s) orally once a day   metoprolol tartrate 50 mg oral tablet  1 tab(s) orally every 12 hours   amlodipine 5 mg oral tablet  1 tab(s) orally once a day    STOP TAKING THE FOLLOWING MEDICATION(S):    benazepril 20 mg oral tablet: 1 tab(s) orally once a day hydrochlorothiazide 25 mg oral tablet: 1 tab(s) orally once a day  Physician's Instructions:  Diet Low Sodium  Low Fat, Low Cholesterol   Activity Limitations As tolerated   Return to Work Not Applicable   Time frame for Follow Up Appointment 1-2 weeks  PMD   Other Comments routine follwo ups with PMD.   Electronic Signatures: Vaughan Basta (MD)  (Signed 18-Aug-15 21:01)  Authored: ADMISSION DATE AND DIAGNOSIS, CHIEF COMPLAINT/HPI, Allergies, PERTINENT LABS, PERTINENT RADIOLOGY STUDIES, PERTINENT PAST HISTORY, HOSPITAL COURSE, DISCHARGE INSTRUCTIONS HOME MEDS, PATIENT INSTRUCTIONS   Last Updated: 18-Aug-15 21:01 by Vaughan Basta (MD)

## 2014-09-18 NOTE — Consult Note (Signed)
   Present Illness 34 year old [facility] with history of coronary disease.  She was admitted after being off of all over medications for approximately 2 weeks due to financial reasons.  She presented to the emergency room with complaints of chest pain.  She ruled out from myocardia infarction.  Electrocardiogram was unremarkable for ischemia.  She has had no further chest pain since being placed back on her medications.  She is been on dual anti-platelet therapy with aspirin and Plavix.  She also was scheduled to be on atorvastatin for hyperlipidemia and metoprolol.  She also was on benazepril -hydrochlorothiazide.  She is currently hemodynamically stable and without chest pain or shortness of breath.  She has a history of a drug-eluting stent in her circumflex.   Physical Exam:  GEN no acute distress   HEENT PERRL   NECK supple   RESP normal resp effort  clear BS   CARD Regular rate and rhythm   ABD denies tenderness  no hernia   LYMPH negative neck   EXTR negative cyanosis/clubbing, negative edema   SKIN normal to palpation   NEURO cranial nerves intact, motor/sensory function intact   PSYCH A+O to time, place, person   Review of Systems:  Subjective/Chief Complaint Chest pain now resolved   General: No Complaints   Skin: No Complaints   ENT: No Complaints   Eyes: No Complaints   Neck: No Complaints   Respiratory: No Complaints   Cardiovascular: No Complaints   Gastrointestinal: No Complaints   Genitourinary: No Complaints   Vascular: No Complaints   Musculoskeletal: No Complaints   Neurologic: No Complaints   Hematologic: No Complaints   Endocrine: No Complaints   Psychiatric: No Complaints   Review of Systems: All other systems were reviewed and found to be negative   Medications/Allergies Reviewed Medications/Allergies reviewed   Family & Social History:  Family and Social History:  Family History Non-Contributory    Social History positive  tobacco   + Tobacco Current (within 1 year)   EKG:  EKG NSR   Interpretation no evidence of ischemia    No Known Allergies:    Impression 34 year old female with history of coronary and carotid disease.  She is status post PCI with a drug-eluting stent in her circumflex.  She has ruled out for myocardial infarction.  Her pain was likely secondary to not being able to take her medications due to financial problems.  She has been doing well since being placed back on her medicines.  She continues to smoke cigarettes.  Given her lack of symptoms, ruling out for myocardial infarction in normal EKG, would resume medications ambulate and discharge.  Outpatient workup could be considered in the future.   Plan 1. Continue with amlodipine at 5 mg daily, lisinopril 20 mg daily, metoprolol at 50 mg twice daily with use of tartrate Continue with aspirin and clopidogrel at  325 mg daily as well as clopidogrel 75 mg daily. 3. Ambulating consider discharge if stable 4. [facility] 5. Patient does not require cardiac rehabilitation secondary to ruling out for myocardial infarction and stability.   Electronic Signatures: Dalia HeadingFath, Enrika Aguado A (MD)  (Signed 14-Aug-15 10:54)  Authored: General Aspect/Present Illness, History and Physical Exam, Review of System, Family & Social History, EKG , Allergies, Impression/Plan   Last Updated: 14-Aug-15 10:54 by Dalia HeadingFath, Netanya Yazdani A (MD)

## 2014-09-18 NOTE — H&P (Signed)
PATIENT NAME:  [name], Cotten MR#:  [id number] DATE OF BIRTH:  November 20, 1980  DATE OF ADMISSION:  06/21/2013  PRIMARY CARE PHYSICIAN'S ASSISTANT:  Clance Boll.   CARDIOLOGIST:  Dr. Juliann Pares.   REFERRING PHYSICIAN: Dr. York Cerise.   CHIEF COMPLAINT: Chest pain, lightheadedness and three black-outs.   HISTORY OF PRESENT ILLNESS: The patient is a 34 year old African-American female who was recently hospitalized in November 2014 for non-STEMI, malignant hypertension, had a drug-eluting stent in mid RCA. The patient was started on Plavix and was discharged at that time. Eventually, the patient came into the ER again in January 2015 with right-sided chest pain and headache and diagnosed with malignant hypertension. At that time, troponin was elevated which was assumed to be demand ischemia from malignant hypertension and patient was discharged home. Again, last night the patient felt hot and dizzy,  then she blacked out for a few minutes. She did not get any injuries or trauma, as her friend caught her, even before she fell on the floor. Friend called EMS and by the time EMS came in, the patient started having chest pain. Chest pain was located in the middle of the chest, squeezing type, and feels tight in her chest, with no radiation, nausea or vomiting. She felt short of breath and has had a clammy feeling at that time. Also, the patient has reported that she had another two episodes of black-outs. She could not recall how much time she has passed out during each episode. No similar complaints in the past. During my examination, the patient is chest pain-free. The patient's mom is at bedside. The patient's troponin is at 0.05. Magnesium is 1.7 and potassium is 3.0. The patient has received 1 gram of magnesium sulfate IV and 40 mg of potassium in the ER. The hospitalist team is called to admit the patient. During my examination, the patient is denying any chest pain and resting comfortably, feeling tired.   PAST  MEDICAL HISTORY:  1.  Coronary artery disease status post-acute MI in November 2014, status post drug-eluting stent to mid RCA.  2.  History of hypertension. 3.  Hypertriglyceridemia.  4.  Borderline diabetes. 5.  Malignant hypertension.   PAST SURGICAL HISTORY:  1.  Status post coronary stent placement. 2.  Surgical resection of lesions under her arms.    ALLERGIES: No known drug allergies.   PSYCHOSOCIAL HISTORY: Lives at home with children. Smokes half pack a day. Denies alcohol or illicit drug usage.   FAMILY HISTORY: Mother has a history of hypertension, hyperlipidemia, diabetes mellitus.  HOME MEDICATIONS:   1.  Metoprolol tartrate 50 mg p.o. q.12 hours. 2.  Hydrochlorothiazide 25 mg p.o. once daily. 3.  Plavix 75 mg p.o. once daily. 4.  Citalopram 10 mg p.o. once daily. 5.  Benazepril 40 mg p.o. once daily. 6.  Atorvastatin 20 mg p.o. once daily. 7.  Aspirin 81 mg p.o. once daily.    REVIEW OF SYSTEMS: CONSTITUTIONAL: Denies any fever, fatigue.  EYES: Denies blurry vision, double vision.  ENT: Denies epistaxis, discharge.  RESPIRATORY:  Denies cough, COPD.  CARDIOVASCULAR: Comes with chest pain. Denies palpitations.  Passed out 3 times.   GASTROINTESTINAL: Denies nausea, vomiting, diarrhea. No abdominal pain.  GENITOURINARY: No dysuria, hematuria.  GYNECOLOGIC AND BREASTS:  Denies breast mass or vaginal discharge.  ENDOCRINE: Denies polyuria or nocturia. Has borderline diabetes mellitus.  HEMATOLOGIC AND LYMPHATIC: No anemia, easy bruising, bleeding.  INTEGUMENTARY: No acne, rash, lesions.  MUSCULOSKELETAL: No joint pain in the neck and  back. Denies gout.  NEUROLOGIC: No neurological deficits noted. Marland Kitchen.   PHYSICAL EXAMINATION: VITAL SIGNS: Temperature 97.9, pulse 87, respirations 16, blood pressure 125/70, pulse oximetry 99%.  GENERAL APPEARANCE: Not in acute distress. Moderately built and nourished.  HEENT: Normocephalic, atraumatic. Pupils are equal, reacting light  and accommodation. No scleral icterus. No conjunctival injection. No sinus tenderness. Moist mucous membranes. Oral cavity is intact.  NECK: Supple. No JVD or thyromegaly. Range of motion is intact.  LUNGS: Clear to auscultation bilaterally. No accessory muscle use and no anterior chest wall tenderness on palpation.  CARDIAC: S1, S2 normal. Regular rate and rhythm. No murmurs.  GASTROINTESTINAL: Soft. Bowel sounds are positive in all four quadrants. Nontender, nondistended. No hepatosplenomegaly. No masses felt.  NEUROLOGIC: Awake, alert, oriented x 3. Cranial nerves II through XII are grossly intact. Motor and sensory are intact. Reflexes are 2+.  EXTREMITIES: No edema. No cyanosis. No clubbing, peripheral pulses are 2+.  MUSCULOSKELETAL: No joint effusion, tenderness, erythema.  PSYCHIATRIC:  Normal mood and affect.   LABORATORY AND IMAGING STUDIES: Glucose 158, BUN 19, creatinine 1.06, sodium 135, potassium 3.0, chloride 102, CO2 24, anion gap 9. GFR greater than 60, serum osmolality 276, calcium 9.1, magnesium 1.7, lipase is 146. LFTs are normal. Troponin 0.05. WBC 16.4, hemoglobin 14.8, hematocrit 43.8, platelets are 272. PT 12.6, INR 1.0, PTT 26.9. Portable chest x-ray; no active disease. CT angiogram of the chest: No pulmonary embolism. Minimal peripheral nodule opacity suggested bilaterally which may reflect mild infectious or inflammatory process. Mild bilateral dependent subsegmental atelectasis is noted. A 12-lead EKG, normal sinus rhythm at 94 beats per minute, normal PR and QRS interval. Left ventricular hypertrophy, nonspecific ST-T wave changes were noticed in leads V1 through V6 which are present during the previous EKG on January 28th. No significant changes when compared to EKG from January 28.   ASSESSMENT AND PLAN: A 34 year old African American female presenting to the ER with a chief complaint of chest pain and three episodes of syncope. Will be admitted with the following  assessment and plan:  1.  Syncope, probably from cardiac etiology. Other differential diagnoses;  orthostatic hypotension, vasovagal syncope, cardiac arrhythmias or neurogenic. We will admit the patient to telemetry. CT head is pending.  CT angiogram of the chest: No pulmonary embolism. We will obtain carotid Doppler's and 2-D echocardiogram. Check orthostatics and neuro checks with vital signs. The patient will be on aspirin and statin.   2.  Chest pain, rule out acute myocardial infarction.  Acute coronary syndrome protocol with oxygen, nitroglycerin, aspirin, beta blocker and statin. We will cycle cardiac biomarkers. Will obtain echocardiogram and cardiology [facility].  3.  Hypertension. Resume patient's blood pressure medications and up-titrate as needed basis.  4.  Hyperlipidemia. On statin.  5.  Will provide gastrointestinal and deep vein thrombosis prophylaxis.  6.  The patient is full code. Mother is medical power of attorney.   Diagnosis and plan of care was discussed in detail with the patient. She is aware of the plan.   Total time spent on admission:  45 minutes.   ____________________________ Ramonita LabAruna Aldo Sondgeroth, MD ag:NTS D: 06/21/2013 04:03:19 ET T: 06/21/2013 04:54:01 ET JOB#: [id number]  cc: Ramonita LabAruna Anusha Claus, MD, <Dictator> Dwayne D. Juliann Paresallwood, MD Courtney ParisElizabeth B. Cliffton AstersWhite, FNP Ramonita LabARUNA Tarisha Fader MD ELECTRONICALLY SIGNED 07/05/2013 2:06

## 2014-09-18 NOTE — Discharge Summary (Signed)
PATIENT NAME:  COUSINYenni, [name] MR#:  [id number] DATE OF BIRTH:  [birth year]  DATE OF ADMISSION:  06/15/2013 DATE OF DISCHARGE:  06/17/2013  CONSULTANTS: Dr. Darrold Junker from cardiology.   CHIEF COMPLAINT: Right-sided chest pain, headache, chest pressure.   PRIMARY CARE PHYSICIAN:  [facility]. Doristine [facility] at [facility].   DISCHARGE DIAGNOSES: 1.  Positive troponin, likely demand ischemia from accelerated malignant hypertension.  2.  Hyperlipidemia.  3.  History of coronary artery disease status post stenting to mid right coronary artery late last year.  4.  History of hypertriglyceridemia.  5.  Ongoing tobacco abuse.  6.  History of borderline diabetes.   DISCHARGE MEDICATIONS: Aspirin 81 mg daily, Plavix 75 mg daily, benazepril 40 mg daily, escitalopram 10 mg daily, atorvastatin 20 mg daily, hydrochlorothiazide 25 mg daily, metoprolol tartrate 50 mg 2 times a day.   DIET: Low sodium, low fat, low cholesterol.   ACTIVITY: As tolerated.   Please follow with Dr. Darrold Junker within 1 to 2 weeks. Please follow with PCP within 1 to 2 weeks.   DISPOSITION: Home.   SIGNIFICANT LABORATORY DATA AND IMAGING: Initial BNP was 430. BUN 10, creatinine 0.84, sodium 139, potassium 3.3. First troponin 1, trended down; last troponin was 0.78. CK-MBs were within normal limits x 3. TSH was 1. Hemoglobin A1c of 5.7. HDL 34, LDL of 92, triglycerides better than prior at 185. CBC on arrival within normal limits. INR was 0.9. Echocardiogram showing EF of 60% to 65%, borderline LVH. Cardiac cath done on the 21st of January, and per Dr. Darrold Junker, whom I discussed the case with, the patient with patent stent in the mid RCA. No stenosis at the LAD, circumflex. The patient had 30% stenosis in the proximal RCA.   HISTORY OF PRESENT ILLNESS AND HOSPITAL COURSE: For the full details of the H and P, please see the dictation on January 19 by Dr. Jacques Navy, but briefly, this is a 34 year old female with history of non-ST elevation  myocardial infarction requiring a drug-eluting stent to mid right coronary artery lesion in November, malignant hypertension, who had been discharged on aspirin and Plavix, presented with the above chief complaint. The patient stated that she did not fill her Plavix and aspirin, came in with the above chief complaint. She had a positive troponin and also significantly elevated blood pressure with systolics running from 180s to 208, and diastolic running to the 120s. With the positive troponin, she was admitted to the hospitalist service for possible non-ST-elevation myocardial infarction and malignant hypertension. Her blood pressure was controlled with IV beta blockers, as well as resumption and adjustments throughout the patient's medications.  She was started on heparin drip. Troponins were cycled, which were trending down, and the patient had negative CK-MB x 3. She was seen by cardiology, Dr. Darrold Junker, who ultimately did a cardiac catheter on the 21st. The cath, per Dr. Darrold Junker, whom I discussed the case with, showed no significant blockage at the site of the stent, and had no significant coronary artery disease otherwise. She was instructed and counseled on multiple occasions to not miss any aspirin and Plavix unless stated by a physician, and follow with PCP and Dr. Darrold Junker as an outpatient. Thus, per Dr. Darrold Junker, this is likely not a non-ST elevation MI, but rather demand ischemia in the setting of accelerated malignant hypertension. Her blood pressure medications have been adjusted, and she is chest pain free. She was counseled about her tobacco abuse for 3 minutes. Her triglycerides are improved at this time.  She will be discharged with outpatient followup.   TIME SPENT: 40 minutes.   ____________________________ Krystal EatonShayiq [place], MD sa:dmm D: 06/17/2013 16:35:30 ET T: 06/17/2013 22:07:58 ET JOB#: [id number]  cc: Krystal EatonShayiq Denim Kalmbach, MD, <Dictator> Courtney ParisElizabeth B. White, FNP Marcina MillardAlexander Paraschos,  MD Krystal EatonSHAYIQ Yves Fodor MD ELECTRONICALLY SIGNED 07/11/2013 10:49

## 2014-09-18 NOTE — H&P (Signed)
PATIENT NAME:  [name], [name] MR#:  [id number] DATE OF BIRTH:  1981-04-30  DATE OF ADMISSION:  06/15/2013  REFERRING PHYSICIAN:  Dr. Darnelle Catalan.   PRIMARY CARE PHYSICIAN:  Dr. Doristine Mango at [facility].   PRIMARY CARDIOLOGIST:  Dr. Juliann Pares.   CHIEF COMPLAINT:  Right-sided chest pain, headache on the right side, chest pressure in the morning, chest pain in the morning.   HISTORY OF PRESENT ILLNESS:  The patient is a 34 year old African American female with hospitalization here in November for non-ST-elevation MI, malignant hypertension, who had a drug-eluting stent to mid RCA lesion. She was started on Plavix and was discharged at that time. She stated that she ran out of her aspirin and Plavix after the month's supply that she got discharged with, and they did not get it refilled. She has been following with Dr. Juliann Pares as an outpatient, as well as at the PCP's office. Of note, she states that her blood pressure have been fine, and they actually wanted to decrease her combined HCTZ/ACE inhibitor medications by half. They increased her metoprolol of note. She had a bout of weakness and numbness in the right upper extremity, a headache, more retro-orbital on the right side, kind of going downwards in the neck yesterday at night. She did not seek help at that time. She had a bout of 10-minute chest tightness and pressure this morning without radiation, and that resolved as well. She came into the hospital later in the day after she had chest pressure. The patient also had some shooting pain today, which initiated in the mid chest area going to the right arm and neck. She came into the hospital where she was found to have significantly elevated blood pressure anywhere from 180s systolic to 208. Currently, her diastolic when I was in the room was 122 with a systolic of 183. She has no active chest pains, and Dr. Juliann Pares, her cardiologist, was consulted by Dr. Darnelle Catalan, who recommended heparin drip loading  with Plavix, aspirin, and a cardiology consult. Currently, she has a troponin of 1.   PAST MEDICAL HISTORY:  1.  CAD status post MI with drug-eluting stent to mid RCA.  2.  Malignant hypertension.  3.  Hypertriglyceridemia.  4.  Borderline diabetes.  5.  Hypertension.   PAST SURGICAL HISTORY:  1.  Status post stent.  2.  Surgical resection of lesions under her arms.   ALLERGIES:  Denies.   CURRENT MEDICATIONS:  She is supposed to be on aspirin and clopidogrel, which she is not taking at this time. She takes benazepril/hydrochlorothiazide 20/2.5 mg half a tab once a day, escitalopram 10 mg daily, metoprolol 50 mg 2 times a day.   REVIEW OF SYSTEMS:  CONSTITUTIONAL:  No fever, fatigue. Has occasional sweating at night. No weight changes.  EYES:  No blurry vision or double vision.  ENT:  No tinnitus or hearing loss.  RESPIRATORY:  No cough, wheezing, shortness of breath currently, but she has had shortness of breath with activity prior. No wheezing or painful respirations.  CARDIOVASCULAR:  Chest pain as above. No swelling in the legs. No arrhythmia or palpitations. Has a history of CAD and stenting in the past.  GASTROINTESTINAL:  No nausea, vomiting, diarrhea, abdominal pain, bloody stools, or dark stools.  GENITOURINARY:  Denies dysuria, hematuria.  HEMATOLOGIC AND LYMPHATIC:  The patient has had some bruising in the lower extremities, but no bleeding.  SKIN:  No rashes.  MUSCULOSKELETAL:  Denies arthritis or gout.  NEUROLOGIC:  Denies focal weakness or numbness now, but she had bouts of numbness and weakness in the right side in the last 24 hours.  PSYCHIATRIC:  Has depression.   PHYSICAL EXAMINATION:  VITAL SIGNS:  Temperature on arrival 98, pulse rate 87, respiratory rate 18, initial blood pressure 184/106, peak systolic blood pressure of 208, peak diastolic pressure of 129, O2 sat 99% on oxygen.  GENERAL:  The patient is a well-developed, obese, African American female sitting in  bed, no obvious distress.  HEENT:  Normocephalic, atraumatic. Pupils are equal and reactive. Injected sclerae. Moist mucous membranes.  NECK:  Supple. No thyroid tenderness. No cervical lymphadenopathy.  CARDIOVASCULAR:  S1, S2 irregular. No murmurs, rubs, or gallops.  LUNGS:  Clear to auscultation without wheezing, rhonchi, or rales.  ABDOMEN:  Soft, nontender, nondistended. Positive bowel sounds in all quadrants.  EXTREMITIES:  No pitting edema.  NEUROLOGIC:  Cranial nerves II through XII grossly intact. Strength 4+ out of 5 to 5 on the right upper extremity. The patient has very good handgrip and arm extension. No range of motion difficulty, but there was a very slight decrease in strength in the right upper extremity compared to the left. Sensation is intact to light touch.  PSYCHIATRIC:  Awake, alert, oriented x3. Pleasant, cooperative.   LABORATORIES AND IMAGING:  Troponin 1, CK-MB of 2.3. White count of 8.6, hemoglobin 14.9, platelets 276. INR of 0.9, BNP 430, BUN 10, creatinine 0.84, sodium 139, potassium 3.3. EKG: Normal sinus rhythm, nonspecific T-wave changes, no acute ST elevations or depressions. Chest x-ray: Heart size and mediastinum contour are within normal limits, and lungs are clear.   ASSESSMENT AND PLAN:  We have a 34 year old African American female with hypertension, who had an 80% mid right coronary artery lesion, status post drug-eluting stent, with normal ejection fraction on November 24th, borderline diabetes, hypertriglyceridemia, who has been off of Plavix and aspirin for some time, Plavix for a little less than a month and aspirin for a few weeks, who presents with right-sided weakness and numbness, which has resolved per patient, malignant hypertension and positive troponin with probable non-ST elevation myocardial infarction with malignant hypertension and organ damage. She has been having extremely elevated systolic and diastolic pressures. Neurologically, she appears to  be intact except for very slight barely noticeable diminished strength in the right upper extremity. Although I doubt she has had a stroke, I suspect she has had malignant accelerated hypertension with possible thrombosis of her right coronary artery stent, as troponin is significantly elevated.   I would go ahead and obtain a CT of the brain. I would bring down the blood pressure, and in the setting of non-ST elevation myocardial infarction, I would give metoprolol IV, resume the metoprolol p.o., resume benazepril and hydrochlorothiazide, add labetalol p.r.n. for blood pressure control if the CAT scan is negative. The case has been discussed with her cardiologist already, who had advised a heparin drip, Plavix, aspirin, and a beta blocker. I would cycle the troponins and CK-MB, check a lipid profile, resume a statin. She still does smoke, and she was counseled for greater than 3 minutes about smoking cessation. For deep vein thrombosis prophylaxis, she would be on a heparin drip. I would obtain an echocardiogram, start her on oxygen.   CRITICAL CARE TIME SPENT:  50 minutes.     ____________________________ Krystal Eaton, MD sa:ms D: 06/15/2013 18:56:26 ET T: 06/15/2013 21:10:30 ET JOB#: [id number]  cc: Krystal Eaton, MD, <Dictator> Courtney Paris. White, FNP Krystal Eaton MD ELECTRONICALLY  SIGNED 07/11/2013 10:48

## 2014-10-14 ENCOUNTER — Encounter: Payer: Self-pay | Admitting: Emergency Medicine

## 2014-10-14 ENCOUNTER — Inpatient Hospital Stay
Admission: EM | Admit: 2014-10-14 | Discharge: 2014-10-16 | DRG: 247 | Disposition: A | Discharge status: Home / Self Care | Payer: Self-pay | Attending: Internal Medicine | Admitting: Internal Medicine

## 2014-10-14 ENCOUNTER — Emergency Department: Payer: Self-pay

## 2014-10-14 DIAGNOSIS — I25119 Atherosclerotic heart disease of native coronary artery with unspecified angina pectoris: Secondary | ICD-10-CM | POA: Diagnosis present

## 2014-10-14 DIAGNOSIS — I214 Non-ST elevation (NSTEMI) myocardial infarction: Principal | ICD-10-CM | POA: Diagnosis present

## 2014-10-14 DIAGNOSIS — F17210 Nicotine dependence, cigarettes, uncomplicated: Secondary | ICD-10-CM | POA: Diagnosis present

## 2014-10-14 DIAGNOSIS — Z7982 Long term (current) use of aspirin: Secondary | ICD-10-CM

## 2014-10-14 DIAGNOSIS — Z833 Family history of diabetes mellitus: Secondary | ICD-10-CM

## 2014-10-14 DIAGNOSIS — Z955 Presence of coronary angioplasty implant and graft: Secondary | ICD-10-CM

## 2014-10-14 DIAGNOSIS — I209 Angina pectoris, unspecified: Secondary | ICD-10-CM

## 2014-10-14 DIAGNOSIS — Z8249 Family history of ischemic heart disease and other diseases of the circulatory system: Secondary | ICD-10-CM

## 2014-10-14 DIAGNOSIS — Z79899 Other long term (current) drug therapy: Secondary | ICD-10-CM

## 2014-10-14 DIAGNOSIS — I252 Old myocardial infarction: Secondary | ICD-10-CM

## 2014-10-14 DIAGNOSIS — I10 Essential (primary) hypertension: Secondary | ICD-10-CM | POA: Diagnosis present

## 2014-10-14 DIAGNOSIS — E785 Hyperlipidemia, unspecified: Secondary | ICD-10-CM | POA: Diagnosis present

## 2014-10-14 HISTORY — DX: Essential (primary) hypertension: I10

## 2014-10-14 HISTORY — DX: Acute myocardial infarction, unspecified: I21.9

## 2014-10-14 HISTORY — DX: Hyperlipidemia, unspecified: E78.5

## 2014-10-14 LAB — COMPREHENSIVE METABOLIC PANEL
ALK PHOS: 50 U/L (ref 38–126)
ALT: 9 U/L — ABNORMAL LOW (ref 14–54)
ANION GAP: 7 (ref 5–15)
AST: 20 U/L (ref 15–41)
Albumin: 3.7 g/dL (ref 3.5–5.0)
BUN: 18 mg/dL (ref 6–20)
CO2: 24 mmol/L (ref 22–32)
Calcium: 8.7 mg/dL — ABNORMAL LOW (ref 8.9–10.3)
Chloride: 109 mmol/L (ref 101–111)
Creatinine, Ser: 0.74 mg/dL (ref 0.44–1.00)
GLUCOSE: 121 mg/dL — AB (ref 65–99)
POTASSIUM: 3.8 mmol/L (ref 3.5–5.1)
Sodium: 140 mmol/L (ref 135–145)
Total Bilirubin: 0.6 mg/dL (ref 0.3–1.2)
Total Protein: 7.2 g/dL (ref 6.5–8.1)

## 2014-10-14 LAB — CBC WITH DIFFERENTIAL/PLATELET
Basophils Absolute: 0.1 10*3/uL (ref 0–0.1)
Basophils Relative: 1 %
EOS ABS: 0.3 10*3/uL (ref 0–0.7)
Eosinophils Relative: 2 %
HCT: 39.9 % (ref 35.0–47.0)
HEMOGLOBIN: 13.2 g/dL (ref 12.0–16.0)
LYMPHS ABS: 3.5 10*3/uL (ref 1.0–3.6)
LYMPHS PCT: 32 %
MCH: 29.5 pg (ref 26.0–34.0)
MCHC: 33.2 g/dL (ref 32.0–36.0)
MCV: 89.1 fL (ref 80.0–100.0)
MONOS PCT: 5 %
Monocytes Absolute: 0.5 10*3/uL (ref 0.2–0.9)
Neutro Abs: 6.4 10*3/uL (ref 1.4–6.5)
Neutrophils Relative %: 60 %
Platelets: 244 10*3/uL (ref 150–440)
RBC: 4.48 MIL/uL (ref 3.80–5.20)
RDW: 13.3 % (ref 11.5–14.5)
WBC: 10.8 10*3/uL (ref 3.6–11.0)

## 2014-10-14 LAB — CK TOTAL AND CKMB (NOT AT ARMC)
CK, MB: 84.6 ng/mL — AB (ref 0.5–5.0)
Relative Index: 17.6 — ABNORMAL HIGH (ref 0.0–2.5)
Total CK: 480 U/L — ABNORMAL HIGH (ref 38–234)

## 2014-10-14 LAB — TROPONIN I
TROPONIN I: 0.06 ng/mL — AB (ref <0.031)
Troponin I: <0.03 ng/mL (ref <0.031)
Troponin I: 0.95 ng/mL — ABNORMAL HIGH

## 2014-10-14 LAB — GLUCOSE, CAPILLARY
Glucose-Capillary: 71 mg/dL (ref 65–99)
Glucose-Capillary: 119 mg/dL — ABNORMAL HIGH (ref 65–99)

## 2014-10-14 MED ORDER — BENAZEPRIL HCL 20 MG PO TABS
20.0000 mg | ORAL_TABLET | Freq: Every day | ORAL | Status: DC
  Administered 2014-10-14 – 2014-10-16 (×3): 20 mg via ORAL
  Filled 2014-10-14 (×3): qty 1

## 2014-10-14 MED ORDER — ATORVASTATIN CALCIUM 20 MG PO TABS
40.0000 mg | ORAL_TABLET | Freq: Every day | ORAL | Status: DC
Start: 2014-10-14 — End: 2014-10-16
  Administered 2014-10-14 – 2014-10-16 (×3): 40 mg via ORAL
  Filled 2014-10-14 (×3): qty 2

## 2014-10-14 MED ORDER — ENOXAPARIN SODIUM 80 MG/0.8ML ~~LOC~~ SOLN
1.0000 mg/kg | Freq: Two times a day (BID) | SUBCUTANEOUS | Status: DC
  Administered 2014-10-14: 75 mg via SUBCUTANEOUS
  Administered 2014-10-15: 80 mg via SUBCUTANEOUS
  Filled 2014-10-14 (×3): qty 0.8

## 2014-10-14 MED ORDER — ALUM & MAG HYDROXIDE-SIMETH 200-200-20 MG/5ML PO SUSP: 30.0000 mL | Freq: Four times a day (QID) | ORAL | Status: DC | PRN

## 2014-10-14 MED ORDER — MORPHINE SULFATE 4 MG/ML IJ SOLN
INTRAMUSCULAR | Status: AC
  Filled 2014-10-14: qty 1

## 2014-10-14 MED ORDER — OXYCODONE-ACETAMINOPHEN 5-325 MG PO TABS
ORAL_TABLET | ORAL | Status: AC
  Filled 2014-10-14: qty 1

## 2014-10-14 MED ORDER — ASPIRIN EC 81 MG PO TBEC: 81.0000 mg | DELAYED_RELEASE_TABLET | Freq: Every day | ORAL | Status: DC

## 2014-10-14 MED ORDER — HYDROCHLOROTHIAZIDE 25 MG PO TABS
25.0000 mg | ORAL_TABLET | Freq: Every day | ORAL | Status: DC
  Administered 2014-10-14 – 2014-10-16 (×3): 25 mg via ORAL
  Filled 2014-10-14 (×3): qty 1

## 2014-10-14 MED ORDER — TRAZODONE HCL 50 MG PO TABS: 50.0000 mg | ORAL_TABLET | Freq: Every evening | ORAL | Status: DC | PRN

## 2014-10-14 MED ORDER — ONDANSETRON HCL 4 MG/2ML IJ SOLN
INTRAMUSCULAR | Status: AC
  Filled 2014-10-14: qty 2

## 2014-10-14 MED ORDER — MORPHINE SULFATE 2 MG/ML IJ SOLN
1.0000 mg | INTRAMUSCULAR | Status: DC | PRN
  Administered 2014-10-14: 1 mg via INTRAVENOUS
  Administered 2014-10-15: 2 mg via INTRAVENOUS
  Administered 2014-10-15 (×2): 1 mg via INTRAVENOUS
  Filled 2014-10-14 (×4): qty 1

## 2014-10-14 MED ORDER — SODIUM CHLORIDE 0.9 % IJ SOLN
3.0000 mL | Freq: Two times a day (BID) | INTRAMUSCULAR | Status: DC
  Administered 2014-10-14 – 2014-10-16 (×3): 3 mL via INTRAVENOUS

## 2014-10-14 MED ORDER — ONDANSETRON HCL 4 MG/2ML IJ SOLN
INTRAMUSCULAR | Status: AC
  Administered 2014-10-14: 4 mg via INTRAVENOUS
  Filled 2014-10-14: qty 2

## 2014-10-14 MED ORDER — ONDANSETRON HCL 4 MG PO TABS: 4.0000 mg | ORAL_TABLET | Freq: Four times a day (QID) | ORAL | Status: DC | PRN

## 2014-10-14 MED ORDER — ONDANSETRON HCL 4 MG/2ML IJ SOLN
4.0000 mg | Freq: Once | INTRAMUSCULAR | Status: AC
  Administered 2014-10-14: 4 mg via INTRAVENOUS

## 2014-10-14 MED ORDER — MORPHINE SULFATE 4 MG/ML IJ SOLN
4.0000 mg | Freq: Once | INTRAMUSCULAR | Status: AC
  Administered 2014-10-14: 4 mg via INTRAVENOUS

## 2014-10-14 MED ORDER — NICOTINE 21 MG/24HR TD PT24
21.0000 mg | MEDICATED_PATCH | Freq: Every day | TRANSDERMAL | Status: DC
  Administered 2014-10-14 – 2014-10-16 (×3): 21 mg via TRANSDERMAL
  Filled 2014-10-14 (×3): qty 1

## 2014-10-14 MED ORDER — CLOPIDOGREL BISULFATE 75 MG PO TABS
75.0000 mg | ORAL_TABLET | Freq: Every day | ORAL | Status: DC
  Administered 2014-10-14: 75 mg via ORAL
  Filled 2014-10-14: qty 1

## 2014-10-14 MED ORDER — ACETAMINOPHEN 650 MG RE SUPP: 650.0000 mg | Freq: Four times a day (QID) | RECTAL | Status: DC | PRN

## 2014-10-14 MED ORDER — NITROGLYCERIN 0.4 MG SL SUBL: 0.4000 mg | SUBLINGUAL_TABLET | SUBLINGUAL | Status: DC | PRN

## 2014-10-14 MED ORDER — METOPROLOL TARTRATE 25 MG PO TABS
12.5000 mg | ORAL_TABLET | Freq: Two times a day (BID) | ORAL | Status: DC
Start: 2014-10-14 — End: 2014-10-16
  Administered 2014-10-14: 25 mg via ORAL
  Administered 2014-10-15: 12.5 mg via ORAL
  Administered 2014-10-15: 25 mg via ORAL
  Administered 2014-10-16: 12.5 mg via ORAL
  Filled 2014-10-14 (×5): qty 1

## 2014-10-14 MED ORDER — ONDANSETRON HCL 4 MG/2ML IJ SOLN: 4.0000 mg | Freq: Four times a day (QID) | INTRAMUSCULAR | Status: DC | PRN

## 2014-10-14 MED ORDER — MORPHINE SULFATE 4 MG/ML IJ SOLN
INTRAMUSCULAR | Status: AC
  Administered 2014-10-14: 4 mg via INTRAVENOUS
  Filled 2014-10-14: qty 1

## 2014-10-14 MED ORDER — INSULIN ASPART 100 UNIT/ML ~~LOC~~ SOLN
0.0000 [IU] | Freq: Three times a day (TID) | SUBCUTANEOUS | Status: DC
  Administered 2014-10-15: 1 [IU] via SUBCUTANEOUS
  Filled 2014-10-14: qty 1

## 2014-10-14 MED ORDER — SENNOSIDES-DOCUSATE SODIUM 8.6-50 MG PO TABS: 1.0000 | ORAL_TABLET | Freq: Every evening | ORAL | Status: DC | PRN

## 2014-10-14 MED ORDER — AMLODIPINE BESYLATE 5 MG PO TABS
5.0000 mg | ORAL_TABLET | Freq: Every day | ORAL | Status: DC
  Administered 2014-10-14 – 2014-10-16 (×3): 5 mg via ORAL
  Filled 2014-10-14 (×3): qty 1

## 2014-10-14 MED ORDER — OXYCODONE-ACETAMINOPHEN 5-325 MG PO TABS
1.0000 | ORAL_TABLET | Freq: Once | ORAL | Status: AC
Start: 2014-10-14 — End: 2014-10-14
  Administered 2014-10-14: 1 via ORAL

## 2014-10-14 MED ORDER — ACETAMINOPHEN 325 MG PO TABS
650.0000 mg | ORAL_TABLET | Freq: Four times a day (QID) | ORAL | Status: DC | PRN
  Administered 2014-10-14: 650 mg via ORAL
  Filled 2014-10-14: qty 2

## 2014-10-14 NOTE — H&P (Signed)
[facility] [place] [facility]   PATIENT NAME: [name]    MR#:  [id number]  DATE OF BIRTH:  11/18/1980  DATE OF ADMISSION:  10/14/2014  PRIMARY CARE PHYSICIAN: WHITE, Arlyss RepressELIZABETH BURNEY, NP   REQUESTING/REFERRING PHYSICIAN: Dr. Lenard LancePaduchowski  CHIEF COMPLAINT:   Midsternal chest pain HISTORY OF PRESENT ILLNESS:  Monica Polioina Oehlert  is a 34 y.o. female with a known history of coronary artery disease status post stent in RCA, tobacco dependence, hypertension who presents with above complaint. Patient says she woke up this morning with midsternal chest pain radiating to her throat with a choking sensation and shortness of breath. This is very similar to her heart attack which she had in 2014. Her symptoms were initially 8 out of 10 but relieved after 2 nitroglycerin. She called EMS and they provided natural glycerin spray and 4 aspirins which relieved the pain. She currently is chest pain-free. In the emergency department her second troponin was 0.06. Hospital service was consulted for evaluation for hospital admission.  PAST MEDICAL HISTORY:   Past Medical History  Diagnosis Date  . Hypertension   . Myocardial infarction     2014  . Hyperlipidemia    Patient has CAD with a stent in RCA. Tobacco dependence PAST SURGICAL HISTORY:   Past Surgical History  Procedure Laterality Date  . Cardiac surgery  Cardiac stent to right coronary artery   Patient had breast lumpectomy Lymph node from axilla SOCIAL HISTORY:   History  Substance Use Topics  . Smoking status: Current Every Day Smoker -- 1.00 packs/day    Types: Cigarettes  . Smokeless tobacco: Never Used  . Alcohol Use: Yes   patient smokes one pack a day with occasional call use  FAMILY HISTORY:   Family History  Problem Relation Age of Onset  . Diabetes Mother   . Hypertension Mother   . Diabetes Father   . Hypertension Father   . Heart attack Sister     DRUG ALLERGIES:  No Known  Allergies   REVIEW OF SYSTEMS:  CONSTITUTIONAL: No fever, fatigue or weakness.  EYES: No blurred or double vision.  EARS, NOSE, AND THROAT: No tinnitus or ear pain.  RESPIRATORY: No cough, shortness of breath, wheezing or hemoptysis.  CARDIOVASCULAR: Positive midsternal chest pain with shortness of breath. No palpitations GASTROINTESTINAL: No nausea, vomiting, diarrhea or abdominal pain.  GENITOURINARY: No dysuria, hematuria.  ENDOCRINE: No polyuria, nocturia,  HEMATOLOGY: No anemia, easy bruising or bleeding SKIN: No rash or lesion. MUSCULOSKELETAL: No joint pain or arthritis.   NEUROLOGIC: No tingling, numbness, weakness.  PSYCHIATRY: No anxiety or depression.   MEDICATIONS AT HOME:   Prior to Admission medications   Medication Sig Start Date End Date Taking? Authorizing Provider  amLODipine (NORVASC) 5 MG tablet Take 1 tablet by mouth daily. 12/01/13  Yes Historical Provider, MD  aspirin EC 81 MG tablet Take 1 tablet by mouth daily.   Yes Historical Provider, MD  atorvastatin (LIPITOR) 40 MG tablet Take 40 mg by mouth daily.   Yes Historical Provider, MD  benazepril (LOTENSIN) 20 MG tablet Take 20 mg by mouth daily.   Yes Historical Provider, MD  clopidogrel (PLAVIX) 75 MG tablet Take 1 tablet by mouth daily.   Yes Historical Provider, MD  hydrochlorothiazide (HYDRODIURIL) 25 MG tablet Take 25 mg by mouth daily.   Yes Historical Provider, MD  metoprolol tartrate (LOPRESSOR) 25 MG tablet Take 12.5 mg by mouth 2 (two) times daily. 12/01/13 12/01/14 Yes Historical Provider,  MD  nitroGLYCERIN (NITROSTAT) 0.4 MG SL tablet Place 0.4 mg under the tongue every 5 (five) minutes as needed. 10/31/13 10/31/14 Yes Historical Provider, MD  traZODone (DESYREL) 50 MG tablet Take 1 tablet by mouth at bedtime as needed. 08/22/12  Yes Historical Provider, MD      VITAL SIGNS:  Blood pressure 165/107, pulse 82, temperature 98 F (36.7 C), temperature source Oral, resp. rate 16, height 5' (1.524 m), weight  76.204 kg (168 lb), last menstrual period 10/07/2014, SpO2 96 %.  PHYSICAL EXAMINATION:  GENERAL:  34 y.o.-year-old patient lying in the bed with no acute distress.  EYES: Pupils equal, round, reactive to light and accommodation. No scleral icterus. Extraocular muscles intact.  HEENT: Head atraumatic, normocephalic. Oropharynx and nasopharynx clear.  NECK:  Supple, no jugular venous distention. No thyroid enlargement, no tenderness.  LUNGS: Normal breath sounds bilaterally, no wheezing, rales,rhonchi or crepitation. No use of accessory muscles of respiration.  CARDIOVASCULAR: S1, S2 normal. No murmurs, rubs, or gallops.  ABDOMEN: Soft, nontender, nondistended. Bowel sounds present. No organomegaly or mass.  EXTREMITIES: No pedal edema, cyanosis, or clubbing.  NEUROLOGIC: Cranial nerves II through XII are intact. Muscle strength 5/5 in all extremities. Sensation intact.   PSYCHIATRIC: The patient is alert and oriented x 3.  SKIN: No obvious rash, lesion, or ulcer.   LABORATORY PANEL:   CBC  Recent Labs Lab 10/14/14 0826  WBC 10.8  HGB 13.2  HCT 39.9  PLT 244   ------------------------------------------------------------------------------------------------------------------  Chemistries   Recent Labs Lab 10/14/14 0826  NA 140  K 3.8  CL 109  CO2 24  GLUCOSE 121*  BUN 18  CREATININE 0.74  CALCIUM 8.7*  AST 20  ALT 9*  ALKPHOS 50  BILITOT 0.6   ------------------------------------------------------------------------------------------------------------------  Cardiac Enzymes  Recent Labs Lab 10/14/14 0826 10/14/14 1116  TROPONINI <0.03 0.06*   ------------------------------------------------------------------------------------------------------------------  RADIOLOGY:  Dg Chest Portable 1 View  10/14/2014    IMPRESSION: No active disease.   Electronically Signed   By: Natasha Mead M.D.   On: 10/14/2014 09:06    EKG:   Orders placed or performed during the  hospital encounter of 10/14/14  . ED EKG  . ED EKG   Normal sinus rhythm she is Q waves in the anterior leads IMPRESSION AND PLAN:  Visit very pleasant 34 year old female with a history of coronary artery disease status post stent in the RCA, tobacco dependence, hypertension and hyperlipidemia who presents with midsternal chest pain radiating to her throat which was of breath.  1 non-ST elevation MI: Patient has troponin of 0.06 and with symptoms similar to her heart attack in 2014. Patient will be placed on telemetry unit. Patient is on full dose Lovenox. We will  continue her outpatient medications including Plavix, aspirin, metoprolol and statin. Cardiology has been consulted for further evaluation and management. I will continue to cycle her troponin levels.   2. Accelerated hypertension: Patient did not take her medications this morning. We will continue metoprolol, Benzapril and HCTZ. I've also written for when necessary hydralazine   3. Tobacco dependence: Patient is encouraged to stop smoking. Patient does not want a nicotine patch. Patient was counseled for 3 minutes.  4. Hyperlipidemia: I will check fasting lipids. We'll continue with atorvastatin.   All the records are reviewed and case discussed with ED provider. Management plans discussed with the patient and she is in agreement.  CODE STATUS: Full  TOTAL TIME TAKING CARE OF THIS PATIENT: 45 minutes.  Jarad Barth M.D on 10/14/2014 at 3:15 PM  Between 7am to 6pm - Pager - [phone] After 6pm go to [email or web address] - password [email or web address] [facility]  [facility]  Office  [phone]  CC: Primary care physician; WHITE, Arlyss RepressELIZABETH BURNEY, NP

## 2014-10-14 NOTE — Progress Notes (Signed)
Called dr on call which was Dr Vicenta AlyKonnadina,and made her aware of the troponin of 0.95.  She said OK,

## 2014-10-14 NOTE — Consult Note (Signed)
CARDIOLOGY CONSULT NOTE  Patient ID: [name] MRN: [id number] DOB/AGE: 36/10/1980 33 y.o.  Admit date: 10/14/2014 Referring Physician Juliene PinaMody  Primary Physician [facility] Primary Cardiologist Gwen PoundsKowalski Reason for Consultation Chest pain and abnormal troponin  HPI: Pt with history of cad s/p pci of RCA in 2014 with DES who was admitted after several hours of midsternal and neck pain. She states the symptoms had features similar to her angina. Her ekg was unremarkable but she still has chest pain. Initial troponin was 0.03, with second value of 0.06. She has been compliant with meds including dual antiplatelet therapy. She continues to smoke  Review of systems complete and found to be negative unless listed above   Past Medical History  Diagnosis Date  . Hypertension   . Myocardial infarction     2014  . Hyperlipidemia     Family History  Problem Relation Age of Onset  . Diabetes Mother   . Hypertension Mother   . Diabetes Father   . Hypertension Father   . Heart attack Sister     History   Social History  . Marital Status: Married    Spouse Name: N/A  . Number of Children: N/A  . Years of Education: N/A   Occupational History  . Not on file.   Social History Main Topics  . Smoking status: Current Every Day Smoker -- 1.00 packs/day    Types: Cigarettes  . Smokeless tobacco: Never Used  . Alcohol Use: Yes  . Drug Use: No  . Sexual Activity: Not on file   Other Topics Concern  . Not on file   Social History Narrative  . No narrative on file    Past Surgical History  Procedure Laterality Date  . Cardiac surgery  Cardiac stent to right coronary artery     Prescriptions prior to admission  Medication Sig Dispense Refill Last Dose  . amLODipine (NORVASC) 5 MG tablet Take 1 tablet by mouth daily.   10/13/2014 at Unknown time  . aspirin EC 81 MG tablet Take 1 tablet by mouth daily.   10/13/2014 at Unknown time  . atorvastatin (LIPITOR) 40 MG tablet Take 40 mg by  mouth daily.   10/13/2014 at Unknown time  . benazepril (LOTENSIN) 20 MG tablet Take 20 mg by mouth daily.   10/13/2014 at Unknown time  . clopidogrel (PLAVIX) 75 MG tablet Take 1 tablet by mouth daily.   10/13/2014 at Unknown time  . hydrochlorothiazide (HYDRODIURIL) 25 MG tablet Take 25 mg by mouth daily.   10/13/2014 at Unknown time  . metoprolol tartrate (LOPRESSOR) 25 MG tablet Take 12.5 mg by mouth 2 (two) times daily.   10/13/2014 at Unknown time  . nitroGLYCERIN (NITROSTAT) 0.4 MG SL tablet Place 0.4 mg under the tongue every 5 (five) minutes as needed.   10/14/2014 at Unknown time  . traZODone (DESYREL) 50 MG tablet Take 1 tablet by mouth at bedtime as needed.   unknown    Physical Exam: Blood pressure 194/102, pulse 61, temperature 98.2 F (36.8 C), temperature source Oral, resp. rate 18, height 5' (1.524 m), weight 76.204 kg (168 lb), last menstrual period 10/07/2014, SpO2 96 %. General appearance: alert, cooperative and appears stated age Head: Normocephalic, without obvious abnormality, atraumatic Neck: no adenopathy, no carotid bruit, no JVD, supple, symmetrical, trachea midline and thyroid not enlarged, symmetric, no tenderness/mass/nodules Resp: clear to auscultation bilaterally Cardio: regular rate and rhythm, S1, S2 normal, no murmur, click, rub or gallop GI: soft, non-tender; bowel  sounds normal; no masses,  no organomegaly Extremities: extremities normal, atraumatic, no cyanosis or edema Neurologic: Grossly normal Labs:   Lab Results  Component Value Date   WBC 10.8 10/14/2014   HGB 13.2 10/14/2014   HCT 39.9 10/14/2014   MCV 89.1 10/14/2014   PLT 244 10/14/2014    Recent Labs Lab 10/14/14 0826  NA 140  K 3.8  CL 109  CO2 24  BUN 18  CREATININE 0.74  CALCIUM 8.7*  PROT 7.2  BILITOT 0.6  ALKPHOS 50  ALT 9*  AST 20  GLUCOSE 121*   Lab Results  Component Value Date   TROPONINI 0.06* 10/14/2014      Radiology: CXR unremarkable EKG: NSR with no  ischemia  ASSESSMENT AND PLAN:  Pt with history of cad s/p pci of rca with des in 11/14 who is admtited with chest pian similar to her angina. She has mild troponin elevation. WIll cotinuue with asa, plavix, atorvastatin, metoprolol. Will review third troponin. Likely will proceed with left cardiac cath in am to evaluate anatomy in pateint with continued rest chest pain, history of pci and elevated troponin.    PLAN  Continue with metoprolol at 12.5 mg bid; atorvastatin 40 mg daily; asa and plavix; benazepril 20 mg daily and hctz 25 mg daily.   Follow troponin and if elevated further, will proceed with left cardaic cath in am.  Signed: Dalia HeadingFATH,Teryn Boerema A. MD, [facility] 10/14/2014, 5:21 PM

## 2014-10-14 NOTE — Progress Notes (Signed)
Pt alert and oriented x4,  complaints of pain andr discomfort.  Bed in low position, call bell within reach.  Bed alarms on and functioning.  Assessment done and charted.  Will continue to monitor and do hourly rounding throughout the shift. Pt was medicated with MSO4 for pain 9/10.  Will reassess for relief of severe pain

## 2014-10-14 NOTE — ED Notes (Signed)
Patient to ER for c/o chest pain that patient states woke her. Also c/o choking/tightness feeling to neck and throat. Upon EMS arrival to home, patient was tachypneic with respirations at 46. Patient took 1 SL Nitro at home with no relief. Complained of numbness to right arm, able to feel arm at this time. atient has not had any other medications today at home. Was given ASA 324 and Nitro SL x1 via EMS with no relief of pain. Patient has h/o MI in 2014 with right sided stent placed. Patient states current pain is similar to previous MI.

## 2014-10-14 NOTE — ED Notes (Signed)
Patient now c/o pain to mid back as well. Tearful throughout triage and assessment.

## 2014-10-14 NOTE — ED Provider Notes (Signed)
[facility] Emergency Department Provider Note  Time seen: 8:29 AM  I have reviewed the triage vital signs and the nursing notes.   HISTORY  Chief Complaint Chest Pain    HPI [name] is a 34 y.o. female with a past medical history of hypertension, hyperlipidemia, myocardial infarction (2014 with a stent placed by Dr. Juliann Paresallwood), presents to the emergency department with chest tightness and throat tightness. Patient states she awoke approximately one hour ago with a feeling of throat and chest tightness which she describes as an 8/10 in severity, associated with shortness of breath and diaphoresis. Denies any nausea or vomiting. Patient states similar throat tightness with her MI in 2014. The patient received nitroglycerin and aspirin by EMS with no relief.    Past Medical History  Diagnosis Date  . Hypertension   . Myocardial infarction     2014  . Hyperlipidemia     There are no active problems to display for this patient.   Past Surgical History  Procedure Laterality Date  . Cardiac surgery  Cardiac stent to right coronary artery    No current outpatient prescriptions on file.  Allergies Review of patient's allergies indicates no known allergies.  Family History  Problem Relation Age of Onset  . Diabetes Mother   . Hypertension Mother   . Diabetes Father   . Hypertension Father   . Heart attack Sister     Social History History  Substance Use Topics  . Smoking status: Current Every Day Smoker -- 1.00 packs/day    Types: Cigarettes  . Smokeless tobacco: Never Used  . Alcohol Use: Yes    Review of Systems Constitutional: Negative for fever. Cardiovascular: Positive for chest tightness Respiratory: Negative for shortness of breath currently, but states felt mildly short of breath this morning. Gastrointestinal: Negative for abdominal pain, vomiting and diarrhea. Musculoskeletal: Negative for back pain. Negative for leg pain or  swelling. 10-point ROS otherwise negative.  ____________________________________________   PHYSICAL EXAM:  VITAL SIGNS: ED Triage Vitals  Enc Vitals Group     BP 10/14/14 0816 171/109 mmHg     Pulse Rate 10/14/14 0816 85     Resp 10/14/14 0816 20     Temp 10/14/14 0816 98 F (36.7 C)     Temp Source 10/14/14 0816 Oral     SpO2 10/14/14 0816 100 %     Weight 10/14/14 0816 168 lb (76.204 kg)     Height 10/14/14 0816 5' (1.524 m)     Head Cir --      Peak Flow --      Pain Score 10/14/14 0817 8     Pain Loc --      Pain Edu? --      Excl. in GC? --     Constitutional: Alert and oriented. Well appearing and in no distress. Eyes: Normal exam ENT   Mouth/Throat: Mucous membranes are moist. Cardiovascular: Normal rate, regular rhythm. No murmurs, rubs, or gallops. Respiratory: Normal respiratory effort without tachypnea nor retractions. Breath sounds are clear and equal bilaterally. No wheezes/rales/rhonchi. Gastrointestinal: Soft and nontender. No distention.   Musculoskeletal: Nontender with normal range of motion in all extremities. No lower extremity tenderness or edema. Neurologic:  Normal speech and language. No gross focal neurologic deficits Skin:  Skin is warm, dry and intact. No diaphoresis. Psychiatric: Mood and affect are normal. Speech and behavior are normal.   ____________________________________________    EKG  EKG shows normal sinus rhythm at 85  bpm, narrow QRS, normal axis, normal intervals. Normal ST segments, overall normal EKG.  ____________________________________________    RADIOLOGY  Chest x-ray shows no active disease.  ____________________________________________  INITIAL IMPRESSION / ASSESSMENT AND PLAN / ED COURSE  Pertinent labs & imaging results that were available during my care of the patient were reviewed by me and considered in my medical decision making (see chart for details).  34 year old female with a past medical history  of MI who presents the emergency department with chest tightness and throat tightness unrelieved by nitroglycerin/aspirin. We'll check labs, treat pain. Patient's EKG appears largely normal.  ----------------------------------------- 2:17 PM on 10/14/2014 -----------------------------------------  Second troponin slightly elevated still 2/10 pain. Given her cardiac history with similar presentation we'll admit for further workup and evaluation. ____________________________________________   FINAL CLINICAL IMPRESSION(S) / ED DIAGNOSES  Chest pain NSTEMI  Minna AntisKevin Kailei Cowens, MD 10/14/14 [phone]

## 2014-10-14 NOTE — ED Notes (Signed)
MD at bedside. 

## 2014-10-14 NOTE — Progress Notes (Signed)
[facility] [place]

## 2014-10-14 NOTE — Progress Notes (Signed)
ANTICOAGULATION CONSULT NOTE - Initial Consult  Pharmacy Consult for Enoxaparin 1mg /kg  Indication: chest pain/ACS  No Known Allergies  Patient Measurements: Height: 5' (152.4 cm) Weight: 168 lb (76.204 kg) IBW/kg (Calculated) : 45.5  Vital Signs: Temp: 98.2 F (36.8 C) (05/19 1556) Temp Source: Oral (05/19 1556) BP: 192/100 mmHg (05/19 1733) Pulse Rate: 78 (05/19 1733)  Labs:  Recent Labs  10/14/14 0826 10/14/14 1116  HGB 13.2  --   HCT 39.9  --   PLT 244  --   CREATININE 0.74  --   TROPONINI <0.03 0.06*    Estimated Creatinine Clearance: 89.6 mL/min (by C-G formula based on Cr of 0.74).   Assessment: Pharmacy consulted to dose treatment dose enoxaparin for chest pain/ACS   Plan:  Ordered enoxaparin 75mg  (1mg /kg) Q12H.   Pharmacy to follow per consult  Garlon HatchetJody Brandin Dilday, PharmD Clinical Pharmacist 10/14/2014,5:40 PM

## 2014-10-15 ENCOUNTER — Encounter
Admission: EM | Disposition: A | Discharge status: Home / Self Care | Payer: Self-pay | Source: Home / Self Care | Attending: Internal Medicine

## 2014-10-15 ENCOUNTER — Encounter: Payer: Self-pay | Admitting: *Deleted

## 2014-10-15 HISTORY — PX: CARDIAC CATHETERIZATION: SHX172

## 2014-10-15 LAB — LIPID PANEL
CHOL/HDL RATIO: 5.6 ratio
CHOLESTEROL: 214 mg/dL — AB (ref 0–200)
HDL: 38 mg/dL — ABNORMAL LOW (ref >40)
Triglycerides: 686 mg/dL — ABNORMAL HIGH (ref <150)

## 2014-10-15 LAB — BASIC METABOLIC PANEL
Anion gap: 7 (ref 5–15)
BUN: 18 mg/dL (ref 6–20)
CO2: 28 mmol/L (ref 22–32)
Calcium: 9.4 mg/dL (ref 8.9–10.3)
Chloride: 101 mmol/L (ref 101–111)
Creatinine, Ser: 0.75 mg/dL (ref 0.44–1.00)
GFR calc non Af Amer: >60 mL/min (ref >60)
Glucose, Bld: 109 mg/dL — ABNORMAL HIGH (ref 65–99)
Potassium: 3.9 mmol/L (ref 3.5–5.1)
Sodium: 136 mmol/L (ref 135–145)

## 2014-10-15 LAB — GLUCOSE, CAPILLARY
GLUCOSE-CAPILLARY: 99 mg/dL (ref 65–99)
Glucose-Capillary: 129 mg/dL — ABNORMAL HIGH (ref 65–99)
Glucose-Capillary: 136 mg/dL — ABNORMAL HIGH (ref 65–99)
Glucose-Capillary: 111 mg/dL — ABNORMAL HIGH (ref 65–99)

## 2014-10-15 SURGERY — LEFT HEART CATH
Anesthesia: Moderate Sedation

## 2014-10-15 MED ORDER — SODIUM CHLORIDE 0.9 % IV SOLN
250.0000 mg | INTRAVENOUS | Status: DC | PRN
  Administered 2014-10-15: 1.75 mg/kg/h via INTRAVENOUS

## 2014-10-15 MED ORDER — SODIUM CHLORIDE 0.9 % WEIGHT BASED INFUSION: 1.0000 mL/kg/h | INTRAVENOUS | Status: DC

## 2014-10-15 MED ORDER — CLOPIDOGREL BISULFATE 75 MG PO TABS
75.0000 mg | ORAL_TABLET | Freq: Every day | ORAL | Status: DC
  Administered 2014-10-16: 75 mg via ORAL
  Filled 2014-10-15: qty 1

## 2014-10-15 MED ORDER — BIVALIRUDIN BOLUS VIA INFUSION - CUPID
INTRAVENOUS | Status: DC | PRN
  Administered 2014-10-15: 58.2 mg via INTRAVENOUS

## 2014-10-15 MED ORDER — IOHEXOL 300 MG/ML  SOLN
INTRAMUSCULAR | Status: DC | PRN
  Administered 2014-10-15: 60 mL via INTRAVENOUS
  Administered 2014-10-15: 30 mL via INTRAVENOUS

## 2014-10-15 MED ORDER — BIVALIRUDIN 250 MG IV SOLR
INTRAVENOUS | Status: AC
  Filled 2014-10-15: qty 250

## 2014-10-15 MED ORDER — IOPAMIDOL (ISOVUE-300) INJECTION 61%
INTRAVENOUS | Status: DC | PRN
  Administered 2014-10-15: 100 mL via INTRAVENOUS

## 2014-10-15 MED ORDER — SODIUM CHLORIDE 0.9 % WEIGHT BASED INFUSION: 3.0000 mL/kg/h | INTRAVENOUS | Status: DC

## 2014-10-15 MED ORDER — ASPIRIN 81 MG PO CHEW
81.0000 mg | CHEWABLE_TABLET | Freq: Every day | ORAL | Status: DC
  Administered 2014-10-15 – 2014-10-16 (×2): 81 mg via ORAL
  Filled 2014-10-15 (×2): qty 1

## 2014-10-15 MED ORDER — ASPIRIN 325 MG PO TABS
ORAL_TABLET | ORAL | Status: DC | PRN
Start: 2014-10-15 — End: 2014-10-15

## 2014-10-15 MED ORDER — HEPARIN (PORCINE) IN NACL 2-0.9 UNIT/ML-% IJ SOLN
INTRAMUSCULAR | Status: AC
  Filled 2014-10-15: qty 500

## 2014-10-15 MED ORDER — FENTANYL CITRATE (PF) 100 MCG/2ML IJ SOLN
INTRAMUSCULAR | Status: AC
  Filled 2014-10-15: qty 2

## 2014-10-15 MED ORDER — CLOPIDOGREL BISULFATE 75 MG PO TABS
ORAL_TABLET | ORAL | Status: AC
  Filled 2014-10-15: qty 8

## 2014-10-15 MED ORDER — NITROGLYCERIN 5 MG/ML IV SOLN
INTRAVENOUS | Status: AC
  Filled 2014-10-15: qty 10

## 2014-10-15 MED ORDER — FENTANYL CITRATE (PF) 100 MCG/2ML IJ SOLN
INTRAMUSCULAR | Status: DC | PRN
  Administered 2014-10-15: 50 ug via INTRAVENOUS

## 2014-10-15 MED ORDER — NITROGLYCERIN 0.2 MG/ML ON CALL CATH LAB
INTRAVENOUS | Status: DC | PRN
Start: 2014-10-15 — End: 2014-10-15
  Administered 2014-10-15 (×2): 200 ug via INTRA_ARTERIAL

## 2014-10-15 MED ORDER — SODIUM CHLORIDE 0.9 % IV SOLN: 250.0000 mL | INTRAVENOUS | Status: DC | PRN

## 2014-10-15 MED ORDER — ASPIRIN 81 MG PO CHEW
CHEWABLE_TABLET | ORAL | Status: AC
  Filled 2014-10-15: qty 4

## 2014-10-15 MED ORDER — MIDAZOLAM HCL 2 MG/2ML IJ SOLN
INTRAMUSCULAR | Status: DC | PRN
  Administered 2014-10-15 (×3): 1 mg via INTRAVENOUS

## 2014-10-15 MED ORDER — SODIUM CHLORIDE 0.9 % IV SOLN
INTRAVENOUS | Status: AC
  Administered 2014-10-15: 18:00:00 via INTRAVENOUS

## 2014-10-15 MED ORDER — MIDAZOLAM HCL 2 MG/2ML IJ SOLN
INTRAMUSCULAR | Status: AC
  Filled 2014-10-15: qty 2

## 2014-10-15 MED ORDER — SODIUM CHLORIDE 0.9 % IJ SOLN
3.0000 mL | Freq: Two times a day (BID) | INTRAMUSCULAR | Status: DC
  Administered 2014-10-15 (×2): 3 mL via INTRAVENOUS

## 2014-10-15 MED ORDER — ASPIRIN 81 MG PO CHEW
CHEWABLE_TABLET | ORAL | Status: DC | PRN
  Administered 2014-10-15: 324 mg via ORAL

## 2014-10-15 MED ORDER — CLOPIDOGREL BISULFATE 300 MG PO TABS
ORAL_TABLET | ORAL | Status: DC | PRN
Start: 2014-10-15 — End: 2014-10-15
  Administered 2014-10-15: 600 mg via ORAL

## 2014-10-15 MED ORDER — SODIUM CHLORIDE 0.9 % IJ SOLN: 3.0000 mL | Freq: Two times a day (BID) | INTRAMUSCULAR | Status: DC

## 2014-10-15 MED ORDER — SODIUM CHLORIDE 0.9 % IJ SOLN
3.0000 mL | INTRAMUSCULAR | Status: DC | PRN
  Administered 2014-10-15: 3 mL via INTRAVENOUS
  Filled 2014-10-15: qty 10

## 2014-10-15 MED ORDER — SODIUM CHLORIDE 0.9 % IJ SOLN: 3.0000 mL | INTRAMUSCULAR | Status: DC | PRN

## 2014-10-15 SURGICAL SUPPLY — 17 items
BALLN TREK RX 2.5X15 (BALLOONS) ×4
BALLN TREK RX 2.75X15 (BALLOONS) ×4
BALLOON TREK RX 2.5X15 (BALLOONS) ×2 IMPLANT
BALLOON TREK RX 2.75X15 (BALLOONS) ×2 IMPLANT
CATH INFINITI 5FR ANG PIGTAIL (CATHETERS) ×4 IMPLANT
CATH INFINITI 5FR JL4 (CATHETERS) ×4 IMPLANT
CATH INFINITI JR4 5F (CATHETERS) ×4 IMPLANT
CATH VISTA GUIDE 6FR JR4 SH (CATHETERS) ×4 IMPLANT
DEVICE CLOSURE MYNXGRIP 6/7F (Vascular Products) ×4 IMPLANT
DEVICE INFLAT 30 PLUS (MISCELLANEOUS) ×4 IMPLANT
KIT MANI 3VAL PERCEP (MISCELLANEOUS) ×4 IMPLANT
NEEDLE PERC 18GX7CM (NEEDLE) ×4 IMPLANT
PACK CARDIAC CATH (CUSTOM PROCEDURE TRAY) ×4 IMPLANT
SHEATH AVANTI 5FR X 11CM (SHEATH) ×4 IMPLANT
STENT XIENCE ALPINE RX 2.5X28 (Permanent Stent) ×4 IMPLANT
WIRE EMERALD 3MM-J .035X150CM (WIRE) ×4 IMPLANT
WIRE INTUITION PROPEL ST 180CM (WIRE) ×4 IMPLANT

## 2014-10-15 NOTE — Progress Notes (Signed)
Report called to nurse [place]. Pt gcs 15, denies cp, right groin myxnx site cdi w/o hematoma, peripheral iv 100 ml/hr infusing. Pt sitting up w/o complications noted.

## 2014-10-15 NOTE — Plan of Care (Signed)
Problem: Phase II Progression Outcomes Goal: Cath/PCI Day Path if indicated Outcome: Progressing Scheduled on 5/21

## 2014-10-15 NOTE — Progress Notes (Signed)
[facility] [place] [facility]   PATIENT NAME: [name]    MR#:  [id number]  DATE OF BIRTH:  10/21/1980  SUBJECTIVE:  Denies any chest pain/SOB. Sitting in bed hoping to get cath done.  REVIEW OF SYSTEMS:  Review of Systems  Constitutional: Negative for fever, chills and weight loss.  HENT: Negative for nosebleeds and sore throat.   Eyes: Negative for blurred vision.  Respiratory: Negative for cough, shortness of breath and wheezing.   Cardiovascular: Negative for chest pain, orthopnea, leg swelling and PND.  Gastrointestinal: Negative for heartburn, nausea, vomiting, abdominal pain, diarrhea and constipation.  Genitourinary: Negative for dysuria and urgency.  Musculoskeletal: Negative for back pain.  Skin: Negative for rash.  Neurological: Negative for dizziness, speech change, focal weakness and headaches.  Endo/Heme/Allergies: Does not bruise/bleed easily.  Psychiatric/Behavioral: Negative for depression.    DRUG ALLERGIES:  No Known Allergies  VITALS:  Blood pressure 124/83, pulse 81, temperature 98.2 F (36.8 C), temperature source Oral, resp. rate 18, height 5' (1.524 m), weight 77.565 kg (171 lb), last menstrual period 10/07/2014, SpO2 97 %.  PHYSICAL EXAMINATION:  GENERAL:  34 y.o.-year-old patient lying in the bed with no acute distress.  EYES: Pupils equal, round, reactive to light and accommodation. No scleral icterus. Extraocular muscles intact.  HEENT: Head atraumatic, normocephalic. Oropharynx and nasopharynx clear.  NECK:  Supple, no jugular venous distention. No thyroid enlargement, no tenderness.  LUNGS: Normal breath sounds bilaterally, no wheezing, rales,rhonchi or crepitation. No use of accessory muscles of respiration.  CARDIOVASCULAR: S1, S2 normal. No murmurs, rubs, or gallops.  ABDOMEN: Soft, nontender, nondistended. Bowel sounds present. No organomegaly or mass.  EXTREMITIES: No pedal edema, cyanosis, or clubbing.   NEUROLOGIC: Cranial nerves II through XII are intact. Muscle strength 5/5 in all extremities. Sensation intact. Gait not checked.  PSYCHIATRIC: The patient is alert and oriented x 3.  SKIN: No obvious rash, lesion, or ulcer.    LABORATORY PANEL:   CBC  Recent Labs Lab 10/14/14 0826  WBC 10.8  HGB 13.2  HCT 39.9  PLT 244   ------------------------------------------------------------------------------------------------------------------  Chemistries   Recent Labs Lab 10/14/14 0826 10/15/14 0557  NA 140 136  K 3.8 3.9  CL 109 101  CO2 24 28  GLUCOSE 121* 109*  BUN 18 18  CREATININE 0.74 0.75  CALCIUM 8.7* 9.4  AST 20  --   ALT 9*  --   ALKPHOS 50  --   BILITOT 0.6  --    ------------------------------------------------------------------------------------------------------------------  Cardiac Enzymes  Recent Labs Lab 10/14/14 1658  TROPONINI 0.95*   ------------------------------------------------------------------------------------------------------------------  RADIOLOGY:  Dg Chest Portable 1 View  10/14/2014   CLINICAL DATA:  Difficulty breathing starting this morning  EXAM: PORTABLE CHEST - 1 VIEW  COMPARISON:  02/20/2014  FINDINGS: Cardiomediastinal silhouette is stable. No acute infiltrate or pleural effusion. No pulmonary edema. Bony thorax is unremarkable.  IMPRESSION: No active disease.   Electronically Signed   By: Natasha MeadLiviu  Pop M.D.   On: 10/14/2014 09:06    ASSESSMENT AND PLAN:   34 year old female with a history of coronary artery disease status post stent in the RCA, tobacco dependence, hypertension and hyperlipidemia who presents with midsternal chest pain radiating to her throat which was of breath.  1 non-ST elevation MI: continue her outpatient medications including Plavix, aspirin, metoprolol and statin. Cardiology planning cath today. On lovenox  2. Accelerated hypertension: better now. continue metoprolol, Benzapril and HCTZ. PRN  hydralazine   3. Tobacco  dependence: Patient is encouraged to stop smoking. Patient does not want a nicotine patch. Patient was counseled for 3 minutes by Dr Juliene PinaMody.  4. Hyperlipidemia: continue with atorvastatin. Check FLP     All the records are reviewed and case discussed with Care Management/Social Workerr. Management plans discussed with the patient, family and they are in agreement.  CODE STATUS: full code  TOTAL TIME TAKING CARE OF THIS PATIENT: 35 minutes.   POSSIBLE D/C IN 1-2 DAYS, DEPENDING ON CLINICAL CONDITION AND CARDIO W/UP.   [facility], Alzada Brazee M.D on 10/15/2014 at 2:22 PM  Between 7am to 6pm - Pager - [phone]  After 6pm go to [email or web address] - password [email or web address] [facility]  [facility] [place] [facility]  Office  [phone]  CC: Primary care physician; WHITE, Arlyss RepressELIZABETH BURNEY, NP

## 2014-10-15 NOTE — Progress Notes (Signed)
Pt underwent cardiac cath revealing 99% mid to distal rca stenosis with timi 3 flow. Will need pci of rca.

## 2014-10-15 NOTE — Progress Notes (Signed)
Late entry:   Skin assessment done with Jennette Dubinoll F.

## 2014-10-15 NOTE — Progress Notes (Signed)
[facility]  SUBJECTIVE: Patient has no further chest pain. Has ruled in for non-ST elevation myocardial infarction with a serum troponin of 0.95. Risk and benefits of left cardiac catheterization were explained to the patient and she agrees to proceed. We'll proceed the left cardiac catheter to evaluate coronary anatomy in a patient with rest chest pain, history of a drug-eluting stent in the right coronary as well as positive serum troponin.   Filed Vitals:   10/14/14 1840 10/14/14 1939 10/15/14 0446 10/15/14 0752  BP: 173/98 154/84 150/90 143/76  Pulse: 72 97 87 73  Temp:  98 F (36.7 C) 97.8 F (36.6 C)   TempSrc:  Oral Oral Oral  Resp: 18 18 18 16   Height:      Weight:   77.565 kg (171 lb)   SpO2: 100% 99% 96% 98%    Intake/Output Summary (Last 24 hours) at 10/15/14 16100822 Last data filed at 10/15/14 0820  Gross per 24 hour  Intake      0 ml  Output   1800 ml  Net  -1800 ml    LABS: Basic Metabolic Panel:  Recent Labs  96/08/5403/19/16 0826 10/15/14 0557  NA 140 136  K 3.8 3.9  CL 109 101  CO2 24 28  GLUCOSE 121* 109*  BUN 18 18  CREATININE 0.74 0.75  CALCIUM 8.7* 9.4   Liver Function Tests:  Recent Labs  10/14/14 0826  AST 20  ALT 9*  ALKPHOS 50  BILITOT 0.6  PROT 7.2  ALBUMIN 3.7   No results for input(s): LIPASE, AMYLASE in the last 72 hours. CBC:  Recent Labs  10/14/14 0826  WBC 10.8  NEUTROABS 6.4  HGB 13.2  HCT 39.9  MCV 89.1  PLT 244   Cardiac Enzymes:  Recent Labs  10/14/14 0826 10/14/14 1116 10/14/14 1658  CKTOTAL  --   --  480*  CKMB  --   --  84.6*  TROPONINI <0.03 0.06* 0.95*   BNP: Invalid input(s): POCBNP D-Dimer: No results for input(s): DDIMER in the last 72 hours. Hemoglobin A1C: No results for input(s): HGBA1C in the last 72 hours. Fasting Lipid Panel:  Recent Labs  10/15/14 0557  CHOL 214*  HDL 38*  LDLCALC NOT CALCULATED  TRIG 686*  CHOLHDL 5.6   Thyroid Function  Tests: No results for input(s): TSH, T4TOTAL, T3FREE, THYROIDAB in the last 72 hours.  Invalid input(s): FREET3 Anemia Panel: No results for input(s): VITAMINB12, FOLATE, FERRITIN, TIBC, IRON, RETICCTPCT in the last 72 hours.   PHYSICAL EXAM General: Well developed, well nourished, in no acute distress HEENT:  Normocephalic and atramatic Neck:  No JVD.  Lungs: Clear bilaterally to auscultation and percussion. Heart: HRRR . Normal S1 and S2 without gallops or murmurs.  Abdomen: Bowel sounds are positive, abdomen soft and non-tender  Msk:  Back normal, normal gait. Normal strength and tone for age. Extremities: No clubbing, cyanosis or edema.   Neuro: Alert and oriented X 3. Psych:  Good affect, responds appropriately  TELEMETRY: Reviewed telemetry pt in sinus rhythm with no arrhythmia:  ASSESSMENT AND PLAN: #1. Non-ST elevation myocardial infarction-will continue aspirin, beta blocker, Plavix, and pursue left cardiac catheterization to evaluate coronary anatomy and make further treatment recommendations #2. Further recommendations after cardiac catheterization Active Problems:   NSTEMI (non-ST elevated myocardial infarction)    Dalia HeadingFATH,Cahterine Heinzel A., MD, [facility] 10/15/2014 8:22 AM

## 2014-10-16 LAB — CBC
HEMATOCRIT: 43.4 % (ref 35.0–47.0)
Hemoglobin: 14.3 g/dL (ref 12.0–16.0)
MCH: 29.3 pg (ref 26.0–34.0)
MCHC: 32.9 g/dL (ref 32.0–36.0)
MCV: 89 fL (ref 80.0–100.0)
PLATELETS: 238 10*3/uL (ref 150–440)
RBC: 4.88 MIL/uL (ref 3.80–5.20)
RDW: 13.1 % (ref 11.5–14.5)
WBC: 9.5 10*3/uL (ref 3.6–11.0)

## 2014-10-16 LAB — GLUCOSE, CAPILLARY
Glucose-Capillary: 135 mg/dL — ABNORMAL HIGH (ref 65–99)
Glucose-Capillary: 118 mg/dL — ABNORMAL HIGH (ref 65–99)

## 2014-10-16 LAB — BASIC METABOLIC PANEL
ANION GAP: 7 (ref 5–15)
BUN: 18 mg/dL (ref 6–20)
CALCIUM: 9.5 mg/dL (ref 8.9–10.3)
CHLORIDE: 103 mmol/L (ref 101–111)
CO2: 27 mmol/L (ref 22–32)
Creatinine, Ser: 0.78 mg/dL (ref 0.44–1.00)
GFR calc Af Amer: >60 mL/min (ref >60)
GFR calc non Af Amer: >60 mL/min (ref >60)
Glucose, Bld: 106 mg/dL — ABNORMAL HIGH (ref 65–99)
POTASSIUM: 3.9 mmol/L (ref 3.5–5.1)
Sodium: 137 mmol/L (ref 135–145)

## 2014-10-16 MED ORDER — CLOPIDOGREL BISULFATE 75 MG PO TABS: 75.0000 mg | ORAL_TABLET | Freq: Every day | ORAL | Status: DC

## 2014-10-16 MED ORDER — ATORVASTATIN CALCIUM 80 MG PO TABS: 80.0000 mg | ORAL_TABLET | Freq: Every day | ORAL | Status: DC

## 2014-10-16 MED ORDER — NITROGLYCERIN 0.4 MG SL SUBL: 0.4000 mg | SUBLINGUAL_TABLET | SUBLINGUAL | Status: DC | PRN

## 2014-10-16 MED ORDER — AMLODIPINE BESYLATE 5 MG PO TABS: 5.0000 mg | ORAL_TABLET | Freq: Every day | ORAL | Status: DC

## 2014-10-16 MED ORDER — BENAZEPRIL HCL 20 MG PO TABS: 20.0000 mg | ORAL_TABLET | Freq: Every day | ORAL | Status: DC

## 2014-10-16 MED ORDER — HYDROCHLOROTHIAZIDE 25 MG PO TABS: 25.0000 mg | ORAL_TABLET | Freq: Every day | ORAL | Status: DC

## 2014-10-16 MED ORDER — ASPIRIN EC 81 MG PO TBEC: 81.0000 mg | DELAYED_RELEASE_TABLET | Freq: Every day | ORAL | Status: DC

## 2014-10-16 MED ORDER — METOPROLOL TARTRATE 25 MG PO TABS: 12.5000 mg | ORAL_TABLET | Freq: Two times a day (BID) | ORAL | Status: DC

## 2014-10-16 MED ORDER — ASPIRIN EC 81 MG PO TBEC: 81.0000 mg | DELAYED_RELEASE_TABLET | Freq: Every day | ORAL | Status: AC

## 2014-10-16 MED ORDER — TRAZODONE HCL 50 MG PO TABS: 50.0000 mg | ORAL_TABLET | Freq: Every evening | ORAL | Status: DC | PRN

## 2014-10-16 NOTE — Discharge Summary (Signed)
[facility] - [place] at [facility]   PATIENT NAME: [name]    MR#:  [id number]  DATE OF BIRTH:  09-02-1980  DATE OF ADMISSION:  10/14/2014 ADMITTING PHYSICIAN: Adrian Saran, MD  DATE OF DISCHARGE: No discharge date for patient encounter.  PRIMARY CARE PHYSICIAN: WHITE, Arlyss Repress, NP    ADMISSION DIAGNOSIS:  NSTEMI (non-ST elevated myocardial infarction) [I21.4]  DISCHARGE DIAGNOSIS:  Active Problems:   NSTEMI (non-ST elevated myocardial infarction)   SECONDARY DIAGNOSIS:   Past Medical History  Diagnosis Date  . Hypertension   . Myocardial infarction     2014  . Hyperlipidemia    tobacco use disorder  HOSPITAL COURSE:   34 year old female with a history of coronary artery disease status post stent in the RCA, tobacco dependence, hypertension and hyperlipidemia who presents with midsternal chest pain.  1 non-ST elevation MI: Appreciate cardiology consult. Status post cardiac catheterization 10/15/14. Revealed a 99% RCA blockage distal to the previous stent. New drug-eluting stent placed in. continue her medications including Plavix, aspirin, metoprolol and statin. Discharge today. Will ambulate prior to discharge. Patient is asymptomatic. Recommend cardiac rehabilitation.  2. Accelerated hypertension: better now. continue Norvasc, metoprolol, Benzapril and HCTZ. PRN hydralazine   3. Tobacco dependence: Patient is encouraged to stop smoking. Counseled on admission.  4. Hyperlipidemia: continue with atorvastatin.  DISCHARGE CONDITIONS:   Stable  CONSULTS OBTAINED:  Treatment Team:  Dalia Heading, MD  DRUG ALLERGIES:  No Known Allergies  DISCHARGE MEDICATIONS:   Current Discharge Medication List    CONTINUE these medications which have CHANGED   Details  amLODipine (NORVASC) 5 MG tablet Take 1 tablet (5 mg total) by mouth daily. Qty: 30 tablet, Refills: 1    aspirin EC 81 MG tablet Take 1 tablet (81 mg total) by mouth  daily. Qty: 30 tablet, Refills: 1    atorvastatin (LIPITOR) 80 MG tablet Take 1 tablet (80 mg total) by mouth daily. Qty: 30 tablet, Refills: 1    benazepril (LOTENSIN) 20 MG tablet Take 1 tablet (20 mg total) by mouth daily. Qty: 30 tablet, Refills: 1    clopidogrel (PLAVIX) 75 MG tablet Take 1 tablet (75 mg total) by mouth daily. Qty: 30 tablet, Refills: 1    hydrochlorothiazide (HYDRODIURIL) 25 MG tablet Take 1 tablet (25 mg total) by mouth daily. Qty: 30 tablet, Refills: 1    metoprolol tartrate (LOPRESSOR) 25 MG tablet Take 0.5 tablets (12.5 mg total) by mouth 2 (two) times daily. Qty: 30 tablet, Refills: 1    nitroGLYCERIN (NITROSTAT) 0.4 MG SL tablet Place 1 tablet (0.4 mg total) under the tongue every 5 (five) minutes as needed. Qty: 30 tablet, Refills: 1    traZODone (DESYREL) 50 MG tablet Take 1 tablet (50 mg total) by mouth at bedtime as needed. Qty: 30 tablet, Refills: 1         DISCHARGE INSTRUCTIONS:    If you experience worsening of your admission symptoms, develop shortness of breath, life threatening emergency, suicidal or homicidal thoughts you must seek medical attention immediately by calling 911 or calling your MD immediately  if symptoms less severe.  You Must read complete instructions/literature along with all the possible adverse reactions/side effects for all the Medicines you take and that have been prescribed to you. Take any new Medicines after you have completely understood and accept all the possible adverse reactions/side effects.   Please note  You were cared for by a hospitalist during your hospital stay. If you have  any questions about your discharge medications or the care you received while you were in the hospital after you are discharged, you can call the unit and asked to speak with the hospitalist on call if the hospitalist that took care of you is not available. Once you are discharged, your primary care physician will handle any further  medical issues. Please note that NO REFILLS for any discharge medications will be authorized once you are discharged, as it is imperative that you return to your primary care physician (or establish a relationship with a primary care physician if you do not have one) for your aftercare needs so that they can reassess your need for medications and monitor your lab values.    Today   CHIEF COMPLAINT:   Chief Complaint  Patient presents with  . Chest Pain    VITAL SIGNS:  Blood pressure 112/72, pulse 88, temperature 98.3 F (36.8 C), temperature source Oral, resp. rate 19, height 5' (1.524 m), weight 76.93 kg (169 lb 9.6 oz), last menstrual period 10/07/2014, SpO2 95 %.  I/O:   Intake/Output Summary (Last 24 hours) at 10/16/14 1031 Last data filed at 10/16/14 0830  Gross per 24 hour  Intake    550 ml  Output   2350 ml  Net  -1800 ml    PHYSICAL EXAMINATION:   Physical Exam  GENERAL:  34 y.o.-year-old patient lying in the bed with no acute distress.  EYES: Pupils equal, round, reactive to light and accommodation. No scleral icterus. Extraocular muscles intact.  HEENT: Head atraumatic, normocephalic. Oropharynx and nasopharynx clear.  NECK:  Supple, no jugular venous distention. No thyroid enlargement, no tenderness.  LUNGS: Normal breath sounds bilaterally, no wheezing, rales,rhonchi or crepitation. No use of accessory muscles of respiration.  CARDIOVASCULAR: S1, S2 normal. No murmurs, rubs, or gallops.  ABDOMEN: Soft, non-tender, non-distended. Bowel sounds present. No organomegaly or mass.  EXTREMITIES: No pedal edema, cyanosis, or clubbing.  NEUROLOGIC: Cranial nerves II through XII are intact. Muscle strength 5/5 in all extremities. Sensation intact. Gait not checked.  PSYCHIATRIC: The patient is alert and oriented x 3.  SKIN: No obvious rash, lesion, or ulcer.   DATA REVIEW:   CBC  Recent Labs Lab 10/16/14 0356  WBC 9.5  HGB 14.3  HCT 43.4  PLT 238     Chemistries   Recent Labs Lab 10/14/14 0826  10/16/14 0356  NA 140  < > 137  K 3.8  < > 3.9  CL 109  < > 103  CO2 24  < > 27  GLUCOSE 121*  < > 106*  BUN 18  < > 18  CREATININE 0.74  < > 0.78  CALCIUM 8.7*  < > 9.5  AST 20  --   --   ALT 9*  --   --   ALKPHOS 50  --   --   BILITOT 0.6  --   --   < > = values in this interval not displayed.  Cardiac Enzymes  Recent Labs Lab 10/14/14 1658  TROPONINI 0.95*    Microbiology Results  Results for orders placed or performed in visit on 12/04/12  Wound culture     Status: None   Collection Time: 12/04/12  4:22 PM  Result Value Ref Range Status   Micro Text Report   Final       SOURCE: LEFT ARMPIT    ORGANISM 1  MODERATE GROWTH COAGULASE NEGATIVE STAPHYLOCOCCUS   COMMENT                   CALL LAB IF SENSITIVITY TESTING REQUIRED   COMMENT                   NO ANAEROBES ISOLATED IN 4 DAYS   GRAM STAIN                MANY WHITE BLOOD CELLS   GRAM STAIN                MODERATE GRAM POSITIVE COCCI   GRAM STAIN                SEEN SOME INTRACELLULAR GRAM POSITIVE COCCI   ANTIBIOTIC                                                        RADIOLOGY:  No results found.  EKG:   Orders placed or performed during the hospital encounter of 10/14/14  . ED EKG  . ED EKG  . EKG 12-Lead  . EKG 12-Lead  . EKG  . EKG 12-Lead immediately post procedure  . EKG 12-Lead  . EKG 12-Lead immediately post procedure  . EKG 12-Lead      Management plans discussed with the patient, family and they are in agreement.  CODE STATUS:     Code Status Orders        Start     Ordered   10/15/14 1450  Full code   Continuous     10/15/14 1451      TOTAL TIME TAKING CARE OF THIS PATIENT: 40 minutes.    Enid Baas M.D on 10/16/2014 at 10:31 AM  Between 7am to 6pm - Pager - [phone]  After 6pm go to [email or web address] - password [email or web address] [facility]  [place] [facility]  Office   [phone]  CC: Primary care physician; WHITE, Arlyss Repress, NP

## 2014-10-16 NOTE — Discharge Summary (Signed)
     [name] was admitted to the Hospital on 10/14/2014 for an acute cardiac condition and is being Discharged on5/21/2016 and should be excused from wfor at least 2 weeks starting 10/14/2014 . She should be able to return to work after 2 weeks if agreed upon by her cardiologist on her follow-up appointment.   Call Enid Baasadhika Laelynn Blizzard  MD, [facility] at  [phone] with questions.  Enid BaasKALISETTI,Rosey Eide M.D on 10/16/2014,at 10:29 AM

## 2014-10-16 NOTE — Progress Notes (Signed)
[facility] - Person at [facility]   PATIENT NAME: [name]    MR#:  [id number]  DATE OF BIRTH:  [birth year]  SUBJECTIVE:  S/p cardiac cath, RCA stent placed yesterday for 99% blockage. Doing well. No symptoms now.  For discharge today.  REVIEW OF SYSTEMS:  Review of Systems  Constitutional: Negative for fever and chills.  Respiratory: Negative for cough, shortness of breath and wheezing.   Cardiovascular: Negative for chest pain and palpitations.  Gastrointestinal: Negative for nausea, vomiting, abdominal pain, diarrhea and constipation.  Genitourinary: Negative for dysuria.  Neurological: Negative for dizziness, seizures and headaches.    DRUG ALLERGIES:  No Known Allergies  VITALS:  Blood pressure 112/72, pulse 88, temperature 98.3 F (36.8 C), temperature source Oral, resp. rate 19, height 5' (1.524 m), weight 76.93 kg (169 lb 9.6 oz), last menstrual period 10/07/2014, SpO2 95 %.  PHYSICAL EXAMINATION:  GENERAL:  34 y.o.-year-old patient lying in the bed with no acute distress.  EYES: Pupils equal, round, reactive to light and accommodation. No scleral icterus. Extraocular muscles intact.  HEENT: Head atraumatic, normocephalic. Oropharynx and nasopharynx clear.  NECK:  Supple, no jugular venous distention. No thyroid enlargement, no tenderness.  LUNGS: Normal breath sounds bilaterally, no wheezing, rales,rhonchi or crepitation. No use of accessory muscles of respiration.  CARDIOVASCULAR: S1, S2 normal. No murmurs, rubs, or gallops.  ABDOMEN: Soft, nontender, nondistended. Bowel sounds present. No organomegaly or mass.  EXTREMITIES: No pedal edema, cyanosis, or clubbing.  NEUROLOGIC: Cranial nerves II through XII are intact. Muscle strength 5/5 in all extremities. Sensation intact. Gait not checked.  PSYCHIATRIC: The patient is alert and oriented x 3.  SKIN: No obvious rash, lesion, or ulcer.    LABORATORY PANEL:   CBC  Recent Labs Lab  10/16/14 0356  WBC 9.5  HGB 14.3  HCT 43.4  PLT 238   ------------------------------------------------------------------------------------------------------------------  Chemistries   Recent Labs Lab 10/14/14 0826  10/16/14 0356  NA 140  < > 137  K 3.8  < > 3.9  CL 109  < > 103  CO2 24  < > 27  GLUCOSE 121*  < > 106*  BUN 18  < > 18  CREATININE 0.74  < > 0.78  CALCIUM 8.7*  < > 9.5  AST 20  --   --   ALT 9*  --   --   ALKPHOS 50  --   --   BILITOT 0.6  --   --   < > = values in this interval not displayed. ------------------------------------------------------------------------------------------------------------------  Cardiac Enzymes  Recent Labs Lab 10/14/14 1658  TROPONINI 0.95*   ------------------------------------------------------------------------------------------------------------------  RADIOLOGY:  No results found.  ASSESSMENT AND PLAN:   34 year old female with a history of coronary artery disease status post stent in the RCA, tobacco dependence, hypertension and hyperlipidemia who presents with midsternal chest pain radiating to her throat which was of breath.  1 non-ST elevation MI: Appreciate cardiology consult. Status post cardiac catheterization yesterday. Revealed a 99% RCA blockage distal to the previous stent. New drug-eluting stents placed in. continue her medications including Plavix, aspirin, metoprolol and statin. Possible discharge per cardiology. Will ambulate prior to discharge. Patient is asymptomatic. Recommend cardiac rehabilitation.  2. Accelerated hypertension: better now. continue metoprolol, Benzapril and HCTZ. PRN hydralazine   3. Tobacco dependence: Patient is encouraged to stop smoking. Counseled on admission.  4. Hyperlipidemia: continue with atorvastatin.   All the records are reviewed and case discussed with Care Management/Social Workerr. Management  plans discussed with the patient, family and they are in  agreement.  CODE STATUS: full code  TOTAL TIME TAKING CARE OF THIS PATIENT: 35 minutes.   POSSIBLE D/C TODAY, DEPENDING ON CLINICAL CONDITION AND CARDIO W/UP.   Enid BaasKALISETTI,Laurence Folz M.D on 10/16/2014 at 10:20 AM  Between 7am to 6pm - Pager - [phone]  After 6pm go to [email or web address] - password [email or web address]  [facility]  Office  [phone]  CC: Primary care physician; WHITE, Arlyss RepressELIZABETH BURNEY, NP

## 2014-10-16 NOTE — Progress Notes (Signed)
[facility]  SUBJECTIVE: Patient admitted with complaints of midsternal chest pain. She has a history of PCI in the right coronary artery in 2013. She has been treated with aspirin and compare to [facility] and reports compliance with this. She however continues to smoke cigarettes. She ruled in for non-ST elevation myocardial infarction with serum troponin of 0.95. She underwent left cardiac catheterization which revealed a 99% mid to distal RCA stenosis distal to the previous stent. She underwent placement of a drug-eluting stent in that area with good results. She had no complications of the procedure. She is able to be discharged today on previous medications which need to include aspirin at 81 mg daily, Plavix at 75 mg daily, atorvastatin 40 mg daily, amlodipine at 5 mg daily, hydrochlorothiazide at 25 mg daily, metoprolol titrated 12.5 mg twice daily. Smoking cessation is recommended. Patient is recommended to enroll in cardiac rehabilitation.   Filed Vitals:   10/15/14 1706 10/15/14 1840 10/15/14 1925 10/16/14 0427  BP: 140/95 131/77 121/71 112/72  Pulse:  92 94 88  Temp:  98 F (36.7 C) 97.8 F (36.6 C) 98.3 F (36.8 C)  TempSrc:  Oral Oral Oral  Resp:  Height:      Weight:    76.93 kg (169 lb 9.6 oz)  SpO2:  100% 100% 95%    Intake/Output Summary (Last 24 hours) at 10/16/14 0910 Last data filed at 10/16/14 1610  Gross per 24 hour  Intake    550 ml  Output   2600 ml  Net  -2050 ml    LABS: Basic Metabolic Panel:  Recent Labs  96/04/54 0557 10/16/14 0356  NA 136 137  K 3.9 3.9  CL 101 103  CO2 28 27  GLUCOSE 109* 106*  BUN 18 18  CREATININE 0.75 0.78  CALCIUM 9.4 9.5   Liver Function Tests:  Recent Labs  10/14/14 0826  AST 20  ALT 9*  ALKPHOS 50  BILITOT 0.6  PROT 7.2  ALBUMIN 3.7   No results for input(s): LIPASE, AMYLASE in the last 72 hours. CBC:  Recent Labs  10/14/14 0826 10/16/14 0356  WBC 10.8 9.5   NEUTROABS 6.4  --   HGB 13.2 14.3  HCT 39.9 43.4  MCV 89.1 89.0  PLT 244 238   Cardiac Enzymes:  Recent Labs  10/14/14 0826 10/14/14 1116 10/14/14 1658  CKTOTAL  --   --  480*  CKMB  --   --  84.6*  TROPONINI <0.03 0.06* 0.95*   BNP: Invalid input(s): POCBNP D-Dimer: No results for input(s): DDIMER in the last 72 hours. Hemoglobin A1C: No results for input(s): HGBA1C in the last 72 hours. Fasting Lipid Panel:  Recent Labs  10/15/14 0557  CHOL 214*  HDL 38*  LDLCALC NOT CALCULATED  TRIG 686*  CHOLHDL 5.6   Thyroid Function Tests: No results for input(s): TSH, T4TOTAL, T3FREE, THYROIDAB in the last 72 hours.  Invalid input(s): FREET3 Anemia Panel: No results for input(s): VITAMINB12, FOLATE, FERRITIN, TIBC, IRON, RETICCTPCT in the last 72 hours.   PHYSICAL EXAM General: Well developed, well nourished, in no acute distress HEENT:  Normocephalic and atramatic Neck:  No JVD.  Lungs: Clear bilaterally to auscultation and percussion. Heart: HRRR . Normal S1 and S2 without gallops or murmurs.  Abdomen: Bowel sounds are positive, abdomen soft and non-tender  Msk:  Back normal, normal gait. Normal strength and tone for age. Extremities: No clubbing, cyanosis or edema.  Neuro: Alert and oriented X 3. Psych:  Good affect, responds appropriately  TELEMETRY: Reviewed telemetry pt in normal sinus rhythm:  ASSESSMENT AND PLAN:  Active Problems:   NSTEMI (non-ST elevated myocardial infarction)  #1. Non-ST elevation myocardial infarction-status post PCI of the mid to distal RCA with a drug-eluting stent. Doing well. Plan to discharge on aforementioned medications including dual antiplatelet therapy with aspirin and Plavix. OK for discharge today with follow up with Dr. Duard LarsenKowalskiFATH,KENNETH A., MD, [facility] 10/16/2014 9:10 AM

## 2014-10-16 NOTE — Progress Notes (Signed)
No bleeding or bruising noted to the right groin. Medicated with PRN morphine for c/o  Incision (right groin) pain. Husband at bedside.  Remain NSR on the heart monitor.

## 2014-10-16 NOTE — Discharge Instructions (Signed)
°  DIET:  Cardiac diet  DISCHARGE CONDITION:  Good  ACTIVITY:  Activity as tolerated  OXYGEN:  Home Oxygen: No.   Oxygen Delivery: room air  DISCHARGE LOCATION:  Home  RECOMMEND CARDIAC REHAB.   If you experience worsening of your admission symptoms, develop shortness of breath, life threatening emergency, suicidal or homicidal thoughts you must seek medical attention immediately by calling 911 or calling your MD immediately  if symptoms less severe.  You Must read complete instructions/literature along with all the possible adverse reactions/side effects for all the Medicines you take and that have been prescribed to you. Take any new Medicines after you have completely understood and accpet all the possible adverse reactions/side effects.   Please note  You were cared for by a hospitalist during your hospital stay. If you have any questions about your discharge medications or the care you received while you were in the hospital after you are discharged, you can call the unit and asked to speak with the hospitalist on call if the hospitalist that took care of you is not available. Once you are discharged, your primary care physician will handle any further medical issues. Please note that NO REFILLS for any discharge medications will be authorized once you are discharged, as it is imperative that you return to your primary care physician (or establish a relationship with a primary care physician if you do not have one) for your aftercare needs so that they can reassess your need for medications and monitor your lab values.

## 2014-10-18 ENCOUNTER — Encounter: Payer: Self-pay | Admitting: Cardiology

## 2015-02-28 ENCOUNTER — Emergency Department: Payer: Self-pay

## 2015-02-28 ENCOUNTER — Inpatient Hospital Stay
Admission: EM | Admit: 2015-02-28 | Discharge: 2015-03-02 | DRG: 494 | Disposition: A | Discharge status: Home / Self Care | Payer: Self-pay | Attending: Orthopedic Surgery | Admitting: Orthopedic Surgery

## 2015-02-28 ENCOUNTER — Encounter: Payer: Self-pay | Admitting: Emergency Medicine

## 2015-02-28 DIAGNOSIS — Z7982 Long term (current) use of aspirin: Secondary | ICD-10-CM

## 2015-02-28 DIAGNOSIS — I2510 Atherosclerotic heart disease of native coronary artery without angina pectoris: Secondary | ICD-10-CM | POA: Diagnosis present

## 2015-02-28 DIAGNOSIS — I252 Old myocardial infarction: Secondary | ICD-10-CM

## 2015-02-28 DIAGNOSIS — S82853A Displaced trimalleolar fracture of unspecified lower leg, initial encounter for closed fracture: Secondary | ICD-10-CM | POA: Diagnosis present

## 2015-02-28 DIAGNOSIS — W1789XA Other fall from one level to another, initial encounter: Secondary | ICD-10-CM | POA: Diagnosis present

## 2015-02-28 DIAGNOSIS — Z8781 Personal history of (healed) traumatic fracture: Secondary | ICD-10-CM

## 2015-02-28 DIAGNOSIS — Z833 Family history of diabetes mellitus: Secondary | ICD-10-CM

## 2015-02-28 DIAGNOSIS — Z9889 Other specified postprocedural states: Secondary | ICD-10-CM

## 2015-02-28 DIAGNOSIS — S82851A Displaced trimalleolar fracture of right lower leg, initial encounter for closed fracture: Principal | ICD-10-CM | POA: Diagnosis present

## 2015-02-28 DIAGNOSIS — Z9851 Tubal ligation status: Secondary | ICD-10-CM

## 2015-02-28 DIAGNOSIS — Z955 Presence of coronary angioplasty implant and graft: Secondary | ICD-10-CM

## 2015-02-28 DIAGNOSIS — F17200 Nicotine dependence, unspecified, uncomplicated: Secondary | ICD-10-CM | POA: Diagnosis present

## 2015-02-28 DIAGNOSIS — Z8249 Family history of ischemic heart disease and other diseases of the circulatory system: Secondary | ICD-10-CM

## 2015-02-28 DIAGNOSIS — I10 Essential (primary) hypertension: Secondary | ICD-10-CM | POA: Diagnosis present

## 2015-02-28 DIAGNOSIS — E782 Mixed hyperlipidemia: Secondary | ICD-10-CM | POA: Diagnosis present

## 2015-02-28 DIAGNOSIS — Z79899 Other long term (current) drug therapy: Secondary | ICD-10-CM

## 2015-02-28 MED ORDER — TRAZODONE HCL 50 MG PO TABS
50.0000 mg | ORAL_TABLET | Freq: Every day | ORAL | Status: DC
  Administered 2015-02-28 – 2015-03-01 (×2): 50 mg via ORAL
  Filled 2015-02-28 (×2): qty 1

## 2015-02-28 MED ORDER — METOPROLOL TARTRATE 25 MG PO TABS
12.5000 mg | ORAL_TABLET | Freq: Two times a day (BID) | ORAL | Status: DC
  Administered 2015-02-28 – 2015-03-02 (×5): 12.5 mg via ORAL
  Filled 2015-02-28: qty 2
  Filled 2015-02-28 (×4): qty 1

## 2015-02-28 MED ORDER — SODIUM CHLORIDE 0.9 % IV SOLN
INTRAVENOUS | Status: DC
  Administered 2015-02-28 – 2015-03-01 (×2): via INTRAVENOUS

## 2015-02-28 MED ORDER — ATORVASTATIN CALCIUM 20 MG PO TABS
80.0000 mg | ORAL_TABLET | Freq: Every day | ORAL | Status: DC
  Administered 2015-02-28 – 2015-03-02 (×2): 80 mg via ORAL
  Filled 2015-02-28 (×2): qty 4

## 2015-02-28 MED ORDER — HYDROMORPHONE HCL 1 MG/ML IJ SOLN
1.0000 mg | Freq: Once | INTRAMUSCULAR | Status: AC
  Administered 2015-02-28: 1 mg via INTRAMUSCULAR
  Filled 2015-02-28: qty 1

## 2015-02-28 MED ORDER — AMLODIPINE BESYLATE 5 MG PO TABS
5.0000 mg | ORAL_TABLET | Freq: Every day | ORAL | Status: DC
  Administered 2015-02-28 – 2015-03-02 (×3): 5 mg via ORAL
  Filled 2015-02-28 (×3): qty 1

## 2015-02-28 MED ORDER — CEFAZOLIN SODIUM-DEXTROSE 2-3 GM-% IV SOLR
2.0000 g | INTRAVENOUS | Status: DC
  Filled 2015-02-28: qty 50

## 2015-02-28 MED ORDER — OXYCODONE HCL 5 MG PO TABS
5.0000 mg | ORAL_TABLET | ORAL | Status: DC | PRN
  Administered 2015-02-28 – 2015-03-01 (×2): 5 mg via ORAL
  Filled 2015-02-28 (×2): qty 1

## 2015-02-28 MED ORDER — ASPIRIN EC 81 MG PO TBEC
81.0000 mg | DELAYED_RELEASE_TABLET | Freq: Every day | ORAL | Status: DC
  Administered 2015-02-28 – 2015-03-02 (×2): 81 mg via ORAL
  Filled 2015-02-28 (×2): qty 1

## 2015-02-28 MED ORDER — HYDROCHLOROTHIAZIDE 25 MG PO TABS
25.0000 mg | ORAL_TABLET | Freq: Every day | ORAL | Status: DC
  Administered 2015-02-28 – 2015-03-02 (×2): 25 mg via ORAL
  Filled 2015-02-28 (×2): qty 1

## 2015-02-28 MED ORDER — NITROGLYCERIN 0.4 MG SL SUBL
0.4000 mg | SUBLINGUAL_TABLET | SUBLINGUAL | Status: DC | PRN
Start: 2015-02-28 — End: 2015-03-02

## 2015-02-28 MED ORDER — BENAZEPRIL HCL 20 MG PO TABS
20.0000 mg | ORAL_TABLET | Freq: Every day | ORAL | Status: DC
  Administered 2015-02-28 – 2015-03-02 (×3): 20 mg via ORAL
  Filled 2015-02-28 (×3): qty 1

## 2015-02-28 MED ORDER — CLOPIDOGREL BISULFATE 75 MG PO TABS
75.0000 mg | ORAL_TABLET | Freq: Every day | ORAL | Status: DC
  Administered 2015-02-28: 75 mg via ORAL
  Filled 2015-02-28: qty 1

## 2015-02-28 MED ORDER — NICOTINE 14 MG/24HR TD PT24
14.0000 mg | MEDICATED_PATCH | Freq: Every day | TRANSDERMAL | Status: DC
  Administered 2015-02-28 – 2015-03-02 (×3): 14 mg via TRANSDERMAL
  Filled 2015-02-28 (×3): qty 1

## 2015-02-28 MED ORDER — MORPHINE SULFATE (PF) 2 MG/ML IV SOLN
2.0000 mg | INTRAVENOUS | Status: DC | PRN
  Administered 2015-02-28 – 2015-03-01 (×7): 2 mg via INTRAVENOUS
  Filled 2015-02-28 (×7): qty 1

## 2015-02-28 NOTE — ED Provider Notes (Signed)
[facility] Emergency Department Provider Note  ____________________________________________  Time seen: Approximately 9:01 AM  I have reviewed the triage vital signs and the nursing notes.   HISTORY  Chief Complaint Ankle Pain   HPI [name] is a 34 y.o. female who presents to the emergency department for right ankle pain. She states that she fell from her porch and twisted her right ankle. She is unable to bear weight on the right foot or ankle without significant pain. She denies previous history of ankle injury.   Past Medical History  Diagnosis Date  . Hypertension   . Myocardial infarction (HCC)     2014  . Hyperlipidemia     Patient Active Problem List   Diagnosis Date Noted  . Trimalleolar fracture of ankle, closed 02/28/2015  . NSTEMI (non-ST elevated myocardial infarction) (HCC) 10/14/2014    Past Surgical History  Procedure Laterality Date  . Cardiac surgery  Cardiac stent to right coronary artery  . Tubal ligation  2004  . Cardiac catheterization N/A 10/15/2014    Procedure: Left Heart Cath;  Surgeon: Dalia Heading, MD;  Location: [facility] INVASIVE CV LAB;  Service: Cardiovascular;  Laterality: N/A;    No current outpatient prescriptions on file.  Allergies Review of patient's allergies indicates no known allergies.  Family History  Problem Relation Age of Onset  . Diabetes Mother   . Hypertension Mother   . Diabetes Father   . Hypertension Father   . Heart attack Sister     Social History Social History  Substance Use Topics  . Smoking status: Current Every Day Smoker -- 1.00 packs/day    Types: Cigarettes  . Smokeless tobacco: Never Used  . Alcohol Use: Yes    Review of Systems Constitutional: No recent illness. Eyes: No visual changes. ENT: No sore throat. Cardiovascular: Denies chest pain or palpitations. Respiratory: Denies shortness of breath. Gastrointestinal: No abdominal pain.  Genitourinary: Negative  for dysuria. Musculoskeletal: Pain in right ankle Skin: Negative for rash. Neurological: Negative for headaches, focal weakness or numbness. 10-point ROS otherwise negative.  ____________________________________________   PHYSICAL EXAM:  VITAL SIGNS: ED Triage Vitals  Enc Vitals Group     BP 02/28/15 0833 139/75 mmHg     Pulse Rate 02/28/15 0833 100     Resp 02/28/15 0833 18     Temp 02/28/15 0833 97.8 F (36.6 C)     Temp Source 02/28/15 0833 Oral     SpO2 02/28/15 0833 98 %     Weight 02/28/15 0833 180 lb (81.647 kg)     Height 02/28/15 0833 5' (1.524 m)     Head Cir --      Peak Flow --      Pain Score 02/28/15 0842 9     Pain Loc --      Pain Edu? --      Excl. in GC? --     Constitutional: Alert and oriented. Well appearing and in no acute distress. Eyes: Conjunctivae are normal. EOMI. Head: Atraumatic. Nose: No congestion/rhinnorhea. Neck: No stridor.  Respiratory: Normal respiratory effort.   Musculoskeletal: Edema to both medial and lateral aspects of the right ankle and distal tibia. Neurologic:  Normal speech and language. No gross focal neurologic deficits are appreciated. Speech is normal. No gait instability. Skin:  Skin is warm, dry and intact. Atraumatic. Pulse: DP and PT 2+. Capillary refill <3. Psychiatric: Mood and affect are normal. Speech and behavior are normal.  ____________________________________________   LABS (  all labs ordered are listed, but only abnormal results are displayed)  Labs Reviewed - No data to display ____________________________________________  RADIOLOGY  Trimalleolar right ankle fracture with a subluxation of the talus in the right ankle. ____________________________________________   PROCEDURES  Procedure(s) performed:   SPLINT APPLICATION Date/Time: 12:56 PM Authorized by: Kem Boroughs Consent: Verbal consent obtained. Risks and benefits: risks, benefits and alternatives were discussed Consent given by:  patient Splint applied by: Mellody Dance, ER technician Location details: right ankle  Splint type: stirrup Supplies used: OCL and ACE Post-procedure: The splinted body part was neurovascularly unchanged following the procedure. Patient tolerance: Patient tolerated the procedure well with no immediate complications.      ____________________________________________   INITIAL IMPRESSION / ASSESSMENT AND PLAN / ED COURSE  Pertinent labs & imaging results that were available during my care of the patient were reviewed by me and considered in my medical decision making (see chart for details).  Pain well controlled with dilaudid while in the ER.   Patient to be admitted to Dr. Rosita Kea for surgical intervention tomorrow. Patient was advised and agrees to plan.  ____________________________________________   FINAL CLINICAL IMPRESSION(S) / ED DIAGNOSES  Final diagnoses:  Trimalleolar fracture, right, closed, initial encounter       Chinita Pester, FNP 02/28/15 1257  Emily Filbert, MD 02/28/15 1257

## 2015-02-28 NOTE — Progress Notes (Signed)
MD making rounds.  Said to feed the patient.  Order given for nicotine supplement.  Heart healthy diet ordered

## 2015-02-28 NOTE — ED Notes (Signed)
States she pain down from porch  Twisted right ankle

## 2015-02-28 NOTE — ED Notes (Signed)
Instructed to not eat or drink anything until seen by dr Rosita Kea. Family at bedside

## 2015-02-28 NOTE — Care Management Note (Signed)
Case Management Note  Patient Details  Name: [name] MRN: [id number] Date of Birth: 09-08-1980  Subjective/Objective:   33yo [name] was admitted 02/28/15 per right ankle pain/ankle fracture. Right ankle is currently splinted, and [name] is scheduled for a surgical repair of her right ankle on 03/01/15.  Pending a Cardiac Consult today per history of MI and cardiac stenting in 2014. PCP=Elizabeth White at [facility] in [place] where [name] pays out of pocket for care. [name]'s husband transports her to appointments so [facility] may be an option for PT for uninsured [name]. [name] was provided with applications to [facility] and [facility]. Case Management will follow for discharge planning.                  Action/Plan:   Expected Discharge Date:                  Expected Discharge Plan:     In-House Referral:     Discharge planning Services     Post Acute Care Choice:    Choice offered to:     DME Arranged:    DME Agency:     HH Arranged:    HH Agency:     Status of Service:     Medicare Important Message Given:    Date Medicare IM Given:    Medicare IM give by:    Date Additional Medicare IM Given:    Additional Medicare Important Message give by:     If discussed at Long Length of Stay Meetings, dates discussed:    Additional Comments:  Johnnye Sandford A, RN 02/28/2015, 1:38 PM

## 2015-02-28 NOTE — Consult Note (Signed)
[facility] Cardiology Consultation Note  Patient ID: [name], MRN: [id number], DOB/AGE: 1980/07/04 34 y.o. Admit date: 02/28/2015   Date of Consult: 02/28/2015 Primary Physician: WHITE, Arlyss Repress, NP Primary Cardiologist: Gwen Pounds  Chief Complaint:  Chief Complaint  Patient presents with  . Ankle Pain   Reason for Consult: known coronary artery disease with ankle fracture  HPI: 34 y.o. female with known hypertension hyperlipidemia and previous myocardial infarction with recent PCI and stent placement on appropriate medication. The patient has had appropriate medication is no evidence of congestive heart failure or anginal equivalent over the last several months. The patient has been off-and-on on medications due to poor ability to pay for her medications. She did step off step and have an ankle injury and need surgery. Currently there is no evidence of EKG changes or new concerns from the cardiovascular standpoint  Past Medical History  Diagnosis Date  . Hypertension   . Myocardial infarction (HCC)     2014  . Hyperlipidemia       Surgical History:  Past Surgical History  Procedure Laterality Date  . Cardiac surgery  Cardiac stent to right coronary artery  . Tubal ligation  2004  . Cardiac catheterization N/A 10/15/2014    Procedure: Left Heart Cath;  Surgeon: Dalia Heading, MD;  Location: [facility] INVASIVE CV LAB;  Service: Cardiovascular;  Laterality: N/A;     Home Meds: Prior to Admission medications   Medication Sig Start Date End Date Taking? Authorizing Provider  amLODipine (NORVASC) 5 MG tablet Take 1 tablet (5 mg total) by mouth daily. 10/16/14  Yes Enid Baas, MD  aspirin EC 81 MG tablet Take 1 tablet (81 mg total) by mouth daily. 10/16/14  Yes Enid Baas, MD  atorvastatin (LIPITOR) 80 MG tablet Take 1 tablet (80 mg total) by mouth daily. 10/16/14  Yes Enid Baas, MD  benazepril (LOTENSIN) 20 MG tablet Take 1 tablet (20 mg total) by mouth  daily. 10/16/14  Yes Enid Baas, MD  clopidogrel (PLAVIX) 75 MG tablet Take 1 tablet (75 mg total) by mouth daily. 10/16/14  Yes Enid Baas, MD  hydrochlorothiazide (HYDRODIURIL) 25 MG tablet Take 1 tablet (25 mg total) by mouth daily. 10/16/14  Yes Enid Baas, MD  metoprolol tartrate (LOPRESSOR) 25 MG tablet Take 0.5 tablets (12.5 mg total) by mouth 2 (two) times daily. 10/16/14 10/16/15 Yes Enid Baas, MD  nitroGLYCERIN (NITROSTAT) 0.4 MG SL tablet Place 1 tablet (0.4 mg total) under the tongue every 5 (five) minutes as needed. 10/16/14 10/16/15 Yes Enid Baas, MD  traZODone (DESYREL) 50 MG tablet Take 1 tablet (50 mg total) by mouth at bedtime as needed. 10/16/14  Yes Enid Baas, MD    Inpatient Medications:  . amLODipine  5 mg Oral Daily  . aspirin EC  81 mg Oral Daily  . atorvastatin  80 mg Oral Daily  . benazepril  20 mg Oral Daily  .  ceFAZolin (ANCEF) IV  2 g Intravenous On Call to OR  . clopidogrel  75 mg Oral Daily  . hydrochlorothiazide  25 mg Oral Daily  . metoprolol tartrate  12.5 mg Oral BID  . nicotine  14 mg Transdermal Daily  . traZODone  50 mg Oral QHS   . sodium chloride 75 mL/hr at 02/28/15 1125    Allergies: No Known Allergies  Social History   Social History  . Marital Status: Married    Spouse Name: N/A  . Number of Children: N/A  . Years of  Education: N/A   Occupational History  . Not on file.   Social History Main Topics  . Smoking status: Current Every Day Smoker -- 1.00 packs/day    Types: Cigarettes  . Smokeless tobacco: Never Used  . Alcohol Use: Yes  . Drug Use: No  . Sexual Activity: Not on file   Other Topics Concern  . Not on file   Social History Narrative     Family History  Problem Relation Age of Onset  . Diabetes Mother   . Hypertension Mother   . Diabetes Father   . Hypertension Father   . Heart attack Sister      Review of Systems Positive for ankle pain Negative for: General:   chills, fever, night sweats or weight changes.  Cardiovascular: PND orthopnea syncope dizziness  Dermatological skin lesions rashes Respiratory: Cough congestion Urologic: Frequent urination urination at night and hematuria Abdominal: negative for nausea, vomiting, diarrhea, bright red blood per rectum, melena, or hematemesis Neurologic: negative for visual changes, and/or hearing changes  All other systems reviewed and are otherwise negative except as noted above.  Labs: No results for input(s): CKTOTAL, CKMB, TROPONINI in the last 72 hours. Lab Results  Component Value Date   WBC 9.5 10/16/2014   HGB 14.3 10/16/2014   HCT 43.4 10/16/2014   MCV 89.0 10/16/2014   PLT 238 10/16/2014   No results for input(s): NA, K, CL, CO2, BUN, CREATININE, CALCIUM, PROT, BILITOT, ALKPHOS, ALT, AST, GLUCOSE in the last 168 hours.  Invalid input(s): LABALBU Lab Results  Component Value Date   CHOL 214* 10/15/2014   HDL 38* 10/15/2014   LDLCALC NOT CALCULATED 10/15/2014   TRIG 686* 10/15/2014   No results found for: DDIMER  Radiology/Studies:  Dg Ankle Complete Right  02/28/2015   CLINICAL DATA:  Fall from porch with right ankle pain and swelling.  EXAM: RIGHT ANKLE - COMPLETE 3+ VIEW  COMPARISON:  None.  FINDINGS: There is an oblique right lateral malleolus intra-articular fracture with 5 mm lateral displacement of the distal fracture fragment. There is a transverse intra-articular medial malleolus fracture with 5 mm lateral displacement of the distal fracture fragment. There is a likely nondisplaced posterior malleolar fracture in the posterior distal right tibial epiphysis. There is 5 mm lateral subluxation of the talus relative to the tibial plafond. There is prominent soft tissue swelling throughout the right ankle.  IMPRESSION: 1. Trimalleolar right ankle fractures. 2. Mild lateral subluxation of the talus in the right ankle as described.   Electronically Signed   By: Delbert Phenix M.D.   On:  02/28/2015 09:15    EKG: Normal sinus rhythm. Normal EKG  Weights: Filed Weights   02/28/15 0833  Weight: 180 lb (81.647 kg)     Physical Exam: Blood pressure 141/80, pulse 104, temperature 98.8 F (37.1 C), temperature source Axillary, resp. rate 16, height 5' (1.524 m), weight 180 lb (81.647 kg), last menstrual period 02/13/2015, SpO2 100 %. Body mass index is 35.15 kg/(m^2). General: Well developed, well nourished, in no acute distress. Head eyes ears nose throat: Normocephalic, atraumatic, sclera non-icteric, no xanthomas, nares are without discharge. No apparent thyromegaly and/or mass  Lungs: Normal respiratory effort.  no wheezes, no rales, no rhonchi.  Heart: RRR with normal S1 S2. no murmur gallop, no rub, PMI is normal size and placement, carotid upstroke normal without bruit, jugular venous pressure is normal Abdomen: Soft, non-tender, non-distended with normoactive bowel sounds. No hepatomegaly. No rebound/guarding. No obvious abdominal masses. Abdominal aorta  is normal size without bruit Extremities: No edema. no cyanosis, no clubbing, no ulcers  Peripheral : 2+ bilateral upper extremity pulses, 2+ bilateral femoral pulses, 2+ bilateral dorsal pedal pulse Neuro: Alert and oriented. No facial asymmetry. No focal deficit. Moves all extremities spontaneously. Musculoskeletal: Normal muscle tone without kyphosis Psych:  Responds to questions appropriately with a normal affect.    Assessment: 34 year old female with mixed hyperlipidemia essential hypertension with coronary artery disease and history of myocardial infarction currently without evidence of congestive heart failure elevated troponin and or true angina on appropriate medication management as possible for cardiovascular complication with surgery  Plan: 1. Discontinuation of Plavix to reduce possible bleeding complications 2. Continue aspirin throughout surgery to reduce cardiovascular in-stent thrombosis 3. No  further cardiac diagnostics necessary at this time 4. No restrictions to rehabilitation  Signed, Lamar Blinks M.D. [facility] 02/28/2015, 1:22 PM

## 2015-02-28 NOTE — H&P (Signed)
Subjective:   Patient is a 34 y.o. female presents with right ankle pain. Onset of symptoms was abrupt starting 2 hours ago with unchanged course since that time. The pain is located all around the ankle. Patient describes the pain as sharp continuous and rated as severe. Pain has been associated with recent injury where she was stepping off her porch and her ankle gave out. Patient denies loss of consciousness. Symptoms are aggravated by attempt to move the leg. Symptoms improve with splinting and elevation. Past history includes has been community ambulator without assistive device.  Previous studies include ankle x-rays in a year.  Patient Active Problem List   Diagnosis Date Noted  . Trimalleolar fracture of ankle, closed 02/28/2015  . NSTEMI (non-ST elevated myocardial infarction) (HCC) 10/14/2014   Past Medical History  Diagnosis Date  . Hypertension   . Myocardial infarction (HCC)     2014  . Hyperlipidemia     Past Surgical History  Procedure Laterality Date  . Cardiac surgery  Cardiac stent to right coronary artery  . Tubal ligation  2004  . Cardiac catheterization N/A 10/15/2014    Procedure: Left Heart Cath;  Surgeon: Dalia Heading, MD;  Location: [facility] INVASIVE CV LAB;  Service: Cardiovascular;  Laterality: N/A;    Prescriptions prior to admission  Medication Sig Dispense Refill Last Dose  . amLODipine (NORVASC) 5 MG tablet Take 1 tablet (5 mg total) by mouth daily. 30 tablet 1 02/27/2015 at Unknown time  . aspirin EC 81 MG tablet Take 1 tablet (81 mg total) by mouth daily. 30 tablet 1 02/27/2015 at Unknown time  . atorvastatin (LIPITOR) 80 MG tablet Take 1 tablet (80 mg total) by mouth daily. 30 tablet 1 02/27/2015 at Unknown time  . benazepril (LOTENSIN) 20 MG tablet Take 1 tablet (20 mg total) by mouth daily. 30 tablet 1 02/27/2015 at Unknown time  . clopidogrel (PLAVIX) 75 MG tablet Take 1 tablet (75 mg total) by mouth daily. 30 tablet 1 02/27/2015 at Unknown time  .  hydrochlorothiazide (HYDRODIURIL) 25 MG tablet Take 1 tablet (25 mg total) by mouth daily. 30 tablet 1 02/27/2015 at Unknown time  . metoprolol tartrate (LOPRESSOR) 25 MG tablet Take 0.5 tablets (12.5 mg total) by mouth 2 (two) times daily. 30 tablet 1 02/27/2015 at Unknown time  . nitroGLYCERIN (NITROSTAT) 0.4 MG SL tablet Place 1 tablet (0.4 mg total) under the tongue every 5 (five) minutes as needed. 30 tablet 1 prn at prn  . traZODone (DESYREL) 50 MG tablet Take 1 tablet (50 mg total) by mouth at bedtime as needed. 30 tablet 1 prn at prn   No Known Allergies  Social History  Substance Use Topics  . Smoking status: Current Every Day Smoker -- 1.00 packs/day    Types: Cigarettes  . Smokeless tobacco: Never Used  . Alcohol Use: Yes    Family History  Problem Relation Age of Onset  . Diabetes Mother   . Hypertension Mother   . Diabetes Father   . Hypertension Father   . Heart attack Sister     Review of Systems Pertinent items are noted in HPI.  Objective:   Patient Vitals for the past 8 hrs:  BP Temp Temp src Pulse Resp SpO2 Height Weight  02/28/15 1114 (!) 141/80 mmHg 98.8 F (37.1 C) Axillary (!) 104 16 100 % - -  02/28/15 0833 139/75 mmHg 97.8 F (36.6 C) Oral 100 18 98 % 5' (1.524 m) 81.647 kg (180 lb)  BP 141/80 mmHg  Pulse 104  Temp(Src) 98.8 F (37.1 C) (Axillary)  Resp 16  Ht 5' (1.524 m)  Wt 81.647 kg (180 lb)  BMI 35.15 kg/m2  SpO2 100%  LMP 02/13/2015 General appearance: alert and cooperative Head: Normocephalic, without obvious abnormality, atraumatic Eyes: conjunctivae/corneas clear. PERRL, EOM's intact. Fundi benign. Ears: normal TM's and external ear canals both ears Nose: Nares normal. Septum midline. Mucosa normal. No drainage or sinus tenderness. Lungs: clear to auscultation bilaterally Heart: regular rate and rhythm Abdomen: soft, non-tender; bowel sounds normal; no masses,  no organomegaly Extremities: Right leg is in splints  neurovascular intact able flex extend the toes with brisk capillary refill   Assessment:   Active Problems:   Trimalleolar fracture of ankle, closed   Plan:   ORIF with preop cardiology avail with her history of fairly recent heart catheterization and stent placement. Risks benefits possible competitions of the procedure were discussed at length with the patient

## 2015-03-01 ENCOUNTER — Inpatient Hospital Stay: Payer: Self-pay | Admitting: Certified Registered"

## 2015-03-01 ENCOUNTER — Inpatient Hospital Stay: Payer: MEDICAID | Admitting: Certified Registered"

## 2015-03-01 ENCOUNTER — Encounter
Admission: EM | Disposition: A | Discharge status: Home / Self Care | Payer: Self-pay | Source: Home / Self Care | Attending: Orthopedic Surgery

## 2015-03-01 ENCOUNTER — Inpatient Hospital Stay: Payer: Self-pay

## 2015-03-01 ENCOUNTER — Encounter: Payer: Self-pay | Admitting: Anesthesiology

## 2015-03-01 HISTORY — PX: ORIF ANKLE FRACTURE: SHX5408

## 2015-03-01 LAB — PREGNANCY, URINE: Preg Test, Ur: NEGATIVE

## 2015-03-01 LAB — MRSA PCR SCREENING: MRSA by PCR: NEGATIVE

## 2015-03-01 SURGERY — OPEN REDUCTION INTERNAL FIXATION (ORIF) ANKLE FRACTURE
Anesthesia: General | Laterality: Right

## 2015-03-01 MED ORDER — CEFAZOLIN SODIUM-DEXTROSE 2-3 GM-% IV SOLR
INTRAVENOUS | Status: DC | PRN
  Administered 2015-03-01: 2 g via INTRAVENOUS

## 2015-03-01 MED ORDER — MAGNESIUM HYDROXIDE 400 MG/5ML PO SUSP: 30.0000 mL | Freq: Every day | ORAL | Status: DC | PRN

## 2015-03-01 MED ORDER — BISACODYL 10 MG RE SUPP: 10.0000 mg | Freq: Every day | RECTAL | Status: DC | PRN

## 2015-03-01 MED ORDER — ZOLPIDEM TARTRATE 5 MG PO TABS: 5.0000 mg | ORAL_TABLET | Freq: Every evening | ORAL | Status: DC | PRN

## 2015-03-01 MED ORDER — OXYCODONE HCL 5 MG/5ML PO SOLN: 5.0000 mg | Freq: Once | ORAL | Status: DC | PRN

## 2015-03-01 MED ORDER — LIDOCAINE HCL (CARDIAC) 20 MG/ML IV SOLN
INTRAVENOUS | Status: DC | PRN
  Administered 2015-03-01: 50 mg via INTRAVENOUS

## 2015-03-01 MED ORDER — METHOCARBAMOL 1000 MG/10ML IJ SOLN: 500.0000 mg | Freq: Four times a day (QID) | INTRAVENOUS | Status: DC | PRN

## 2015-03-01 MED ORDER — KETAMINE HCL 50 MG/ML IJ SOLN
INTRAMUSCULAR | Status: DC | PRN
  Administered 2015-03-01: 25 mg via INTRAVENOUS

## 2015-03-01 MED ORDER — OXYCODONE HCL 5 MG PO TABS: 5.0000 mg | ORAL_TABLET | Freq: Once | ORAL | Status: DC | PRN

## 2015-03-01 MED ORDER — FENTANYL CITRATE (PF) 100 MCG/2ML IJ SOLN
INTRAMUSCULAR | Status: DC | PRN
  Administered 2015-03-01 (×5): 50 ug via INTRAVENOUS

## 2015-03-01 MED ORDER — DOCUSATE SODIUM 100 MG PO CAPS
100.0000 mg | ORAL_CAPSULE | Freq: Two times a day (BID) | ORAL | Status: DC
  Administered 2015-03-01 – 2015-03-02 (×3): 100 mg via ORAL
  Filled 2015-03-01 (×3): qty 1

## 2015-03-01 MED ORDER — ONDANSETRON HCL 4 MG/2ML IJ SOLN: 4.0000 mg | Freq: Four times a day (QID) | INTRAMUSCULAR | Status: DC | PRN

## 2015-03-01 MED ORDER — MIDAZOLAM HCL 2 MG/2ML IJ SOLN
INTRAMUSCULAR | Status: DC | PRN
  Administered 2015-03-01: 2 mg via INTRAVENOUS

## 2015-03-01 MED ORDER — CEFAZOLIN SODIUM-DEXTROSE 2-3 GM-% IV SOLR
2.0000 g | Freq: Four times a day (QID) | INTRAVENOUS | Status: AC
  Administered 2015-03-01 – 2015-03-02 (×3): 2 g via INTRAVENOUS
  Filled 2015-03-01 (×3): qty 50

## 2015-03-01 MED ORDER — OXYCODONE HCL 5 MG PO TABS
5.0000 mg | ORAL_TABLET | ORAL | Status: DC | PRN
  Administered 2015-03-01: 10 mg via ORAL
  Administered 2015-03-01: 5 mg via ORAL
  Administered 2015-03-01 – 2015-03-02 (×4): 10 mg via ORAL
  Filled 2015-03-01 (×5): qty 2
  Filled 2015-03-01: qty 1

## 2015-03-01 MED ORDER — DEXAMETHASONE SODIUM PHOSPHATE 4 MG/ML IJ SOLN
INTRAMUSCULAR | Status: DC | PRN
  Administered 2015-03-01: 5 mg via INTRAVENOUS

## 2015-03-01 MED ORDER — GLYCOPYRROLATE 0.2 MG/ML IJ SOLN
INTRAMUSCULAR | Status: DC | PRN
  Administered 2015-03-01: 0.2 mg via INTRAVENOUS

## 2015-03-01 MED ORDER — METHOCARBAMOL 500 MG PO TABS: 500.0000 mg | ORAL_TABLET | Freq: Four times a day (QID) | ORAL | Status: DC | PRN

## 2015-03-01 MED ORDER — ONDANSETRON HCL 4 MG PO TABS: 4.0000 mg | ORAL_TABLET | Freq: Four times a day (QID) | ORAL | Status: DC | PRN

## 2015-03-01 MED ORDER — SODIUM CHLORIDE 0.9 % IV SOLN
INTRAVENOUS | Status: DC
  Administered 2015-03-01 – 2015-03-02 (×2): via INTRAVENOUS

## 2015-03-01 MED ORDER — LACTATED RINGERS IV SOLN
INTRAVENOUS | Status: DC | PRN
  Administered 2015-03-01: 11:00:00 via INTRAVENOUS

## 2015-03-01 MED ORDER — MORPHINE SULFATE (PF) 2 MG/ML IV SOLN
2.0000 mg | INTRAVENOUS | Status: DC | PRN
  Administered 2015-03-01 – 2015-03-02 (×2): 2 mg via INTRAVENOUS
  Filled 2015-03-01 (×2): qty 1

## 2015-03-01 MED ORDER — METOCLOPRAMIDE HCL 5 MG PO TABS: 5.0000 mg | ORAL_TABLET | Freq: Three times a day (TID) | ORAL | Status: DC | PRN

## 2015-03-01 MED ORDER — HYDROMORPHONE HCL 1 MG/ML IJ SOLN
0.2500 mg | INTRAMUSCULAR | Status: DC | PRN
  Administered 2015-03-01 (×2): 0.25 mg via INTRAVENOUS
  Administered 2015-03-01 (×3): 0.5 mg via INTRAVENOUS

## 2015-03-01 MED ORDER — LABETALOL HCL 5 MG/ML IV SOLN
5.0000 mg | INTRAVENOUS | Status: DC | PRN
  Administered 2015-03-01 (×2): 5 mg via INTRAVENOUS
  Filled 2015-03-01 (×2): qty 4

## 2015-03-01 MED ORDER — MAGNESIUM CITRATE PO SOLN: 1.0000 | Freq: Once | ORAL | Status: DC | PRN

## 2015-03-01 MED ORDER — ONDANSETRON HCL 4 MG/2ML IJ SOLN
INTRAMUSCULAR | Status: DC | PRN
  Administered 2015-03-01: 4 mg via INTRAVENOUS

## 2015-03-01 MED ORDER — METOCLOPRAMIDE HCL 5 MG/ML IJ SOLN: 5.0000 mg | Freq: Three times a day (TID) | INTRAMUSCULAR | Status: DC | PRN

## 2015-03-01 MED ORDER — ACETAMINOPHEN 650 MG RE SUPP: 650.0000 mg | Freq: Four times a day (QID) | RECTAL | Status: DC | PRN

## 2015-03-01 MED ORDER — MORPHINE SULFATE (PF) 2 MG/ML IV SOLN
2.0000 mg | Freq: Once | INTRAVENOUS | Status: AC
  Administered 2015-03-01: 2 mg via INTRAVENOUS
  Filled 2015-03-01: qty 1

## 2015-03-01 MED ORDER — PROPOFOL 10 MG/ML IV BOLUS
INTRAVENOUS | Status: DC | PRN
  Administered 2015-03-01: 150 mg via INTRAVENOUS

## 2015-03-01 MED ORDER — SODIUM CHLORIDE 0.9 % IR SOLN
Status: DC | PRN
  Administered 2015-03-01: 100 mL

## 2015-03-01 MED ORDER — FENTANYL CITRATE (PF) 100 MCG/2ML IJ SOLN
25.0000 ug | INTRAMUSCULAR | Status: DC | PRN
  Administered 2015-03-01 (×4): 25 ug via INTRAVENOUS

## 2015-03-01 MED ORDER — ACETAMINOPHEN 325 MG PO TABS: 650.0000 mg | ORAL_TABLET | Freq: Four times a day (QID) | ORAL | Status: DC | PRN

## 2015-03-01 SURGICAL SUPPLY — 56 items
BANDAGE ELASTIC 4 CLIP ST LF (GAUZE/BANDAGES/DRESSINGS) ×3 IMPLANT
BIT DRILL 2.5X2.75 QC CALB (BIT) ×3 IMPLANT
BIT DRILL 2.9 CANN QC NONSTRL (BIT) ×3 IMPLANT
BIT DRILL 2.9X70 QC CALB (BIT) IMPLANT
BLADE SURG SZ10 CARB STEEL (BLADE) ×6 IMPLANT
BNDG ESMARK 4X12 TAN STRL LF (GAUZE/BANDAGES/DRESSINGS) IMPLANT
CANISTER SUCT 1200ML W/VALVE (MISCELLANEOUS) ×3 IMPLANT
CHLORAPREP W/TINT 26ML (MISCELLANEOUS) ×3 IMPLANT
DRAPE FLUOR MINI C-ARM 54X84 (DRAPES) ×3 IMPLANT
DRAPE INCISE IOBAN 66X45 STRL (DRAPES) ×3 IMPLANT
DRAPE U-SHAPE 47X51 STRL (DRAPES) IMPLANT
DRSG EMULSION OIL 3X8 NADH (GAUZE/BANDAGES/DRESSINGS) IMPLANT
ELECT CAUTERY BLADE 6.4 (BLADE) ×3 IMPLANT
GAUZE PETRO XEROFOAM 1X8 (MISCELLANEOUS) ×3 IMPLANT
GAUZE SPONGE 4X4 12PLY STRL (GAUZE/BANDAGES/DRESSINGS) ×3 IMPLANT
GLOVE BIOGEL PI IND STRL 9 (GLOVE) ×1 IMPLANT
GLOVE BIOGEL PI INDICATOR 9 (GLOVE) ×2
GLOVE INDICATOR 7.5 STRL GRN (GLOVE) ×3 IMPLANT
GLOVE SURG ORTHO 9.0 STRL STRW (GLOVE) ×3 IMPLANT
GOWN SPECIALTY ULTRA XL (MISCELLANEOUS) ×3 IMPLANT
GOWN STRL REUS W/ TWL LRG LVL3 (GOWN DISPOSABLE) ×1 IMPLANT
GOWN STRL REUS W/TWL LRG LVL3 (GOWN DISPOSABLE) ×2
HEMOVAC 400ML (MISCELLANEOUS)
K-WIRE ACE 1.6X6 (WIRE) ×6
KIT DRAIN HEMOVAC JP 7FR 400ML (MISCELLANEOUS) IMPLANT
KIT RM TURNOVER STRD PROC AR (KITS) ×3 IMPLANT
KWIRE ACE 1.6X6 (WIRE) ×2 IMPLANT
LABEL OR SOLS (LABEL) IMPLANT
NS IRRIG 1000ML POUR BTL (IV SOLUTION) ×3 IMPLANT
NS IRRIG 500ML POUR BTL (IV SOLUTION) ×3 IMPLANT
PACK EXTREMITY ARMC (MISCELLANEOUS) ×3 IMPLANT
PAD ABD DERMACEA PRESS 5X9 (GAUZE/BANDAGES/DRESSINGS) IMPLANT
PAD CAST CTTN 4X4 STRL (SOFTGOODS) ×2 IMPLANT
PAD GROUND ADULT SPLIT (MISCELLANEOUS) ×3 IMPLANT
PAD PREP 24X41 OB/GYN DISP (PERSONAL CARE ITEMS) ×3 IMPLANT
PADDING CAST COTTON 4X4 STRL (SOFTGOODS) ×4
PLATE TUB 100DEG 5 HO (Plate) ×3 IMPLANT
SCREW ACE CAN 4.0 36M (Screw) ×3 IMPLANT
SCREW CORTICAL 3.5MM  12MM (Screw) ×2 IMPLANT
SCREW CORTICAL 3.5MM 12MM (Screw) ×1 IMPLANT
SCREW CORTICAL 3.5MM 14MM (Screw) ×6 IMPLANT
SCREW NLOCK CANC HEX 4X14 (Screw) ×6 IMPLANT
SPLINT CAST 1 STEP 3X12 (MISCELLANEOUS) ×6 IMPLANT
SPLINT CAST 1 STEP 5X30 WHT (MISCELLANEOUS) IMPLANT
SPONGE LAP 18X18 5 PK (GAUZE/BANDAGES/DRESSINGS) ×3 IMPLANT
STAPLER SKIN PROX 35W (STAPLE) ×3 IMPLANT
STOCKINETTE STRL 6IN 960660 (GAUZE/BANDAGES/DRESSINGS) ×3 IMPLANT
SUT ETHILON 3-0 FS-10 30 BLK (SUTURE)
SUT MNCRL AB 4-0 PS2 18 (SUTURE) IMPLANT
SUT VIC AB 0 CT1 36 (SUTURE) IMPLANT
SUT VIC AB 2-0 SH 27 (SUTURE) ×2
SUT VIC AB 2-0 SH 27XBRD (SUTURE) ×1 IMPLANT
SUT VIC AB 3-0 SH 27 (SUTURE)
SUT VIC AB 3-0 SH 27X BRD (SUTURE) IMPLANT
SUTURE EHLN 3-0 FS-10 30 BLK (SUTURE) IMPLANT
SYRINGE 10CC LL (SYRINGE) IMPLANT

## 2015-03-01 NOTE — Progress Notes (Signed)
[facility] Cardiology [facility] Encounter Note  Patient: [name] / Admit Date: 02/28/2015 / Date of Encounter: 03/01/2015, 8:09 AM   Subjective: Ankle pain but no evidence of the chest pain or congestive heart failure type symptoms  Review of Systems: Positive for: Limb pain Negative for: Vision change, hearing change, syncope, dizziness, nausea, vomiting,diarrhea, bloody stool, stomach pain, cough, congestion, diaphoresis, urinary frequency, urinary pain,skin lesions, skin rashes Others previously listed  Objective: Telemetry: Normal sinus rhythm Physical Exam: Blood pressure 146/79, pulse 100, temperature 98.2 F (36.8 C), temperature source Oral, resp. rate 14, height 5' (1.524 m), weight 180 lb (81.647 kg), last menstrual period 02/13/2015, SpO2 96 %. Body mass index is 35.15 kg/(m^2). General: Well developed, well nourished, in no acute distress. Head: Normocephalic, atraumatic, sclera non-icteric, no xanthomas, nares are without discharge. Neck: No apparent masses Lungs: Normal respirations with no wheezes, no rhonchi, no rales , no crackles   Heart: Regular rate and rhythm, normal S1 S2, no murmur, no rub, no gallop, PMI is normal size and placement, carotid upstroke normal without bruit, jugular venous pressure normal Abdomen: Soft, non-tender, non-distended with normoactive bowel sounds. No hepatosplenomegaly. Abdominal aorta is normal size without bruit Extremities: No edema, no clubbing, no cyanosis, no ulcers,  Peripheral: 2+ radial, 2+ femoral, 2+ dorsal pedal pulses Neuro: Alert and oriented. Moves all extremities spontaneously. Psych:  Responds to questions appropriately with a normal affect.   Intake/Output Summary (Last 24 hours) at 03/01/15 0809 Last data filed at 03/01/15 0736  Gross per 24 hour  Intake 1513.75 ml  Output      0 ml  Net 1513.75 ml    Inpatient Medications:  . amLODipine  5 mg Oral Daily  . aspirin EC  81 mg Oral Daily  . atorvastatin   80 mg Oral Daily  . benazepril  20 mg Oral Daily  . clopidogrel  75 mg Oral Daily  . hydrochlorothiazide  25 mg Oral Daily  . metoprolol tartrate  12.5 mg Oral BID  . nicotine  14 mg Transdermal Daily  . traZODone  50 mg Oral QHS   Infusions:  . sodium chloride 75 mL/hr at 03/01/15 0044    Labs: No results for input(s): NA, K, CL, CO2, GLUCOSE, BUN, CREATININE, CALCIUM, MG, PHOS in the last 72 hours. No results for input(s): AST, ALT, ALKPHOS, BILITOT, PROT, ALBUMIN in the last 72 hours. No results for input(s): WBC, NEUTROABS, HGB, HCT, MCV, PLT in the last 72 hours. No results for input(s): CKTOTAL, CKMB, TROPONINI in the last 72 hours. Invalid input(s): POCBNP No results for input(s): HGBA1C in the last 72 hours.   Weights: Filed Weights   02/28/15 9147  Weight: 180 lb (81.647 kg)     Radiology/Studies:  Dg Ankle Complete Right  02/28/2015   CLINICAL DATA:  Fall from porch with right ankle pain and swelling.  EXAM: RIGHT ANKLE - COMPLETE 3+ VIEW  COMPARISON:  None.  FINDINGS: There is an oblique right lateral malleolus intra-articular fracture with 5 mm lateral displacement of the distal fracture fragment. There is a transverse intra-articular medial malleolus fracture with 5 mm lateral displacement of the distal fracture fragment. There is a likely nondisplaced posterior malleolar fracture in the posterior distal right tibial epiphysis. There is 5 mm lateral subluxation of the talus relative to the tibial plafond. There is prominent soft tissue swelling throughout the right ankle.  IMPRESSION: 1. Trimalleolar right ankle fractures. 2. Mild lateral subluxation of the talus in the right ankle  as described.   Electronically Signed   By: Delbert Phenix M.D.   On: 02/28/2015 09:15     Assessment and Recommendation  34 y.o. female with known coronary artery disease status post PCI and stent placement earlier this year without evidence of congestive heart failure or anginal equivalent  having ankle fracture needing further surgical intervention at lowest risk possible for surgical intervention due to no evidence of myocardial infarction heart failure or angina on appropriate medication management  1. Continue metoprolol hydrochlorothiazide and benazepril with amlodipine for hypertension control 2. High intensity cholesterol therapy with atorvastatin 3. Aspirin for further risk reduction in in-stent thrombosis and abstain from Plavix at this time to reduce bleeding complications with surgical intervention until safe period post surgery to reinstate 4. No restrictions to surgery and/or rehabilitation 5. No further cardiac intervention and or diagnostic testing at this time necessary  Signed, Arnoldo Hooker M.D. FACC

## 2015-03-01 NOTE — Anesthesia Procedure Notes (Signed)
Procedure Name: LMA Insertion Performed by: Juliane Guest Pre-anesthesia Checklist: Patient identified, Patient being monitored, Timeout performed, Emergency Drugs available and Suction available Patient Re-evaluated:Patient Re-evaluated prior to inductionOxygen Delivery Method: Circle system utilized Preoxygenation: Pre-oxygenation with 100% oxygen Intubation Type: IV induction Ventilation: Mask ventilation without difficulty LMA: LMA inserted LMA Size: 3.0 Tube type: Oral Number of attempts: 1 Placement Confirmation: positive ETCO2 and breath sounds checked- equal and bilateral Tube secured with: Tape Dental Injury: Teeth and Oropharynx as per pre-operative assessment      

## 2015-03-01 NOTE — Progress Notes (Signed)
Patient still complain of right ankle pain- pain of 7 on pain scale of 0-10, post given oxycodone 5 mg at 1656, Dr.Menz paged, spoke with MD - received order for morphine 2 mg iv x 1 now. Will continue to monitor the patient.

## 2015-03-01 NOTE — Transfer of Care (Signed)
Immediate Anesthesia Transfer of Care Note  Patient: [name]  Procedure(s) Performed: Procedure(s): OPEN REDUCTION INTERNAL FIXATION (ORIF) ANKLE FRACTURE (Right)  Patient Location: PACU  Anesthesia Type:General  Level of Consciousness: awake  Airway & Oxygen Therapy: Patient Spontanous Breathing and Patient connected to face mask oxygen  Post-op Assessment: Report given to RN  Post vital signs: Reviewed  Last Vitals:  Filed Vitals:   03/01/15 1223  BP: 176/110  Pulse: 105  Temp: 37.1 C  Resp: 16    Complications: No apparent anesthesia complications

## 2015-03-01 NOTE — Op Note (Signed)
02/28/2015 - 03/01/2015  12:28 PM  PATIENT:  [name]  34 y.o. female  PRE-OPERATIVE DIAGNOSIS:  ankle fracture, trimalleolar  POST-OPERATIVE DIAGNOSIS:  trimalleolar fracture right ankle  PROCEDURE:  Procedure(s): OPEN REDUCTION INTERNAL FIXATION (ORIF) ANKLE FRACTURE (Right)  SURGEON: Leitha Schuller, MD  ASSISTANTS: None  ANESTHESIA:   general  EBL:  Total I/O In: 770 [I.V.:770] Out: 500 [Urine:500]  BLOOD ADMINISTERED:none  DRAINS: none   LOCAL MEDICATIONS USED:  NONE  SPECIMEN:  No Specimen  DISPOSITION OF SPECIMEN:  N/A  COUNTS:  YES  TOURNIQUET:   Total Tourniquet Time Documented: Thigh (Right) - 40 minutes Total: Thigh (Right) - 40 minutes   IMPLANTS: Biomet small frag set one third tubular plate 5 hole with 3 cortical 2 cancellus screws, 4.0 cannulated screw 36 mm medially  DICTATION: .Dragon Dictation patient brought the operating room and after adequate general anesthesia was obtained the right leg was prepped and draped in sterile fashion with a bump underneath the right hip to internally rotate the leg. After patient education and timeout procedure and after prepping and draping tourniquet was raised to 300 mmHg. Distal incision was made over the distal fibula and the fracture exposed and reduced a 5 hole third tibial plate was contoured to fit and fixed to the distal fibula 3 cortical and 2 cancellous screws distally this gave essentially anatomic alignment of the fibula and reduced the medial malleolus. Going medially and anterior medial approach was made and the fracture was oblique with the fractured facing posterior and a 45 angle the fracture site was opened and the joint irrigated the fracture was reduced anatomically and held in place with a K wire and a cannulated 4.0 screws placed over this after drilling 36 mm length there is good compression at the fracture site and the C-arm view showed essentially anatomic alignment of all 3 fragments posterior  malleolus is fat from small and did not need to be fixed. The wounds were irrigated and then closed with 2-0 Vicryl subcutaneously and skin staples. Xeroform 4 x 4 web roll and stirrup splint were applied followed by an Ace wrap and tourniquet was then let down.  PLAN OF CARE: Continue inpatient status  PATIENT DISPOSITION:  PACU - hemodynamically stable.

## 2015-03-01 NOTE — Anesthesia Preprocedure Evaluation (Signed)
Anesthesia Evaluation  Patient identified by MRN, date of birth, ID band Patient awake    Reviewed: Allergy & Precautions, H&P , NPO status , Patient's Chart, lab work & pertinent test results  History of Anesthesia Complications Negative for: history of anesthetic complications  Airway Mallampati: II  TM Distance: >3 FB Neck ROM: full    Dental  (+) Poor Dentition, Chipped, Caps   Pulmonary neg shortness of breath, Current Smoker,    Pulmonary exam normal breath sounds clear to auscultation       Cardiovascular Exercise Tolerance: Good hypertension, (-) angina+ CAD, + Past MI and + Cardiac Stents  (-) CHF and (-) DOE Normal cardiovascular exam Rhythm:regular Rate:Normal     Neuro/Psych negative neurological ROS  negative psych ROS   GI/Hepatic negative GI ROS, Neg liver ROS, neg GERD  ,  Endo/Other  negative endocrine ROS  Renal/GU negative Renal ROS  negative genitourinary   Musculoskeletal   Abdominal   Peds  Hematology negative hematology ROS (+)   Anesthesia Other Findings Past Medical History:   Hypertension                                                 Myocardial infarction (HCC)                                    Comment:2014   Hyperlipidemia                                               Signs and symptoms suggestive of sleep apnea   Reproductive/Obstetrics negative OB ROS                             Anesthesia Physical Anesthesia Plan  ASA: III  Anesthesia Plan: General LMA   Post-op Pain Management:    Induction:   Airway Management Planned:   Additional Equipment:   Intra-op Plan:   Post-operative Plan:   Informed Consent: I have reviewed the patients History and Physical, chart, labs and discussed the procedure including the risks, benefits and alternatives for the proposed anesthesia with the patient or authorized representative who has indicated his/her  understanding and acceptance.   Dental Advisory Given  Plan Discussed with: Anesthesiologist, CRNA and Surgeon  Anesthesia Plan Comments: (Patient informed that they are higher risk for complications from anesthesia during this procedure due to their medical history.  Patient voiced understanding.)        Anesthesia Quick Evaluation

## 2015-03-01 NOTE — Progress Notes (Signed)
Spoke with OR nurse Lupita Leash- update with urine specimen sent to lab.

## 2015-03-01 NOTE — Progress Notes (Signed)
OR tech here to take patient to surgery, consents on chart, abx sent on chart, urine specimen sent to lab prior to patient leaving floor.

## 2015-03-01 NOTE — Anesthesia Postprocedure Evaluation (Signed)
  Anesthesia Post-op Note  Patient: [name]  Procedure(s) Performed: Procedure(s): OPEN REDUCTION INTERNAL FIXATION (ORIF) ANKLE FRACTURE (Right)  Anesthesia type:General LMA  Patient location: PACU  Post pain: Pain level controlled  Post assessment: Post-op Vital signs reviewed, Patient's Cardiovascular Status Stable, Respiratory Function Stable, Patent Airway and No signs of Nausea or vomiting  Post vital signs: Reviewed and stable  Last Vitals:  Filed Vitals:   03/01/15 1350  BP: 146/83  Pulse: 104  Temp: 36.9 C  Resp: 14    Level of consciousness: awake, alert  and patient cooperative  Complications: No apparent anesthesia complications

## 2015-03-01 NOTE — Progress Notes (Signed)
Plan of care discussed with patient. Plan of care placed on board. Pain controlled with PRN medication. Patient resting in between care. IV fluids infusing.Splint CDI. Call bell within reach.

## 2015-03-02 ENCOUNTER — Encounter: Payer: Self-pay | Admitting: Orthopedic Surgery

## 2015-03-02 MED ORDER — ASPIRIN EC 325 MG PO TBEC: 325.0000 mg | DELAYED_RELEASE_TABLET | Freq: Every day | ORAL | Status: DC

## 2015-03-02 MED ORDER — OXYCODONE HCL 5 MG PO TABS: 5.0000 mg | ORAL_TABLET | ORAL | Status: DC | PRN

## 2015-03-02 NOTE — Progress Notes (Signed)
Patient wanting to know when she will be discharge, spoke with Dr.Menz, MD to come see patient/ discharge later today. Patient informed MD will be here later today.

## 2015-03-02 NOTE — Evaluation (Signed)
Physical Therapy Evaluation Patient Details Name: [name] MRN: [id number] DOB: November 06, 1980 Today's Date: 03/02/2015   History of Present Illness  Pt is 34 y.o. female s/p R trimaleolar fx ORIF repair.   Clinical Impression  Pt denies and c/o chest pain, SOB, HA, dizzy/lightheadedness. Pt was able to demonstrate independence with bed mobility and transfers. Pt was mod independent with ambulation x 200 ft w/ RW. Pt able to maintain TTWB status of RLE independently and demonstrated good compensation using the strength of her BUEs on the RW. Pt has 2 stairs to enter her home with rails on both sides that she is able to reach simultaneously. Stairs attempted during eval x4 ascending and descending with hop-to pattern assisted by her BUEs on 2 rails and LLE providing push-off. Pt displayed safety and proficiency with this technique and does not indicate that she is at a risk for falls using this method. Pt will continue to benefit from skilled acute PT services in order to focus on higher level balance and strengthening activities as well as to continue practicing stair navigation in order to facilitate a safe return home.     Follow Up Recommendations Outpatient PT    Equipment Recommendations  Rolling walker with 5" wheels    Recommendations for Other Services       Precautions / Restrictions Precautions Precautions: Fall Required Braces or Orthoses: Other Brace/Splint Other Brace/Splint: R ankle brace/splint and wrapped Restrictions RLE Weight Bearing: Touchdown weight bearing      Mobility  Bed Mobility Overal bed mobility: Independent                Transfers Overall transfer level: Independent Equipment used: Rolling walker (2 wheeled)                Ambulation/Gait Ambulation/Gait assistance: Modified independent (Device/Increase time) Ambulation Distance (Feet): 200 Feet Assistive device: Rolling walker (2 wheeled)   Gait velocity: Decreased   General  Gait Details: hop- to gait pattern with mainatence of TTWB on RLE. Demonstrates good upper body strength,   Stairs Stairs: Yes Stairs assistance: Min guard Stair Management: Two rails (hop-to pattern with BUE used to boost LLE up onto the step with a slight jump off of LLE) Number of Stairs: 4 General stair comments: Pt navigated stairs well and maintained TTWB through the use of her BUE on both rails and hopping with her LLE.   Wheelchair Mobility    Modified Rankin (Stroke Patients Only)       Balance Overall balance assessment: Modified Independent (pt maintains good standing/dynamic balance with BUE assist on RW)                                           Pertinent Vitals/Pain Pain Assessment: 0-10 Pain Score: 4  Pain Location: R ankle Pain Intervention(s): Monitored during session    Home Living Family/patient expects to be discharged to:: Private residence Living Arrangements: Spouse/significant other;Children Available Help at Discharge: Family Type of Home: House Home Access: Stairs to enter Entrance Stairs-Rails: Can reach both Entrance Stairs-Number of Steps: 2 Home Layout: One level Home Equipment: None      Prior Function Level of Independence: Independent               Hand Dominance        Extremity/Trunk Assessment   Upper Extremity Assessment: Overall WFL for tasks assessed  Lower Extremity Assessment: Overall WFL for tasks assessed (gross LE strength at least 4/5 bilat)         Communication   Communication: No difficulties  Cognition Arousal/Alertness: Awake/alert Behavior During Therapy: WFL for tasks assessed/performed Overall Cognitive Status: Within Functional Limits for tasks assessed                      General Comments      Exercises Total Joint Exercises Gluteal Sets: Strengthening;Both;15 reps;Supine Short Arc Quad: Strengthening;15 reps;Supine;Right Hip ABduction/ADduction:  AROM;Strengthening;Right;10 reps;Supine Straight Leg Raises: Strengthening;Right;15 reps;Supine Knee Flexion: AROM;10 reps;Right;Supine;Strengthening      Assessment/Plan    PT Assessment Patient needs continued PT services  PT Diagnosis Acute pain;Abnormality of gait   PT Problem List Decreased mobility;Decreased skin integrity;Decreased range of motion;Decreased strength;Decreased activity tolerance;Decreased balance  PT Treatment Interventions DME instruction;Stair training;Gait training;Functional mobility training;Patient/family education   PT Goals (Current goals can be found in the Care Plan section) Acute Rehab PT Goals Patient Stated Goal: to go home PT Goal Formulation: With patient Time For Goal Achievement: 03/16/15 Potential to Achieve Goals: Good    Frequency BID   Barriers to discharge        Co-evaluation               End of Session Equipment Utilized During Treatment: Gait belt Activity Tolerance: Patient tolerated treatment well Patient left: in bed;with call bell/phone within reach;with bed alarm set;with family/visitor present           Time: 0981-1914 PT Time Calculation (min) (ACUTE ONLY): 28 min   Charges:         PT G Codes:        Kristain Hu,SPT 03/02/2015, 10:35 AM

## 2015-03-02 NOTE — Progress Notes (Signed)
Order from Dr.Menz to discharge patient to home today with walker. Discharge instructions reviewed with patient per MD order. Home and new medications reviewed with patient, Rx.slip for oxycodone given.  Daughter to take patient home.  Discharge via wheelchair with B- nurse tech.

## 2015-03-02 NOTE — Discharge Instructions (Signed)
Diet: As you were doing prior to hospitalization   Shower:  Keep cast clean and dry. Do not get wet.  Activity:  Increase activity slowly as tolerated, but follow the weight bearing instructions below.  No lifting or driving for 6 weeks.  Weight Bearing:   Toe touch weight bearing to right lower extremity. Use crutches  To prevent constipation: you may use a stool softener such as -  Colace (over the counter) 100 mg by mouth twice a day  Drink plenty of fluids (prune juice may be helpful) and high fiber foods Miralax (over the counter) for constipation as needed.    Itching:  If you experience itching with your medications, try taking only a single pain pill, or even half a pain pill at a time.  You may take up to 10 pain pills per day, and you can also use benadryl over the counter for itching or also to help with sleep.   Precautions:  If you experience chest pain or shortness of breath - call 911 immediately for transfer to the hospital emergency department!!  If you develop a fever greater that 101 F, purulent drainage from wound, increased redness or drainage from wound, or calf pain-Call [facility]                                              Follow- Up Appointment:  Please call for an appointment to be seen in 2 weeks at [facility] or [facility] Casts and splints support injured limbs and keep bones from moving while they heal. It is important to care for your cast or splint at home.  HOME CARE INSTRUCTIONS  Keep the cast or splint uncovered during the drying period. It can take 24 to 48 hours to dry if it is made of plaster. A fiberglass cast will dry in less than 1 hour.  Do not rest the cast on anything harder than a pillow for the first 24 hours.  Do not put weight on your injured limb or apply pressure to the cast until your health care provider gives you permission.  Keep the cast or splint dry. Wet casts or splints can lose their shape and  may not support the limb as well. A wet cast that has lost its shape can also create harmful pressure on your skin when it dries. Also, wet skin can become infected.  Cover the cast or splint with a plastic bag when bathing or when out in the rain or snow. If the cast is on the trunk of the body, take sponge baths until the cast is removed.  If your cast does become wet, dry it with a towel or a blow dryer on the cool setting only.  Keep your cast or splint clean. Soiled casts may be wiped with a moistened cloth.  Do not place any hard or soft foreign objects under your cast or splint, such as cotton, toilet paper, lotion, or powder.  Do not try to scratch the skin under the cast with any object. The object could get stuck inside the cast. Also, scratching could lead to an infection. If itching is a problem, use a blow dryer on a cool setting to relieve discomfort.  Do not trim or cut your cast or remove padding from inside of it.  Exercise all joints next to the  injury that are not immobilized by the cast or splint. For example, if you have a long leg cast, exercise the hip joint and toes. If you have an arm cast or splint, exercise the shoulder, elbow, thumb, and fingers.  Elevate your injured arm or leg on 1 or 2 pillows for the first 1 to 3 days to decrease swelling and pain.It is best if you can comfortably elevate your cast so it is higher than your heart. SEEK MEDICAL CARE IF:   Your cast or splint cracks.  Your cast or splint is too tight or too loose.  You have unbearable itching inside the cast.  Your cast becomes wet or develops a soft spot or area.  You have a bad smell coming from inside your cast.  You get an object stuck under your cast.  Your skin around the cast becomes red or raw.  You have new pain or worsening pain after the cast has been applied. SEEK IMMEDIATE MEDICAL CARE IF:   You have fluid leaking through the cast.  You are unable to move your fingers  or toes.  You have discolored (blue or white), cool, painful, or very swollen fingers or toes beyond the cast.  You have tingling or numbness around the injured area.  You have severe pain or pressure under the cast.  You have any difficulty with your breathing or have shortness of breath.  You have chest pain.   This information is not intended to replace advice given to you by your health care provider. Make sure you discuss any questions you have with your health care provider.   Document Released: 05/11/2000 Document Revised: 03/04/2013 Document Reviewed: 11/20/2012 Elsevier Interactive Patient Education [employer]

## 2015-03-02 NOTE — Discharge Summary (Signed)
Physician Discharge Summary  Patient ID: [name] MRN: [id number] DOB/AGE: 36-Jul-1982 33 y.o.  Admit date: 02/28/2015 Discharge date: 03/02/2015  Admission Diagnoses:  Talar fracture, right, closed, initial encounter [S92.101A] Trimalleolar fracture of ankle, closed, right, initial encounter [S82.851A]   Discharge Diagnoses: Patient Active Problem List   Diagnosis Date Noted  . Trimalleolar fracture of ankle, closed 02/28/2015  . NSTEMI (non-ST elevated myocardial infarction) (HCC) 10/14/2014    Past Medical History  Diagnosis Date  . Hypertension   . Myocardial infarction (HCC)     2014  . Hyperlipidemia      Transfusion: none   Consultants (if any): Treatment Team:  Lamar Blinks, MD  Discharged Condition: Improved  Hospital Course: [name] is an 34 y.o. female who was admitted 02/28/2015 with a diagnosis of right trimalleolar fracture and went to the operating room on 02/28/2015 - 03/01/2015 and underwent the above named procedures.    Surgeries: Procedure(s): OPEN REDUCTION INTERNAL FIXATION (ORIF) ANKLE FRACTURE on 02/28/2015 - 03/01/2015 Patient tolerated the surgery well. Taken to PACU where she was stabilized and then transferred to the orthopedic floor.  Started on aspirin daily. Heels elevated on bed with rolled towels. No evidence of DVT. Negative Homan. Physical therapy started on day #1 for gait training and transfer.   Patient's foley was d/c on day #1. Patient Was stable and ready for discharge to home.  Patient was placed into a cast before discharge.  Implants: Biomet small frag set one third tubular plate 5 hole with 3 cortical 2 cancellus screws, 4.0 cannulated screw 36 mm medially   She was given perioperative antibiotics:  Anti-infectives    Start     Dose/Rate Route Frequency Ordered Stop   03/01/15 1415  ceFAZolin (ANCEF) IVPB 2 g/50 mL premix     2 g 100 mL/hr over 30 Minutes Intravenous Every 6 hours 03/01/15 1414 03/02/15 0145    02/28/15 1030  ceFAZolin (ANCEF) IVPB 2 g/50 mL premix  Status:  Discontinued     2 g 100 mL/hr over 30 Minutes Intravenous On call to O.R. 02/28/15 1023 03/01/15 1414    .  She was given sequential compression devices, early ambulation, and aspirin for DVT prophylaxis.  She benefited maximally from the hospital stay and there were no complications.    Recent vital signs:  Filed Vitals:   03/02/15 0828  BP: 141/86  Pulse: 87  Temp: 98.1 F (36.7 C)  Resp: 16    Recent laboratory studies:  Lab Results  Component Value Date   HGB 14.3 10/16/2014   HGB 13.2 10/14/2014   HGB 15.5 01/07/2014   Lab Results  Component Value Date   WBC 9.5 10/16/2014   PLT 238 10/16/2014   Lab Results  Component Value Date   INR 1.0 06/21/2013   Lab Results  Component Value Date   NA 137 10/16/2014   K 3.9 10/16/2014   CL 103 10/16/2014   CO2 27 10/16/2014   BUN 18 10/16/2014   CREATININE 0.78 10/16/2014   GLUCOSE 106* 10/16/2014    Discharge Medications:     Medication List    TAKE these medications        amLODipine 5 MG tablet  Commonly known as:  NORVASC  Take 1 tablet (5 mg total) by mouth daily.     aspirin EC 81 MG tablet  Take 1 tablet (81 mg total) by mouth daily.     aspirin EC 325 MG tablet  Take 1  tablet (325 mg total) by mouth daily.     atorvastatin 80 MG tablet  Commonly known as:  LIPITOR  Take 1 tablet (80 mg total) by mouth daily.     benazepril 20 MG tablet  Commonly known as:  LOTENSIN  Take 1 tablet (20 mg total) by mouth daily.     clopidogrel 75 MG tablet  Commonly known as:  PLAVIX  Take 1 tablet (75 mg total) by mouth daily.     hydrochlorothiazide 25 MG tablet  Commonly known as:  HYDRODIURIL  Take 1 tablet (25 mg total) by mouth daily.     metoprolol tartrate 25 MG tablet  Commonly known as:  LOPRESSOR  Take 0.5 tablets (12.5 mg total) by mouth 2 (two) times daily.     nitroGLYCERIN 0.4 MG SL tablet  Commonly known as:  NITROSTAT   Place 1 tablet (0.4 mg total) under the tongue every 5 (five) minutes as needed.     oxyCODONE 5 MG immediate release tablet  Commonly known as:  Oxy IR/ROXICODONE  Take 1-2 tablets (5-10 mg total) by mouth every 3 (three) hours as needed for breakthrough pain.     traZODone 50 MG tablet  Commonly known as:  DESYREL  Take 1 tablet (50 mg total) by mouth at bedtime as needed.        Diagnostic Studies: Dg Ankle 2 Views Right  03-18-2015   CLINICAL DATA:  Postop internal fixation  EXAM: RIGHT ANKLE - 2 VIEW  COMPARISON:  02/28/2015  FINDINGS: Screw fixation across the medial malleolar fracture with near anatomic alignment. Plate and screw fixation in the distal fibula with near anatomic alignment. Slight displacement of a posterior malleolar fracture. No subluxation or dislocation.  IMPRESSION: Internal fixation with near anatomic alignment.   Electronically Signed   By: Charlett Nose M.D.   On: March 18, 2015 13:01   Dg Ankle Complete Right  02/28/2015   CLINICAL DATA:  Fall from porch with right ankle pain and swelling.  EXAM: RIGHT ANKLE - COMPLETE 3+ VIEW  COMPARISON:  None.  FINDINGS: There is an oblique right lateral malleolus intra-articular fracture with 5 mm lateral displacement of the distal fracture fragment. There is a transverse intra-articular medial malleolus fracture with 5 mm lateral displacement of the distal fracture fragment. There is a likely nondisplaced posterior malleolar fracture in the posterior distal right tibial epiphysis. There is 5 mm lateral subluxation of the talus relative to the tibial plafond. There is prominent soft tissue swelling throughout the right ankle.  IMPRESSION: 1. Trimalleolar right ankle fractures. 2. Mild lateral subluxation of the talus in the right ankle as described.   Electronically Signed   By: Delbert Phenix M.D.   On: 02/28/2015 09:15    Disposition: 01-Home or Self Care        Follow-up Information    Follow up with MENZ,MICHAEL, MD In 12  days.   Specialty:  Orthopedic Surgery   Why:  For staple removal and skin check   Contact information:   [address] [facility] [place] [address] [phone]        Signed: Patience Musca 03/02/2015, 2:31 PM

## 2015-03-02 NOTE — Progress Notes (Signed)
Clinical Child psychotherapist (CSW) received SNF consult. PT is recommending home. RN Case Manager aware of above. Please reconsult if future social work needs arise. CSW signing off.   Jetta Lout, LCSWA [phone]

## 2015-03-02 NOTE — Progress Notes (Signed)
   Subjective: 1 Day Post-Op Procedure(s) (LRB): OPEN REDUCTION INTERNAL FIXATION (ORIF) ANKLE FRACTURE (Right) Patient reports pain as 6 on 0-10 scale.   Patient is well, and has had no acute complaints or problems We will start therapy today.  Plan is to go Home after hospital stay.  Objective: Vital signs in last 24 hours: Temp:  [97.8 F (36.6 C)-99.7 F (37.6 C)] 98.5 F (36.9 C) (10/05 0512) Pulse Rate:  [95-117] 107 (10/05 0512) Resp:  [14-26] 18 (10/05 0512) BP: (134-191)/(68-117) 134/68 mmHg (10/05 0512) SpO2:  [93 %-100 %] 97 % (10/05 0512) FiO2 (%):  [21 %-28 %] 28 % (10/04 1415)  Intake/Output from previous day: 10/04 0701 - 10/05 0700 In: 1427 [I.V.:1427] Out: 1160 [Urine:1160] Intake/Output this shift:    No results for input(s): HGB in the last 72 hours. No results for input(s): WBC, RBC, HCT, PLT in the last 72 hours. No results for input(s): NA, K, CL, CO2, BUN, CREATININE, GLUCOSE, CALCIUM in the last 72 hours. No results for input(s): LABPT, INR in the last 72 hours.  EXAM General - Patient is Alert, Appropriate and Oriented Extremity - Neurovascular intact Sensation intact distally Intact pulses distally splint intact Dressing - dressing C/D/I and no drainage Motor Function - intact, moving toes well on exam.   Past Medical History  Diagnosis Date  . Hypertension   . Myocardial infarction (HCC)     2014  . Hyperlipidemia     Assessment/Plan:   1 Day Post-Op Procedure(s) (LRB): OPEN REDUCTION INTERNAL FIXATION (ORIF) ANKLE FRACTURE (Right) Active Problems:   Trimalleolar fracture of ankle, closed  Estimated body mass index is 35.15 kg/(m^2) as calculated from the following:   Height as of this encounter: 5' (1.524 m).   Weight as of this encounter: 81.647 kg (180 lb). Advance diet Up with therapy, toe touch weight bearing to RLE   DVT Prophylaxis - Aspirin Toe touch weight-Bearing to right leg D/C O2 and Pulse OX and try on Room  Air  T. Cranston Neighbor, PA-C [facility] 03/02/2015, 7:53 AM

## 2015-03-02 NOTE — Care Management (Signed)
Met with patient to discuss discharge home today. PT is not recommending anything at this time. Patient "did well and worked with stairs without difficulty" and patient agrees. She will need a rolling walker. I have requested this from Will with [facility]. Patient states she is not familiar with [facility] outpatient PT so patient is not followed at [facility]. Patient advised to follow up with Dr. Menz as he states she will be non-weight bearing for a while- she agrees. No further RNCM needs. Case closed.  

## 2015-08-10 ENCOUNTER — Emergency Department: Payer: Self-pay

## 2015-08-10 ENCOUNTER — Encounter: Payer: Self-pay | Admitting: Emergency Medicine

## 2015-08-10 ENCOUNTER — Emergency Department
Admission: EM | Admit: 2015-08-10 | Discharge: 2015-08-10 | Disposition: A | Discharge status: Home / Self Care | Payer: Self-pay | Attending: Emergency Medicine | Admitting: Emergency Medicine

## 2015-08-10 DIAGNOSIS — Z7982 Long term (current) use of aspirin: Secondary | ICD-10-CM | POA: Insufficient documentation

## 2015-08-10 DIAGNOSIS — R52 Pain, unspecified: Secondary | ICD-10-CM

## 2015-08-10 DIAGNOSIS — I252 Old myocardial infarction: Secondary | ICD-10-CM | POA: Insufficient documentation

## 2015-08-10 DIAGNOSIS — I10 Essential (primary) hypertension: Secondary | ICD-10-CM | POA: Insufficient documentation

## 2015-08-10 DIAGNOSIS — R1110 Vomiting, unspecified: Secondary | ICD-10-CM | POA: Insufficient documentation

## 2015-08-10 DIAGNOSIS — B349 Viral infection, unspecified: Secondary | ICD-10-CM

## 2015-08-10 DIAGNOSIS — F17210 Nicotine dependence, cigarettes, uncomplicated: Secondary | ICD-10-CM | POA: Insufficient documentation

## 2015-08-10 DIAGNOSIS — Z79899 Other long term (current) drug therapy: Secondary | ICD-10-CM | POA: Insufficient documentation

## 2015-08-10 DIAGNOSIS — E785 Hyperlipidemia, unspecified: Secondary | ICD-10-CM | POA: Insufficient documentation

## 2015-08-10 DIAGNOSIS — J040 Acute laryngitis: Secondary | ICD-10-CM

## 2015-08-10 DIAGNOSIS — R197 Diarrhea, unspecified: Secondary | ICD-10-CM | POA: Insufficient documentation

## 2015-08-10 DIAGNOSIS — J069 Acute upper respiratory infection, unspecified: Secondary | ICD-10-CM

## 2015-08-10 LAB — CBC
HEMATOCRIT: 40.2 % (ref 35.0–47.0)
HEMOGLOBIN: 13.8 g/dL (ref 12.0–16.0)
MCH: 29.5 pg (ref 26.0–34.0)
MCHC: 34.2 g/dL (ref 32.0–36.0)
MCV: 86.1 fL (ref 80.0–100.0)
Platelets: 222 10*3/uL (ref 150–440)
RBC: 4.67 MIL/uL (ref 3.80–5.20)
RDW: 13.2 % (ref 11.5–14.5)
WBC: 6.2 10*3/uL (ref 3.6–11.0)

## 2015-08-10 LAB — RAPID INFLUENZA A&B ANTIGENS: Influenza A (ARMC): NEGATIVE

## 2015-08-10 LAB — TROPONIN I

## 2015-08-10 LAB — BASIC METABOLIC PANEL
ANION GAP: 6 (ref 5–15)
BUN: 11 mg/dL (ref 6–20)
CALCIUM: 8.6 mg/dL — AB (ref 8.9–10.3)
CO2: 21 mmol/L — AB (ref 22–32)
Chloride: 106 mmol/L (ref 101–111)
Creatinine, Ser: 0.69 mg/dL (ref 0.44–1.00)
Glucose, Bld: 124 mg/dL — ABNORMAL HIGH (ref 65–99)
POTASSIUM: 3.5 mmol/L (ref 3.5–5.1)
Sodium: 133 mmol/L — ABNORMAL LOW (ref 135–145)

## 2015-08-10 LAB — RAPID INFLUENZA A&B ANTIGENS (ARMC ONLY): INFLUENZA B (ARMC): NEGATIVE

## 2015-08-10 LAB — FIBRIN DERIVATIVES D-DIMER (ARMC ONLY): FIBRIN DERIVATIVES D-DIMER (ARMC): 410 (ref 0–499)

## 2015-08-10 MED ORDER — ONDANSETRON 4 MG PO TBDP: 4.0000 mg | ORAL_TABLET | Freq: Three times a day (TID) | ORAL | Status: DC | PRN

## 2015-08-10 MED ORDER — KETOROLAC TROMETHAMINE 60 MG/2ML IM SOLN
60.0000 mg | Freq: Once | INTRAMUSCULAR | Status: AC
  Administered 2015-08-10: 60 mg via INTRAMUSCULAR
  Filled 2015-08-10: qty 2

## 2015-08-10 NOTE — ED Provider Notes (Signed)
[facility] Emergency Department Provider Note  ____________________________________________  Time seen: Approximately 538 AM  I have reviewed the triage vital signs and the nursing notes.   HISTORY  Chief Complaint Back Pain; Laryngitis; and Chest Pain    HPI [name] is a 35 y.o. female who comes into the hospital today with diarrhea and vomiting. The patient reports that the symptoms started a week ago. She reports that she seems to be getting worse. She also reports that her chest and her back hurts when she breathes. She lost her voice 3 days ago and has some congestion with an occasional cough and some abdominal pain. The patient reports that currently the pain is improved as well as the vomiting and diarrhea but she was concerned because she continued to have the symptoms. She's been fatigued with some decreased appetite. The patient has had some shortness of breath and sweats. She came in tonight because of the sweats as well as the chest pain in the back pain. The patient reports that she has had no fevers that she knows of and denies any sick contacts. The patient has not been taking any specific medicine at home for her symptoms. She is also having some body aches.The patient rates her pain a 6 out of 10 in intensity.    Past Medical History  Diagnosis Date  . Hypertension   . Myocardial infarction (HCC)     2014  . Hyperlipidemia     Patient Active Problem List   Diagnosis Date Noted  . Trimalleolar fracture of ankle, closed 02/28/2015  . NSTEMI (non-ST elevated myocardial infarction) (HCC) 10/14/2014    Past Surgical History  Procedure Laterality Date  . Cardiac surgery  Cardiac stent to right coronary artery  . Tubal ligation  2004  . Cardiac catheterization N/A 10/15/2014    Procedure: Left Heart Cath;  Surgeon: Dalia Heading, MD;  Location: [facility] INVASIVE CV LAB;  Service: Cardiovascular;  Laterality: N/A;  . Orif ankle fracture Right  03/01/2015    Procedure: OPEN REDUCTION INTERNAL FIXATION (ORIF) ANKLE FRACTURE;  Surgeon: Kennedy Bucker, MD;  Location: [facility] ORS;  Service: Orthopedics;  Laterality: Right;  . Stent placement vascular ([facility] hx)      Current Outpatient Rx  Name  Route  Sig  Dispense  Refill  . amLODipine (NORVASC) 5 MG tablet   Oral   Take 1 tablet (5 mg total) by mouth daily.   30 tablet   1   . aspirin EC 325 MG tablet   Oral   Take 1 tablet (325 mg total) by mouth daily.   30 tablet   0   . aspirin EC 81 MG tablet   Oral   Take 1 tablet (81 mg total) by mouth daily.   30 tablet   1   . atorvastatin (LIPITOR) 80 MG tablet   Oral   Take 1 tablet (80 mg total) by mouth daily.   30 tablet   1   . benazepril (LOTENSIN) 20 MG tablet   Oral   Take 1 tablet (20 mg total) by mouth daily.   30 tablet   1   . clopidogrel (PLAVIX) 75 MG tablet   Oral   Take 1 tablet (75 mg total) by mouth daily.   30 tablet   1   . hydrochlorothiazide (HYDRODIURIL) 25 MG tablet   Oral   Take 1 tablet (25 mg total) by mouth daily.   30 tablet   1   .  metoprolol tartrate (LOPRESSOR) 25 MG tablet   Oral   Take 0.5 tablets (12.5 mg total) by mouth 2 (two) times daily.   30 tablet   1   . nitroGLYCERIN (NITROSTAT) 0.4 MG SL tablet   Sublingual   Place 1 tablet (0.4 mg total) under the tongue every 5 (five) minutes as needed.   30 tablet   1   . ondansetron (ZOFRAN ODT) 4 MG disintegrating tablet   Oral   Take 1 tablet (4 mg total) by mouth every 8 (eight) hours as needed for nausea or vomiting.   20 tablet   0   . oxyCODONE (OXY IR/ROXICODONE) 5 MG immediate release tablet   Oral   Take 1-2 tablets (5-10 mg total) by mouth every 3 (three) hours as needed for breakthrough pain.   60 tablet   0   . traZODone (DESYREL) 50 MG tablet   Oral   Take 1 tablet (50 mg total) by mouth at bedtime as needed.   30 tablet   1     Allergies Review of patient's allergies indicates no known  allergies.  Family History  Problem Relation Age of Onset  . Diabetes Mother   . Hypertension Mother   . Diabetes Father   . Hypertension Father   . Heart attack Sister     Social History Social History  Substance Use Topics  . Smoking status: Current Every Day Smoker -- 1.00 packs/day    Types: Cigarettes  . Smokeless tobacco: Never Used  . Alcohol Use: Yes    Review of Systems Constitutional: No fever/chills Eyes: No visual changes. ZOX:[id number]:Meningitis Cardiovascular: chest pain. Respiratory: Chest congestion and shortness of breath. Gastrointestinal: Abdominal pain, vomiting, diarrhea Genitourinary: Negative for dysuria. Musculoskeletal:  back pain and body aches Skin: Negative for rash  Neurological: Negative for headaches, focal weakness or numbness.  10-point ROS otherwise negative.  ____________________________________________   PHYSICAL EXAM:  VITAL SIGNS: ED Triage Vitals  Enc Vitals Group     BP 08/10/15 0331 161/102 mmHg     Pulse Rate 08/10/15 0331 103     Resp 08/10/15 0331 20     Temp 08/10/15 0331 98 F (36.7 C)     Temp Source 08/10/15 0331 Oral     SpO2 08/10/15 0331 98 %     Weight 08/10/15 0331 171 lb (77.565 kg)     Height 08/10/15 0331 5' (1.524 m)     Head Cir --      Peak Flow --      Pain Score 08/10/15 0332 6     Pain Loc --      Pain Edu? --      Excl. in GC? --     Constitutional: Alert and oriented. Well appearing and in Mild distress. Eyes: Conjunctivae are normal. PERRL. EOMI. Head: Atraumatic. Nose: No congestion/rhinnorhea. Mouth/Throat: Mucous membranes are moist.  Oropharynx non-erythematous. Cardiovascular: Normal rate, regular rhythm. Grossly normal heart sounds.  Good peripheral circulation. Respiratory: Normal respiratory effort.  No retractions. Lungs CTAB. Gastrointestinal: Soft and nontender. No distention. Positive bowel sounds Musculoskeletal: No lower extremity tenderness nor edema.  Neurologic:  Normal speech  and language.  Skin:  Skin is warm, dry and intact.  Psychiatric: Mood and affect are normal.   ____________________________________________   LABS (all labs ordered are listed, but only abnormal results are displayed)  Labs Reviewed  BASIC METABOLIC PANEL - Abnormal; Notable for the following:    Sodium 133 (*)    CO2 21 (*)  Glucose, Bld 124 (*)    Calcium 8.6 (*)    All other components within normal limits  RAPID INFLUENZA A&B ANTIGENS ([facility] ONLY)  CBC  TROPONIN I  FIBRIN DERIVATIVES D-DIMER ([facility] ONLY)   ____________________________________________  EKG  ED ECG REPORT I, Rebecka Apley, the attending physician, personally viewed and interpreted this ECG.   Date: 08/10/2015  EKG Time: 338  Rate: 100  Rhythm: normal sinus rhythm  Axis: Normal  Intervals:none  ST&T Change: None  ____________________________________________  RADIOLOGY  Chest x-ray: No evidence of active disease ____________________________________________   PROCEDURES  Procedure(s) performed: None  Critical Care performed: No  ____________________________________________   INITIAL IMPRESSION / ASSESSMENT AND PLAN / ED COURSE  Pertinent labs & imaging results that were available during my care of the patient were reviewed by me and considered in my medical decision making (see chart for details).  This is a 35 year old female who comes in with some vomiting and diarrhea as well as congestion and shortness of breath and some chest pain. I did order a d-dimer on the patient to be evaluated. The patient receive a dose of Toradol and we will determine if the patient needs a CT scan.  The patient's flu test is negative and her ddimer is negative as well. The patient reports that she feels improved. I have encouraged the patient to continue to symptomatically treat her symptoms and to follow up with her primary care physician.  ____________________________________________   FINAL  CLINICAL IMPRESSION(S) / ED DIAGNOSES  Final diagnoses:  Viral illness  Body aches  Upper respiratory infection  Laryngitis      Rebecka Apley, MD 08/10/15 0900

## 2015-08-10 NOTE — ED Notes (Signed)
Pt arrived to the ED accompanied by her daughter for complaints of back pain/upper chest pain during deep inhalation and loss of voice for the last 7 days. Pt is AOx4 in no apparent distress.

## 2015-08-10 NOTE — ED Notes (Signed)
Patient to ED for complaints of bilateral ear pain, laryngitis, back pain and body aches as well as N/V/D. Patient states her appetite is decreased but is table to keep fluids down. Denies fever at home. Using OTC Nyquil without much relief in symptoms. Denies CP or difficulty breathing but states she does feel congestion in her chest.

## 2015-08-10 NOTE — Discharge Instructions (Signed)
Laryngitis °Laryngitis is inflammation of your vocal cords. This causes hoarseness, coughing, loss of voice, sore throat, or a dry throat. Your vocal cords are two bands of muscles that are found in your throat. When you speak, these cords come together and vibrate. These vibrations come out through your mouth as sound. When your vocal cords are inflamed, your voice sounds different. °Laryngitis can be temporary (acute) or long-term (chronic). Most cases of acute laryngitis improve with time. Chronic laryngitis is laryngitis that lasts for more than three weeks. °CAUSES °Acute laryngitis may be caused by: °· A viral infection. °· Lots of talking, yelling, or singing. This is also called vocal strain. °· Bacterial infections. °Chronic laryngitis may be caused by: °· Vocal strain. °· Injury to your vocal cords. °· Acid reflux (gastroesophageal reflux disease or GERD). °· Allergies. °· Sinus infection. °· Smoking. °· Alcohol abuse. °· Breathing in chemicals or dust. °· Growths on the vocal cords. °RISK FACTORS °Risk factors for laryngitis include: °· Smoking. °· Alcohol abuse. °· Having allergies. °SIGNS AND SYMPTOMS °Symptoms of laryngitis may include: °· Low, hoarse voice. °· Loss of voice. °· Dry cough. °· Sore throat. °· Stuffy nose. °DIAGNOSIS °Laryngitis may be diagnosed by: °· Physical exam. °· Throat culture. °· Blood test. °· Laryngoscopy. This procedure allows your health care provider to look at your vocal cords with a mirror or viewing tube. °TREATMENT °Treatment for laryngitis depends on what is causing it. Usually, treatment involves resting your voice and using medicines to soothe your throat. However, if your laryngitis is caused by a bacterial infection, you may need to take antibiotic medicine. If your laryngitis is caused by a growth, you may need to have a procedure to remove it. °HOME CARE INSTRUCTIONS °· Drink enough fluid to keep your urine clear or pale yellow. °· Breathe in moist air. Use a  humidifier if you live in a dry climate. °· Take medicines only as directed by your health care provider. °· If you were prescribed an antibiotic medicine, finish it all even if you start to feel better. °· Do not smoke cigarettes or electronic cigarettes. If you need help quitting, ask your health care provider. °· Talk as little as possible. Also avoid whispering, which can cause vocal strain. °· Write instead of talking. Do this until your voice is back to normal. °SEEK MEDICAL CARE IF: °· You have a fever. °· You have increasing pain. °· You have difficulty swallowing. °SEEK IMMEDIATE MEDICAL CARE IF: °· You cough up blood. °· You have trouble breathing. °  °This information is not intended to replace advice given to you by your health care provider. Make sure you discuss any questions you have with your health care provider. °  °Document Released: 05/14/2005 Document Revised: 06/04/2014 Document Reviewed: 10/27/2013 °Elsevier Interactive Patient Education ©2016 [employer] ° °Upper Respiratory Infection, Adult °Most upper respiratory infections (URIs) are a viral infection of the air passages leading to the lungs. A URI affects the nose, throat, and upper air passages. The most common type of URI is nasopharyngitis and is typically referred to as "the common cold." °URIs run their course and usually go away on their own. Most of the time, a URI does not require medical attention, but sometimes a bacterial infection in the upper airways can follow a viral infection. This is called a secondary infection. Sinus and middle ear infections are common types of secondary upper respiratory infections. °Bacterial pneumonia can also complicate a URI. A URI can worsen   asthma and chronic obstructive pulmonary disease (COPD). Sometimes, these complications can require emergency medical care and may be life threatening.  CAUSES Almost all URIs are caused by viruses. A virus is a type of germ and can spread from one person  to another.  RISKS FACTORS You may be at risk for a URI if:   You smoke.   You have chronic heart or lung disease.  You have a weakened defense (immune) system.   You are very young or very old.   You have nasal allergies or asthma.  You work in crowded or poorly ventilated areas.  You work in health care facilities or schools. SIGNS AND SYMPTOMS  Symptoms typically develop 2-3 days after you come in contact with a cold virus. Most viral URIs last 7-10 days. However, viral URIs from the influenza virus (flu virus) can last 14-18 days and are typically more severe. Symptoms may include:   Runny or stuffy (congested) nose.   Sneezing.   Cough.   Sore throat.   Headache.   Fatigue.   Fever.   Loss of appetite.   Pain in your forehead, behind your eyes, and over your cheekbones (sinus pain).  Muscle aches.  DIAGNOSIS  Your health care provider may diagnose a URI by:  Physical exam.  Tests to check that your symptoms are not due to another condition such as:  Strep throat.  Sinusitis.  Pneumonia.  Asthma. TREATMENT  A URI goes away on its own with time. It cannot be cured with medicines, but medicines may be prescribed or recommended to relieve symptoms. Medicines may help:  Reduce your fever.  Reduce your cough.  Relieve nasal congestion. HOME CARE INSTRUCTIONS   Take medicines only as directed by your health care provider.   Gargle warm saltwater or take cough drops to comfort your throat as directed by your health care provider.  Use a warm mist humidifier or inhale steam from a shower to increase air moisture. This may make it easier to breathe.  Drink enough fluid to keep your urine clear or pale yellow.   Eat soups and other clear broths and maintain good nutrition.   Rest as needed.   Return to work when your temperature has returned to normal or as your health care provider advises. You may need to stay home longer to avoid  infecting others. You can also use a face mask and careful hand washing to prevent spread of the virus.  Increase the usage of your inhaler if you have asthma.   Do not use any tobacco products, including cigarettes, chewing tobacco, or electronic cigarettes. If you need help quitting, ask your health care provider. PREVENTION  The best way to protect yourself from getting a cold is to practice good hygiene.   Avoid oral or hand contact with people with cold symptoms.   Wash your hands often if contact occurs.  There is no clear evidence that vitamin C, vitamin E, echinacea, or exercise reduces the chance of developing a cold. However, it is always recommended to get plenty of rest, exercise, and practice good nutrition.  SEEK MEDICAL CARE IF:   You are getting worse rather than better.   Your symptoms are not controlled by medicine.   You have chills.  You have worsening shortness of breath.  You have brown or red mucus.  You have yellow or brown nasal discharge.  You have pain in your face, especially when you bend forward.  You have a fever.  You have swollen neck glands.  You have pain while swallowing.  You have white areas in the back of your throat. SEEK IMMEDIATE MEDICAL CARE IF:   You have severe or persistent:  Headache.  Ear pain.  Sinus pain.  Chest pain.  You have chronic lung disease and any of the following:  Wheezing.  Prolonged cough.  Coughing up blood.  A change in your usual mucus.  You have a stiff neck.  You have changes in your:  Vision.  Hearing.  Thinking.  Mood. MAKE SURE YOU:   Understand these instructions.  Will watch your condition.  Will get help right away if you are not doing well or get worse.   This information is not intended to replace advice given to you by your health care provider. Make sure you discuss any questions you have with your health care provider.   Document Released: 11/07/2000  Document Revised: 09/28/2014 Document Reviewed: 08/19/2013 Elsevier Interactive Patient Education 2016 [employer] Inc[employer]  Viral Infections A virus is a type of germ. Viruses can cause:  Minor sore throats.  Aches and pains.  Headaches.  Runny nose.  Rashes.  Watery eyes.  Tiredness.  Coughs.  Loss of appetite.  Feeling sick to your stomach (nausea).  Throwing up (vomiting).  Watery poop (diarrhea). HOME CARE   Only take medicines as told by your doctor.  Drink enough water and fluids to keep your pee (urine) clear or pale yellow. Sports drinks are a good choice.  Get plenty of rest and eat healthy. Soups and broths with crackers or rice are fine. GET HELP RIGHT AWAY IF:   You have a very bad headache.  You have shortness of breath.  You have chest pain or neck pain.  You have an unusual rash.  You cannot stop throwing up.  You have watery poop that does not stop.  You cannot keep fluids down.  You or your child has a temperature by mouth above 102 F (38.9 C), not controlled by medicine.  Your baby is older than 3 months with a rectal temperature of 102 F (38.9 C) or higher.  Your baby is 29 months old or younger with a rectal temperature of 100.4 F (38 C) or higher. MAKE SURE YOU:   Understand these instructions.  Will watch this condition.  Will get help right away if you are not doing well or get worse.   This information is not intended to replace advice given to you by your health care provider. Make sure you discuss any questions you have with your health care provider.   Document Released: 04/26/2008 Document Revised: 08/06/2011 Document Reviewed: 10/20/2014 Elsevier Interactive Patient Education [employer]

## 2015-12-20 ENCOUNTER — Encounter: Payer: Self-pay | Admitting: Emergency Medicine

## 2015-12-20 ENCOUNTER — Emergency Department
Admission: EM | Admit: 2015-12-20 | Discharge: 2015-12-20 | Disposition: A | Discharge status: Home / Self Care | Payer: Self-pay | Attending: Emergency Medicine | Admitting: Emergency Medicine

## 2015-12-20 DIAGNOSIS — Z955 Presence of coronary angioplasty implant and graft: Secondary | ICD-10-CM | POA: Insufficient documentation

## 2015-12-20 DIAGNOSIS — R059 Cough, unspecified: Secondary | ICD-10-CM

## 2015-12-20 DIAGNOSIS — Z7982 Long term (current) use of aspirin: Secondary | ICD-10-CM | POA: Insufficient documentation

## 2015-12-20 DIAGNOSIS — R05 Cough: Secondary | ICD-10-CM

## 2015-12-20 DIAGNOSIS — B948 Sequelae of other specified infectious and parasitic diseases: Secondary | ICD-10-CM

## 2015-12-20 DIAGNOSIS — G933 Postviral fatigue syndrome: Secondary | ICD-10-CM | POA: Insufficient documentation

## 2015-12-20 DIAGNOSIS — E785 Hyperlipidemia, unspecified: Secondary | ICD-10-CM | POA: Insufficient documentation

## 2015-12-20 DIAGNOSIS — I252 Old myocardial infarction: Secondary | ICD-10-CM | POA: Insufficient documentation

## 2015-12-20 DIAGNOSIS — I10 Essential (primary) hypertension: Secondary | ICD-10-CM | POA: Insufficient documentation

## 2015-12-20 DIAGNOSIS — Z79899 Other long term (current) drug therapy: Secondary | ICD-10-CM | POA: Insufficient documentation

## 2015-12-20 DIAGNOSIS — F17210 Nicotine dependence, cigarettes, uncomplicated: Secondary | ICD-10-CM | POA: Insufficient documentation

## 2015-12-20 LAB — URINALYSIS COMPLETE WITH MICROSCOPIC (ARMC ONLY)
BILIRUBIN URINE: NEGATIVE
Bacteria, UA: NONE SEEN
GLUCOSE, UA: NEGATIVE mg/dL
KETONES UR: NEGATIVE mg/dL
Nitrite: NEGATIVE
PROTEIN: NEGATIVE mg/dL
Specific Gravity, Urine: 1.018 (ref 1.005–1.030)
pH: 5 (ref 5.0–8.0)

## 2015-12-20 LAB — BASIC METABOLIC PANEL
Anion gap: 10 (ref 5–15)
BUN: 15 mg/dL (ref 6–20)
CALCIUM: 9.4 mg/dL (ref 8.9–10.3)
CO2: 26 mmol/L (ref 22–32)
CREATININE: 0.98 mg/dL (ref 0.44–1.00)
Chloride: 103 mmol/L (ref 101–111)
GFR calc Af Amer: >60 mL/min (ref >60)
GFR calc non Af Amer: >60 mL/min (ref >60)
GLUCOSE: 109 mg/dL — AB (ref 65–99)
Potassium: 3.5 mmol/L (ref 3.5–5.1)
Sodium: 139 mmol/L (ref 135–145)

## 2015-12-20 LAB — TROPONIN I: Troponin I: 0.03 ng/mL (ref <0.03)

## 2015-12-20 LAB — CBC
HCT: 42.1 % (ref 35.0–47.0)
Hemoglobin: 14.9 g/dL (ref 12.0–16.0)
MCH: 30.9 pg (ref 26.0–34.0)
MCHC: 35.3 g/dL (ref 32.0–36.0)
MCV: 87.6 fL (ref 80.0–100.0)
PLATELETS: 238 10*3/uL (ref 150–440)
RBC: 4.81 MIL/uL (ref 3.80–5.20)
RDW: 13.1 % (ref 11.5–14.5)
WBC: 11.2 10*3/uL — ABNORMAL HIGH (ref 3.6–11.0)

## 2015-12-20 LAB — POCT PREGNANCY, URINE: Preg Test, Ur: NEGATIVE

## 2015-12-20 MED ORDER — SODIUM CHLORIDE 0.9 % IV BOLUS (SEPSIS)
1000.0000 mL | Freq: Once | INTRAVENOUS | Status: AC
  Administered 2015-12-20: 1000 mL via INTRAVENOUS

## 2015-12-20 NOTE — ED Triage Notes (Signed)
Pt to ed with c/o right sided upper back pain, cough x 4 weeks, and weakness x 3 days.

## 2015-12-20 NOTE — Discharge Instructions (Signed)

## 2015-12-20 NOTE — ED Provider Notes (Signed)
[facility] Emergency Department Provider Note  ____________________________________________  Time seen: Approximately 9:43 PM  I have reviewed the triage vital signs and the nursing notes.   HISTORY  Chief Complaint Cough and Weakness    HPI [name] is a 35 y.o. female presents today for evaluation of a cough. Patient tells me that she went to work today, she's been coughing for about a month now and that they told her she needed to see a doctor.  Patient denies any chest pain or shortness of breath. She reports that she's been having aching in the muscles of her back for about the last 4 days as well. She tells me she was seen at the [facility] ER for the same a few days ago as well and was told everything was okay, but she came today because her work told her she needed further evaluation.  She denies any severe chest pain, she reports she is not having symptoms such as a heart attack. He takes Plavix and aspirin daily. Denies pregnancy. She reports his cough all started after she had a "cold" the last about a week. The cold symptoms were related to the cough has persisted.    Past Medical History:  Diagnosis Date  . Hyperlipidemia   . Hypertension   . Myocardial infarction [facility])    2014    Patient Active Problem List   Diagnosis Date Noted  . Trimalleolar fracture of ankle, closed 02/28/2015  . NSTEMI (non-ST elevated myocardial infarction) (HCC) 10/14/2014    Past Surgical History:  Procedure Laterality Date  . CARDIAC CATHETERIZATION N/A 10/15/2014   Procedure: Left Heart Cath;  Surgeon: Dalia Heading, MD;  Location: [facility] INVASIVE CV LAB;  Service: Cardiovascular;  Laterality: N/A;  . CARDIAC SURGERY  Cardiac stent to right coronary artery  . ORIF ANKLE FRACTURE Right 03/01/2015   Procedure: OPEN REDUCTION INTERNAL FIXATION (ORIF) ANKLE FRACTURE;  Surgeon: Kennedy Bucker, MD;  Location: [facility] ORS;  Service: Orthopedics;  Laterality: Right;  . STENT  PLACEMENT VASCULAR ([facility] HX)    . TUBAL LIGATION  2004    Current Outpatient Rx  . Order #: [id number] Class: Print  . Order #: [id number] Class: Normal  . Order #: [id number] Class: Print  . Order #: [id number] Class: Print  . Order #: [id number] Class: Print  . Order #: [id number] Class: Print  . Order #: [id number] Class: Print  . Order #: [id number] Class: Print  . Order #: [id number] Class: Print  . Order #: [id number] Class: Print  . Order #: [id number] Class: Print  . Order #: [id number] Class: Print    Allergies Review of patient's allergies indicates no known allergies.  Family History  Problem Relation Age of Onset  . Diabetes Mother   . Hypertension Mother   . Diabetes Father   . Hypertension Father   . Heart attack Sister     Social History Social History  Substance Use Topics  . Smoking status: Current Every Day Smoker    Packs/day: 1.00    Types: Cigarettes  . Smokeless tobacco: Never Used  . Alcohol use Yes    Review of Systems Constitutional: No fever/chills. Eyes: No visual changes. ENT: No sore throat. Cardiovascular: Denies chest pain. Respiratory: Denies shortness of breath.Dry nonproductive cough. Gastrointestinal: No abdominal pain.  No nausea, no vomiting.  Genitourinary: Negative for dysuria. Musculoskeletal: Negative for back pain Except for achiness in the upper back when she coughs. Skin: Negative for rash. Neurological: Negative for headaches, focal weakness or numbness.  10-point ROS otherwise negative.  Pulmonary Embolism Rule-out Criteria (PERC rule)                        If YES to ANY of the following, the PERC rule is not satisfied and cannot be used to rule out PE in this patient (consider d-dimer or imaging depending on pre-test probability).                      If NO to ALL of the following, AND the clinician's pre-test probability is <15%, the [facility] rule is satisfied and there is no need for further workup (including no need to obtain a  d-dimer) as the post-test probability of pulmonary embolism is <2%.                      Mnemonic is HAD CLOTS   H - hormone use (exogenous estrogen)      No. A - age > 50                                                 No. D - DVT/PE history                                      No.   C - coughing blood (hemoptysis)                 No. L - leg swelling, unilateral                             No. O - O2 Sat on Room Air < 95%                  No. T - tachycardia (HR ? 100)                         No. S - surgery or trauma, recent                      No.   Based on my evaluation of the patient, including application of this decision instrument, further testing to evaluate for pulmonary embolism is not indicated at this time. ____________________________________________   PHYSICAL EXAM:  VITAL SIGNS: ED Triage Vitals [12/20/15 1748]  Enc Vitals Group     BP 140/88     Pulse Rate 98     Resp 18     Temp 98.7 F (37.1 C)     Temp Source Oral     SpO2 99 %     Weight 179 lb (81.2 kg)     Height 5' (1.524 m)     Head Circumference      Peak Flow      Pain Score 7     Pain Loc      Pain Edu?      Excl. in GC?    Constitutional: Alert and oriented. Well appearing and in no acute distress. Pleasant. Using her phone. Eyes: Conjunctivae are normal. PERRL. EOMI. Head: Atraumatic. Nose: No congestion/rhinnorhea. Mouth/Throat: Mucous membranes are moist.  Oropharynx non-erythematous. Neck: No stridor.   Cardiovascular: Normal rate, regular rhythm. Grossly normal  heart sounds.  Good peripheral circulation. Respiratory: Normal respiratory effort.  No retractions. Lungs CTAB. Patient does have an occasional dry nonproductive cough. She speaks in full clear normal sentences. Gastrointestinal: Soft and nontender. No distention. No abdominal bruits. No CVA tenderness. Musculoskeletal: No lower extremity tenderness nor edema.  Some mild tenderness to palpation across the trapezius muscles  of the neck. No meningismus. Neurologic:  Normal speech and language. No gross focal neurologic deficits are appreciated.  Skin:  Skin is warm, dry and intact. No rash noted. Psychiatric: Mood and affect are normal. Speech and behavior are normal.  ____________________________________________   LABS (all labs ordered are listed, but only abnormal results are displayed)  Labs Reviewed  BASIC METABOLIC PANEL - Abnormal; Notable for the following:       Result Value   Glucose, Bld 109 (*)    All other components within normal limits  CBC - Abnormal; Notable for the following:    WBC 11.2 (*)    All other components within normal limits  URINALYSIS COMPLETEWITH MICROSCOPIC ([facility] ONLY) - Abnormal; Notable for the following:    Color, Urine YELLOW (*)    APPearance CLOUDY (*)    Hgb urine dipstick 1+ (*)    Leukocytes, UA 1+ (*)    Squamous Epithelial / LPF 6-30 (*)    All other components within normal limits  TROPONIN I - Abnormal; Notable for the following:    Troponin I 0.03 (*)    All other components within normal limits  TROPONIN I  POCT PREGNANCY, URINE   ____________________________________________  EKG  Reviewed and interpreted by me at 1810 Ventricular rate 95 PR 120 QTc 470 Normal sinus rhythm, probable early repolarization seen in anteroseptal distribution, 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 No evidence of acute ST elevation. 2 EKGs performed in the ER is the patient's first troponin was 0.03, however discussed with the patient she denies any chest pain or pressure. She does report just achiness her back when she coughs. She reports descended without anything like when she had her previous heart problem. Serial 12 leads reveal similar findings, I find no evidence for acute ST elevation MI. Second troponin repeated normal in the ER. Her symptoms are very atypical ____________________________________________  RADIOLOGY  Discussed and offered x-ray, but the  patient tells me that she was seen at the [facility] ER and had an x-ray just a couple of days ago and was told it was normal for the same reason. After further discussion and shared medical decision making we will skip x-ray today. There is no hypoxia, lung sounds are clear and she reports normal respirations. ____________________________________________   PROCEDURES  Procedure(s) performed: None  Critical Care performed: No  ____________________________________________   INITIAL IMPRESSION / ASSESSMENT AND PLAN / ED COURSE  Pertinent labs & imaging results that were available during my care of the patient were reviewed by me and considered in my medical decision making (see chart for details).    Presentation appears most consistent with postviral cough. She reports she had a cold, and she's had this persistent cough since. She reports achiness in the muscles of her back and neck, and this is evidently been ongoing for several days. She is noted to have a visit to the [facility] ER where she was also discharged recently. EKG showed no new changes, first troponin was just slightly abnormal 0.03, but the second one was normal and her symptoms are very atypical and I doubt this represents acute  coronary syndrome. I will discharge the patient home, careful discharge instructions reviewed. Patient reports at the time of discharge she feels well with no complaint.  Clinical Course   ____________________________________________   FINAL CLINICAL IMPRESSION(S) / ED DIAGNOSES  Final diagnoses:  Cough  Post-viral disorder      Sharyn Creamer, MD 12/20/15 2155

## 2015-12-20 NOTE — ED Notes (Signed)
EDP shown new EKG

## 2016-01-03 ENCOUNTER — Encounter: Payer: Self-pay | Admitting: Emergency Medicine

## 2016-01-03 DIAGNOSIS — F17210 Nicotine dependence, cigarettes, uncomplicated: Secondary | ICD-10-CM | POA: Insufficient documentation

## 2016-01-03 DIAGNOSIS — I252 Old myocardial infarction: Secondary | ICD-10-CM | POA: Insufficient documentation

## 2016-01-03 DIAGNOSIS — Z7982 Long term (current) use of aspirin: Secondary | ICD-10-CM | POA: Insufficient documentation

## 2016-01-03 DIAGNOSIS — Z79899 Other long term (current) drug therapy: Secondary | ICD-10-CM | POA: Insufficient documentation

## 2016-01-03 DIAGNOSIS — I10 Essential (primary) hypertension: Secondary | ICD-10-CM | POA: Insufficient documentation

## 2016-01-03 DIAGNOSIS — G44209 Tension-type headache, unspecified, not intractable: Secondary | ICD-10-CM | POA: Insufficient documentation

## 2016-01-03 NOTE — ED Notes (Signed)
AAOx3.  Moving all extremities equally and strong.  Gait steady . Posture upright and relaxed.  NAD

## 2016-01-03 NOTE — ED Triage Notes (Signed)
C/O headache to right temple that radiates through head to right neck and right arm.  Symptoms have been present for 2 days.  Taking tylenol with no relief.

## 2016-01-04 ENCOUNTER — Emergency Department
Admission: EM | Admit: 2016-01-04 | Discharge: 2016-01-04 | Disposition: A | Discharge status: Home / Self Care | Payer: Self-pay | Attending: Emergency Medicine | Admitting: Emergency Medicine

## 2016-01-04 DIAGNOSIS — G44209 Tension-type headache, unspecified, not intractable: Secondary | ICD-10-CM

## 2016-01-04 MED ORDER — DIAZEPAM 2 MG PO TABS
ORAL_TABLET | ORAL | Status: AC
  Administered 2016-01-04: 2 mg via ORAL
  Filled 2016-01-04: qty 1

## 2016-01-04 MED ORDER — DIAZEPAM 2 MG PO TABS
2.0000 mg | ORAL_TABLET | Freq: Once | ORAL | Status: AC
  Administered 2016-01-04: 2 mg via ORAL

## 2016-01-04 NOTE — ED Provider Notes (Signed)
[facility] Provider Note  ____________________________________________   First MD Initiated Contact with Patient 01/04/16 [id number]     (approximate)  I have reviewed the triage vital signs and the nursing notes.   HISTORY  Chief Complaint Migraine    HPI [name] is a 35 y.o. female reports extensive chronic medical problems who presents for evaluation of gradual onset but persistent posterior headachethat is nothing going on about one week.  She reports that it is in the back of her head and neck in radiates down her shoulders.  Having her daughter's rub her neck and her shoulders makes it feel better and nothing in particular makes it worse.  She states that it has been uncomfortable and has been keeping her from sleeping although when I entered the exam room she was asleep and needed to be awakened gently.  She has not talked her primary care doctor about these issues.  She denies visual changes, numbness, tingling, weakness in any of her extremities, chest pain, shortness of breath, nausea, vomiting, diarrhea, abdominal pain, dysuria.  She describes the severity as severe at times although mild currently.   Past Medical History:  Diagnosis Date  . Hyperlipidemia   . Hypertension   . Myocardial infarction [facility](HCC)    2014    Patient Active Problem List   Diagnosis Date Noted  . Trimalleolar fracture of ankle, closed 02/28/2015  . NSTEMI (non-ST elevated myocardial infarction) (HCC) 10/14/2014    Past Surgical History:  Procedure Laterality Date  . CARDIAC CATHETERIZATION N/A 10/15/2014   Procedure: Left Heart Cath;  Surgeon: Dalia HeadingKenneth A Fath, MD;  Location: [facility] INVASIVE CV LAB;  Service: Cardiovascular;  Laterality: N/A;  . CARDIAC SURGERY  Cardiac stent to right coronary artery  . ORIF ANKLE FRACTURE Right 03/01/2015   Procedure: OPEN REDUCTION INTERNAL FIXATION (ORIF) ANKLE FRACTURE;  Surgeon: Kennedy BuckerMichael Menz, MD;  Location: [facility]  ORS;  Service: Orthopedics;  Laterality: Right;  . STENT PLACEMENT VASCULAR ([facility] HX)    . TUBAL LIGATION  2004    Prior to Admission medications   Medication Sig Start Date End Date Taking? Authorizing Provider  amLODipine (NORVASC) 5 MG tablet Take 1 tablet (5 mg total) by mouth daily. 10/16/14   Enid Baasadhika Kalisetti, MD  aspirin EC 325 MG tablet Take 1 tablet (325 mg total) by mouth daily. 03/02/15   Evon Slackhomas C Gaines, PA-C  aspirin EC 81 MG tablet Take 1 tablet (81 mg total) by mouth daily. 10/16/14   Enid Baasadhika Kalisetti, MD  atorvastatin (LIPITOR) 80 MG tablet Take 1 tablet (80 mg total) by mouth daily. 10/16/14   Enid Baasadhika Kalisetti, MD  benazepril (LOTENSIN) 20 MG tablet Take 1 tablet (20 mg total) by mouth daily. 10/16/14   Enid Baasadhika Kalisetti, MD  clopidogrel (PLAVIX) 75 MG tablet Take 1 tablet (75 mg total) by mouth daily. 10/16/14   Enid Baasadhika Kalisetti, MD  hydrochlorothiazide (HYDRODIURIL) 25 MG tablet Take 1 tablet (25 mg total) by mouth daily. 10/16/14   Enid Baasadhika Kalisetti, MD  metoprolol tartrate (LOPRESSOR) 25 MG tablet Take 0.5 tablets (12.5 mg total) by mouth 2 (two) times daily. 10/16/14 10/16/15  Enid Baasadhika Kalisetti, MD  nitroGLYCERIN (NITROSTAT) 0.4 MG SL tablet Place 1 tablet (0.4 mg total) under the tongue every 5 (five) minutes as needed. 10/16/14 10/16/15  Enid Baasadhika Kalisetti, MD  ondansetron (ZOFRAN ODT) 4 MG disintegrating tablet Take 1 tablet (4 mg total) by mouth every 8 (eight) hours as needed for nausea or vomiting. 08/10/15  Rebecka Apley, MD  oxyCODONE (OXY IR/ROXICODONE) 5 MG immediate release tablet Take 1-2 tablets (5-10 mg total) by mouth every 3 (three) hours as needed for breakthrough pain. 03/02/15   Evon Slack, PA-C  traZODone (DESYREL) 50 MG tablet Take 1 tablet (50 mg total) by mouth at bedtime as needed. 10/16/14   Enid Baas, MD    Allergies Review of patient's allergies indicates no known allergies.  Family History  Problem Relation Age of Onset  . Diabetes  Mother   . Hypertension Mother   . Diabetes Father   . Hypertension Father   . Heart attack Sister     Social History Social History  Substance Use Topics  . Smoking status: Current Every Day Smoker    Packs/day: 1.00    Types: Cigarettes  . Smokeless tobacco: Never Used  . Alcohol use Yes    Review of Systems Constitutional: No fever/chills Eyes: No visual changes. ENT: No sore throat. Cardiovascular: Denies chest pain. Respiratory: Denies shortness of breath. Gastrointestinal: No abdominal pain.  No nausea, no vomiting.  No diarrhea.  No constipation. Genitourinary: Negative for dysuria. Musculoskeletal: Posterior headache that radiates into neck and into shoulders Skin: Negative for rash. Neurological: Posterior headache, no focal neurological deficits  10-point ROS otherwise negative.  ____________________________________________   PHYSICAL EXAM:  VITAL SIGNS: ED Triage Vitals  Enc Vitals Group     BP 01/03/16 2138 (!) 159/99     Pulse Rate 01/03/16 2138 98     Resp 01/03/16 2138 18     Temp 01/03/16 2138 98.2 F (36.8 C)     Temp Source 01/03/16 2138 Oral     SpO2 01/03/16 2138 99 %     Weight 01/03/16 2138 180 lb (81.6 kg)     Height 01/03/16 2138 5' (1.524 m)     Head Circumference --      Peak Flow --      Pain Score 01/03/16 2136 9     Pain Loc --      Pain Edu? --      Excl. in GC? --     Constitutional: Alert and oriented. Well appearing and in no acute distress. Sleeping when I entered the exam room Eyes: Conjunctivae are normal. PERRL. EOMI. well visualized funduscopic exam with no evidence of papilledema Head: Atraumatic. Nose: No congestion/rhinnorhea. Mouth/Throat: Mucous membranes are moist.  Oropharynx non-erythematous. Neck: No stridor.  No meningeal signs.  No cervical spine tenderness to palpation. Cardiovascular: Normal rate, regular rhythm. Good peripheral circulation. Grossly normal heart sounds.   Respiratory: Normal respiratory  effort.  No retractions. Lungs CTAB. Gastrointestinal: Soft and nontender. No distention.  Musculoskeletal: No lower extremity tenderness nor edema. No gross deformities of extremities.  She states that rubbing the muscles of her neck and shoulders makes the pain feel better Neurologic:  Normal speech and language. No gross focal neurologic deficits are appreciated.  Skin:  Skin is warm, dry and intact. No rash noted. Psychiatric: Mood and affect are flat. Speech and behavior are normal.  ____________________________________________   LABS (all labs ordered are listed, but only abnormal results are displayed)  Labs Reviewed - No data to display ____________________________________________  EKG  None ____________________________________________  RADIOLOGY   No results found.  ____________________________________________   PROCEDURES  Procedure(s) performed:   Procedures   Critical Care performed: No ____________________________________________   INITIAL IMPRESSION / ASSESSMENT AND PLAN / ED COURSE  Pertinent labs & imaging results that were available during my care of  the patient were reviewed by me and considered in my medical decision making (see chart for details).  The patient has no concerning findings on physical exam or history.  Her vital signs are normal.  She is in no acute distress.  Her signs and symptoms are most consistent with a tension headache or muscular discomfort in the neck and shoulders.  I explained to her that I do not have a specific scan or treatment that is likely to fix the problem.  I gave her Valium 2 mg to help with questionable muscle spasm/tension and will advise NSAIDs, heat versus ice therapy, and outpatient follow-up.  ____________________________________________  FINAL CLINICAL IMPRESSION(S) / ED DIAGNOSES  Final diagnoses:  Tension headache     MEDICATIONS GIVEN DURING THIS VISIT:  Medications  diazepam (VALIUM) tablet 2  mg (2 mg Oral Given 01/04/16 0254)     NEW OUTPATIENT MEDICATIONS STARTED DURING THIS VISIT:  New Prescriptions   No medications on file      Note:  This document was prepared using Dragon voice recognition software and may include unintentional dictation errors.    Loleta Rose, MD 01/04/16 [phone]

## 2016-01-04 NOTE — ED Notes (Addendum)
Pt. Reporting pain in the posterior right head that radiates down the neck and right arm. Pt. States OTC tylenol not helping with pain and the only thing that helps is having "my kids squeeze my neck and throat and down my arm to get the pain to loosen up some". Pt. States a tight, squeezing pain associated with numbness and tingling down the neck and right arm. Pt. States she knows this is not a headache as she was previously dx/tx for. No neuro deficits present, full ROM, no presenting weakness, denies fever, denies N/V.    reports blurry vision in the right eye,  lightheadedness.

## 2016-01-04 NOTE — Discharge Instructions (Signed)
Your description of your headache and neck and shoulder discomfort sounds like musculoskeletal tension or a tension headache.  The fact that it feels better when somebody rubs the muscles is reassuring.  We encourage you to try over-the-counter medications such as ibuprofen or naproxen (you can also take Tylenol along with either of those medications).  Some people feel better with ice packs on the sore areas and some people feel better with heating pads, we encourage you to try both and see which one works better for you.  Please follow-up with your regular doctor at the next available opportunity to discuss additional outpatient management.  Return to the emergency department if you develop new or worsening symptoms that concern you.

## 2016-10-08 ENCOUNTER — Emergency Department
Admission: EM | Admit: 2016-10-08 | Discharge: 2016-10-08 | Disposition: A | Discharge status: Home / Self Care | Payer: Self-pay | Attending: Emergency Medicine | Admitting: Emergency Medicine

## 2016-10-08 ENCOUNTER — Encounter: Payer: Self-pay | Admitting: Emergency Medicine

## 2016-10-08 DIAGNOSIS — I252 Old myocardial infarction: Secondary | ICD-10-CM | POA: Insufficient documentation

## 2016-10-08 DIAGNOSIS — K0889 Other specified disorders of teeth and supporting structures: Secondary | ICD-10-CM | POA: Insufficient documentation

## 2016-10-08 DIAGNOSIS — Z79899 Other long term (current) drug therapy: Secondary | ICD-10-CM | POA: Insufficient documentation

## 2016-10-08 DIAGNOSIS — F17210 Nicotine dependence, cigarettes, uncomplicated: Secondary | ICD-10-CM | POA: Insufficient documentation

## 2016-10-08 DIAGNOSIS — Z7982 Long term (current) use of aspirin: Secondary | ICD-10-CM | POA: Insufficient documentation

## 2016-10-08 DIAGNOSIS — Z955 Presence of coronary angioplasty implant and graft: Secondary | ICD-10-CM | POA: Insufficient documentation

## 2016-10-08 DIAGNOSIS — Z7902 Long term (current) use of antithrombotics/antiplatelets: Secondary | ICD-10-CM | POA: Insufficient documentation

## 2016-10-08 DIAGNOSIS — I10 Essential (primary) hypertension: Secondary | ICD-10-CM | POA: Insufficient documentation

## 2016-10-08 MED ORDER — NAPROXEN 500 MG PO TABS: 500.0000 mg | ORAL_TABLET | Freq: Two times a day (BID) | ORAL | 0 refills | Status: DC

## 2016-10-08 MED ORDER — AMOXICILLIN 500 MG PO TABS
500.0000 mg | ORAL_TABLET | Freq: Three times a day (TID) | ORAL | 0 refills | Status: DC
Start: 2016-10-08 — End: 2018-06-03

## 2016-10-08 NOTE — ED Triage Notes (Signed)
Tooth pain began last week, today, pain at right temple, states site is swollen as well. Pt does have hypertension and took her medicine yesterday, however, has had none this am.

## 2016-10-08 NOTE — ED Notes (Signed)
See triage note. States she thought she may have had a toothache last week  States now pain is worse   Pain is now at right temple area with some swelling

## 2016-10-08 NOTE — ED Provider Notes (Signed)
[facility] Emergency Department Provider Note ____________________________________________  Time seen: Approximately 9:15 AM  I have reviewed the triage vital signs and the nursing notes.   HISTORY  Chief Complaint Dental Pain   HPI [name] Chronister is a 36 y.o. female who presents to the emergency department for evaluation of dental pain that started last week. Pain has increased over the past 24 hours. No relief with ibuprofen.  Past Medical History:  Diagnosis Date  . Hyperlipidemia   . Hypertension   . Myocardial infarction [facility](HCC)    2014    Patient Active Problem List   Diagnosis Date Noted  . Trimalleolar fracture of ankle, closed 02/28/2015  . NSTEMI (non-ST elevated myocardial infarction) (HCC) 10/14/2014    Past Surgical History:  Procedure Laterality Date  . CARDIAC CATHETERIZATION N/A 10/15/2014   Procedure: Left Heart Cath;  Surgeon: Dalia HeadingKenneth A Fath, MD;  Location: [facility] INVASIVE CV LAB;  Service: Cardiovascular;  Laterality: N/A;  . CARDIAC SURGERY  Cardiac stent to right coronary artery  . ORIF ANKLE FRACTURE Right 03/01/2015   Procedure: OPEN REDUCTION INTERNAL FIXATION (ORIF) ANKLE FRACTURE;  Surgeon: Kennedy BuckerMichael Menz, MD;  Location: [facility] ORS;  Service: Orthopedics;  Laterality: Right;  . STENT PLACEMENT VASCULAR ([facility] HX)    . TUBAL LIGATION  2004    Prior to Admission medications   Medication Sig Start Date End Date Taking? Authorizing Provider  amLODipine (NORVASC) 5 MG tablet Take 1 tablet (5 mg total) by mouth daily. 10/16/14   Enid BaasKalisetti, Radhika, MD  amoxicillin (AMOXIL) 500 MG tablet Take 1 tablet (500 mg total) by mouth 3 (three) times daily. 10/08/16   Shan Valdes, Rulon Eisenmengerari B, FNP  aspirin EC 325 MG tablet Take 1 tablet (325 mg total) by mouth daily. 03/02/15   Evon SlackGaines, Thomas C, PA-C  aspirin EC 81 MG tablet Take 1 tablet (81 mg total) by mouth daily. 10/16/14   Enid BaasKalisetti, Radhika, MD  atorvastatin (LIPITOR) 80 MG tablet Take 1 tablet (80 mg  total) by mouth daily. 10/16/14   Enid BaasKalisetti, Radhika, MD  benazepril (LOTENSIN) 20 MG tablet Take 1 tablet (20 mg total) by mouth daily. 10/16/14   Enid BaasKalisetti, Radhika, MD  clopidogrel (PLAVIX) 75 MG tablet Take 1 tablet (75 mg total) by mouth daily. 10/16/14   Enid BaasKalisetti, Radhika, MD  hydrochlorothiazide (HYDRODIURIL) 25 MG tablet Take 1 tablet (25 mg total) by mouth daily. 10/16/14   Enid BaasKalisetti, Radhika, MD  metoprolol tartrate (LOPRESSOR) 25 MG tablet Take 0.5 tablets (12.5 mg total) by mouth 2 (two) times daily. 10/16/14 10/16/15  Enid BaasKalisetti, Radhika, MD  naproxen (NAPROSYN) 500 MG tablet Take 1 tablet (500 mg total) by mouth 2 (two) times daily with a meal. 10/08/16   Precious Segall B, FNP  nitroGLYCERIN (NITROSTAT) 0.4 MG SL tablet Place 1 tablet (0.4 mg total) under the tongue every 5 (five) minutes as needed. 10/16/14 10/16/15  Enid BaasKalisetti, Radhika, MD  ondansetron (ZOFRAN ODT) 4 MG disintegrating tablet Take 1 tablet (4 mg total) by mouth every 8 (eight) hours as needed for nausea or vomiting. 08/10/15   Rebecka ApleyWebster, Allison P, MD  oxyCODONE (OXY IR/ROXICODONE) 5 MG immediate release tablet Take 1-2 tablets (5-10 mg total) by mouth every 3 (three) hours as needed for breakthrough pain. 03/02/15   Evon SlackGaines, Thomas C, PA-C  traZODone (DESYREL) 50 MG tablet Take 1 tablet (50 mg total) by mouth at bedtime as needed. 10/16/14   Enid BaasKalisetti, Radhika, MD    Allergies Patient has no known allergies.  Family History  Problem Relation Age of Onset  . Diabetes Mother   . Hypertension Mother   . Diabetes Father   . Hypertension Father   . Heart attack Sister     Social History Social History  Substance Use Topics  . Smoking status: Current Every Day Smoker    Packs/day: 1.00    Types: Cigarettes  . Smokeless tobacco: Never Used  . Alcohol use Yes     Comment: occas    Review of Systems Constitutional: Well appearing. Negative for fever ENT: Positive for dental pain Musculoskeletal: Positive for jaw pain.  Negative for trismus. Skin: Positive for facial edema without erythema ____________________________________________   PHYSICAL EXAM:  VITAL SIGNS: ED Triage Vitals [10/08/16 0900]  Enc Vitals Group     BP (!) 200/105     Pulse Rate (!) 105     Resp 20     Temp 97.8 F (36.6 C)     Temp Source Oral     SpO2 100 %     Weight 170 lb (77.1 kg)     Height 5\' 2"  (1.575 m)     Head Circumference      Peak Flow      Pain Score 8     Pain Loc      Pain Edu?      Excl. in GC?     Constitutional: Alert and oriented. Well appearing and in no acute distress. Eyes: Conjunctivae are normal.EOMI. Mouth/Throat: Mucous membranes are moist. Oropharynx non-erythematous. Periodontal Exam    Hematological/Lymphatic/Immunilogical: No cervical lymphadenopathy. Respiratory: Normal respiratory effort.  Musculoskeletal: Full ROM x 4 extremities. Neurologic:  Normal speech and language. No gross focal neurologic deficits are appreciated. Speech is normal. Skin:  Mild edema of the right premaxillary area without erythema.  Psychiatric: Mood and affect are normal. Speech and behavior are normal.  ____________________________________________   LABS (all labs ordered are listed, but only abnormal results are displayed)  Labs Reviewed - No data to display ____________________________________________   RADIOLOGY  Not indicated. ____________________________________________   PROCEDURES  Procedure(s) performed: None  Critical Care performed: No ____________________________________________   INITIAL IMPRESSION / ASSESSMENT AND PLAN / ED COURSE  36 year old female presenting to the emergency department for treatment of dental pain. She will receive prescriptions for Amoxicillin and naproxen and be advised to also take tylenol. She is to keep her dental appointment previously scheduled and to return to the ER for symptoms that change or worsen.  Pertinent labs & imaging results that were  available during my care of the patient were reviewed by me and considered in my medical decision making (see chart for details).  ____________________________________________   FINAL CLINICAL IMPRESSION(S) / ED DIAGNOSES  Final diagnoses:  Pain, dental  Hypertension, unspecified type    New Prescriptions   AMOXICILLIN (AMOXIL) 500 MG TABLET    Take 1 tablet (500 mg total) by mouth 3 (three) times daily.   NAPROXEN (NAPROSYN) 500 MG TABLET    Take 1 tablet (500 mg total) by mouth 2 (two) times daily with a meal.    If controlled substance prescribed during this visit, 12 month history viewed on the [place] prior to issuing an initial prescription for Schedule II or III opiod.  Note:  This document was prepared using Dragon voice recognition software and may include unintentional dictation errors.      Chinita Pester, FNP 10/08/16 1610    Jene Every, MD 10/08/16 1218

## 2016-10-08 NOTE — Discharge Instructions (Signed)
Keep your appointment with the dentist.  Return to the ER for symptoms that change or worsen if you are unable to schedule an earlier appointment.  Follow up with your PCP for recheck of your blood pressure. Take your medications as prescribed. Return to the ER for symptoms of concern if unable to see your PCP

## 2018-06-03 ENCOUNTER — Emergency Department
Admission: EM | Admit: 2018-06-03 | Discharge: 2018-06-03 | Disposition: A | Discharge status: Home / Self Care | Payer: BLUE CROSS/BLUE SHIELD | Attending: Emergency Medicine | Admitting: Emergency Medicine

## 2018-06-03 ENCOUNTER — Encounter: Payer: Self-pay | Admitting: Emergency Medicine

## 2018-06-03 ENCOUNTER — Emergency Department: Payer: BLUE CROSS/BLUE SHIELD

## 2018-06-03 DIAGNOSIS — F17210 Nicotine dependence, cigarettes, uncomplicated: Secondary | ICD-10-CM | POA: Insufficient documentation

## 2018-06-03 DIAGNOSIS — I119 Hypertensive heart disease without heart failure: Secondary | ICD-10-CM | POA: Insufficient documentation

## 2018-06-03 DIAGNOSIS — Z79899 Other long term (current) drug therapy: Secondary | ICD-10-CM | POA: Insufficient documentation

## 2018-06-03 DIAGNOSIS — J209 Acute bronchitis, unspecified: Secondary | ICD-10-CM | POA: Diagnosis not present

## 2018-06-03 DIAGNOSIS — Z7902 Long term (current) use of antithrombotics/antiplatelets: Secondary | ICD-10-CM | POA: Insufficient documentation

## 2018-06-03 DIAGNOSIS — I252 Old myocardial infarction: Secondary | ICD-10-CM | POA: Insufficient documentation

## 2018-06-03 DIAGNOSIS — I10 Essential (primary) hypertension: Secondary | ICD-10-CM

## 2018-06-03 DIAGNOSIS — Z7982 Long term (current) use of aspirin: Secondary | ICD-10-CM | POA: Insufficient documentation

## 2018-06-03 DIAGNOSIS — R05 Cough: Secondary | ICD-10-CM | POA: Diagnosis present

## 2018-06-03 MED ORDER — ALBUTEROL SULFATE 108 (90 BASE) MCG/ACT IN AEPB: 2.0000 | INHALATION_SPRAY | Freq: Four times a day (QID) | RESPIRATORY_TRACT | 0 refills | Status: AC

## 2018-06-03 MED ORDER — METOPROLOL TARTRATE 25 MG PO TABS: 12.5000 mg | ORAL_TABLET | Freq: Two times a day (BID) | ORAL | 3 refills | Status: DC

## 2018-06-03 MED ORDER — ATORVASTATIN CALCIUM 80 MG PO TABS: 80.0000 mg | ORAL_TABLET | Freq: Every day | ORAL | 3 refills | Status: AC

## 2018-06-03 MED ORDER — BENAZEPRIL HCL 20 MG PO TABS: 20.0000 mg | ORAL_TABLET | Freq: Every day | ORAL | 3 refills | Status: DC

## 2018-06-03 MED ORDER — LISINOPRIL 10 MG PO TABS
20.0000 mg | ORAL_TABLET | Freq: Once | ORAL | Status: AC
  Administered 2018-06-03: 20 mg via ORAL
  Filled 2018-06-03: qty 2

## 2018-06-03 MED ORDER — AZITHROMYCIN 250 MG PO TABS: ORAL_TABLET | ORAL | 0 refills | Status: DC

## 2018-06-03 MED ORDER — HYDROCHLOROTHIAZIDE 25 MG PO TABS: 25.0000 mg | ORAL_TABLET | Freq: Every day | ORAL | 3 refills | Status: AC

## 2018-06-03 MED ORDER — AMLODIPINE BESYLATE 5 MG PO TABS: 5.0000 mg | ORAL_TABLET | Freq: Every day | ORAL | 3 refills | Status: DC

## 2018-06-03 NOTE — ED Triage Notes (Signed)
Pt reports her family has been sick with cough and sore throat intermittently for a month. Denies fevers.

## 2018-06-03 NOTE — ED Provider Notes (Signed)
[facility] Emergency Department Provider Note  ____________________________________________   First MD Initiated Contact with Patient 06/03/18 1045     (approximate)  I have reviewed the triage vital signs and the nursing notes.   HISTORY  Chief Complaint Cough and Sore Throat    HPI [name] is a 38 y.o. female presents emergency department complaining of cough and congestion with yellow to green mucus.  Symptoms for 30 days or more.  Denies fever, chills, chest pain or shortness of breath.  Patient states there is mold in her home.  She is worried that she is having problems due to this.  She denies any chest pain or shortness of breath.   Past Medical History:  Diagnosis Date  . Hyperlipidemia   . Hypertension   . Myocardial infarction [facility])    2014    Patient Active Problem List   Diagnosis Date Noted  . Trimalleolar fracture of ankle, closed 02/28/2015  . NSTEMI (non-ST elevated myocardial infarction) (HCC) 10/14/2014    Past Surgical History:  Procedure Laterality Date  . CARDIAC CATHETERIZATION N/A 10/15/2014   Procedure: Left Heart Cath;  Surgeon: Dalia Heading, MD;  Location: [facility] INVASIVE CV LAB;  Service: Cardiovascular;  Laterality: N/A;  . CARDIAC SURGERY  Cardiac stent to right coronary artery  . ORIF ANKLE FRACTURE Right 03/01/2015   Procedure: OPEN REDUCTION INTERNAL FIXATION (ORIF) ANKLE FRACTURE;  Surgeon: Kennedy Bucker, MD;  Location: [facility] ORS;  Service: Orthopedics;  Laterality: Right;  . STENT PLACEMENT VASCULAR ([facility] HX)    . TUBAL LIGATION  2004    Prior to Admission medications   Medication Sig Start Date End Date Taking? Authorizing Provider  Albuterol Sulfate (PROAIR RESPICLICK) 108 (90 Base) MCG/ACT AEPB Inhale 2 puffs into the lungs every 6 (six) hours. 06/03/18   Fisher, Roselyn Bering, PA-C  amLODipine (NORVASC) 5 MG tablet Take 1 tablet (5 mg total) by mouth daily. 06/03/18   Fisher, Roselyn Bering, PA-C  aspirin EC 81 MG  tablet Take 1 tablet (81 mg total) by mouth daily. 10/16/14   Enid Baas, MD  atorvastatin (LIPITOR) 80 MG tablet Take 1 tablet (80 mg total) by mouth daily. 06/03/18   Fisher, Roselyn Bering, PA-C  azithromycin (ZITHROMAX Z-PAK) 250 MG tablet 2 pills today then 1 pill a day for 4 days 06/03/18   Sherrie Mustache Roselyn Bering, PA-C  benazepril (LOTENSIN) 20 MG tablet Take 1 tablet (20 mg total) by mouth daily. 06/03/18   Fisher, Roselyn Bering, PA-C  clopidogrel (PLAVIX) 75 MG tablet Take 1 tablet (75 mg total) by mouth daily. 10/16/14   Enid Baas, MD  hydrochlorothiazide (HYDRODIURIL) 25 MG tablet Take 1 tablet (25 mg total) by mouth daily. 06/03/18   Fisher, Roselyn Bering, PA-C  metoprolol tartrate (LOPRESSOR) 25 MG tablet Take 0.5 tablets (12.5 mg total) by mouth 2 (two) times daily. 06/03/18 06/03/19  Fisher, Roselyn Bering, PA-C  nitroGLYCERIN (NITROSTAT) 0.4 MG SL tablet Place 1 tablet (0.4 mg total) under the tongue every 5 (five) minutes as needed. 10/16/14 10/16/15  Enid Baas, MD  traZODone (DESYREL) 50 MG tablet Take 1 tablet (50 mg total) by mouth at bedtime as needed. 10/16/14   Enid Baas, MD    Allergies Patient has no known allergies.  Family History  Problem Relation Age of Onset  . Diabetes Mother   . Hypertension Mother   . Diabetes Father   . Hypertension Father   . Heart attack Sister     Social  History Social History   Tobacco Use  . Smoking status: Current Every Day Smoker    Packs/day: 1.00    Types: Cigarettes  . Smokeless tobacco: Never Used  Substance Use Topics  . Alcohol use: Yes    Comment: occas  . Drug use: No    Review of Systems  Constitutional: No fever/chills Eyes: No visual changes. ENT: No sore throat. Respiratory: Positive cough and congestion, positive wheezing Genitourinary: Negative for dysuria. Musculoskeletal: Negative for back pain. Skin: Negative for rash.    ____________________________________________   PHYSICAL EXAM:  VITAL SIGNS: ED  Triage Vitals  Enc Vitals Group     BP 06/03/18 1028 (!) 201/112     Pulse Rate 06/03/18 1028 99     Resp 06/03/18 1028 20     Temp 06/03/18 1028 98 F (36.7 C)     Temp Source 06/03/18 1028 Oral     SpO2 06/03/18 1028 96 %     Weight 06/03/18 1029 170 lb (77.1 kg)     Height 06/03/18 1029 5' (1.524 m)     Head Circumference --      Peak Flow --      Pain Score 06/03/18 1028 4     Pain Loc --      Pain Edu? --      Excl. in GC? --     Constitutional: Alert and oriented. Well appearing and in no acute distress. Eyes: Conjunctivae are normal.  Head: Atraumatic. ENT: TMS clear bilaterally Nose: No congestion/rhinnorhea. Mouth/Throat: Mucous membranes are moist.   NECK: Is supple, no lymphadenopathy is noted  cardiovascular: Normal rate, regular rhythm.  Heart sounds are normal Respiratory: Normal respiratory effort.  No retractions, lungs clear to auscultation, cough is dry and hacking GU: deferred Musculoskeletal: FROM all extremities, warm and well perfused Neurologic:  Normal speech and language.  Skin:  Skin is warm, dry and intact. No rash noted. Psychiatric: Mood and affect are normal. Speech and behavior are normal.  ____________________________________________   LABS (all labs ordered are listed, but only abnormal results are displayed)  Labs Reviewed - No data to display ____________________________________________   ____________________________________________  RADIOLOGY  Chest x-ray negative for pneumonia, positive cardiomegaly  ____________________________________________   PROCEDURES  Procedure(s) performed: No  Procedures    ____________________________________________   INITIAL IMPRESSION / ASSESSMENT AND PLAN / ED COURSE  Pertinent labs & imaging results that were available during my care of the patient were reviewed by me and considered in my medical decision making (see chart for details).   Patient is a 38 year old female presents  emergency department complaining of a cough for about a month.  Physical exam is basically unremarkable.  She is concerned about mold in her home.  Chest x-ray shows chronic bronchitic changes along with cardiomegaly   Chest x-ray results were discussed with patient.  Patient's blood pressure is chronically high.  She is to take her blood pressure medicines that she has been out for couple of months.  [facility]  She is to follow-up with her regular doctor for her Plavix.  She is to continue taking the aspirin a day.  We had a strong discussion about her high blood pressure and lack of medication.  Explained to her that she will eventually end up having a stroke or an aneurysm which would kill her.  She states she understands will comply with our instructions to continue her medication.  She is to see her regular doctor in  1 week.  She was discharged stable condition.    As part of my medical decision making, I reviewed the following data within the electronic MEDICAL RECORD NUMBER Nursing notes reviewed and incorporated, Old chart reviewed, Radiograph reviewed chest x-ray is negative except for cardiomegaly, Notes from prior ED visits and [place] Controlled Substance Database  ____________________________________________   FINAL CLINICAL IMPRESSION(S) / ED DIAGNOSES  Final diagnoses:  Acute bronchitis, unspecified organism  Essential hypertension  Hypertensive cardiomegaly      NEW MEDICATIONS STARTED DURING THIS VISIT:  Discharge Medication List as of 06/03/2018 12:16 PM    START taking these medications   Details  Albuterol Sulfate (PROAIR RESPICLICK) 108 (90 Base) MCG/ACT AEPB Inhale 2 puffs into the lungs every 6 (six) hours., Starting Tue 06/03/2018, Normal    azithromycin (ZITHROMAX Z-PAK) 250 MG tablet 2 pills today then 1 pill a day for 4 days, Normal         Note:  This document was prepared using Dragon voice recognition software and may include  unintentional dictation errors.     Faythe GheeFisher, Susan W, PA-C 06/03/18 1309    Sharman CheekStafford, Phillip, MD 06/08/18 [phone]

## 2018-06-03 NOTE — Discharge Instructions (Addendum)
Follow-up with your regular doctor in 1 week for blood pressure recheck.  Take the medications as prescribed.  Call the health department about the mold in your home.  Return to the ER if worsening.

## 2018-06-03 NOTE — ED Triage Notes (Signed)
Pt concerned sx's are related to Mold in her home

## 2018-06-03 NOTE — ED Notes (Signed)
See triage note  Presents with intermittent sore throat for about 1 month  Also intermittent cough  Afebrile on arrival

## 2018-10-04 ENCOUNTER — Inpatient Hospital Stay
Admission: EM | Admit: 2018-10-04 | Discharge: 2018-10-08 | DRG: 247 | Disposition: A | Discharge status: Home / Self Care | Payer: Medicaid Other | Attending: Internal Medicine | Admitting: Internal Medicine

## 2018-10-04 ENCOUNTER — Other Ambulatory Visit: Payer: Self-pay

## 2018-10-04 ENCOUNTER — Encounter
Admission: EM | Disposition: A | Discharge status: Home / Self Care | Payer: Self-pay | Source: Home / Self Care | Attending: Internal Medicine

## 2018-10-04 ENCOUNTER — Emergency Department: Payer: Medicaid Other

## 2018-10-04 DIAGNOSIS — Z7902 Long term (current) use of antithrombotics/antiplatelets: Secondary | ICD-10-CM

## 2018-10-04 DIAGNOSIS — Z833 Family history of diabetes mellitus: Secondary | ICD-10-CM

## 2018-10-04 DIAGNOSIS — F1210 Cannabis abuse, uncomplicated: Secondary | ICD-10-CM | POA: Diagnosis present

## 2018-10-04 DIAGNOSIS — I200 Unstable angina: Secondary | ICD-10-CM

## 2018-10-04 DIAGNOSIS — Z79899 Other long term (current) drug therapy: Secondary | ICD-10-CM

## 2018-10-04 DIAGNOSIS — E785 Hyperlipidemia, unspecified: Secondary | ICD-10-CM | POA: Diagnosis present

## 2018-10-04 DIAGNOSIS — I2119 ST elevation (STEMI) myocardial infarction involving other coronary artery of inferior wall: Principal | ICD-10-CM | POA: Diagnosis present

## 2018-10-04 DIAGNOSIS — I160 Hypertensive urgency: Secondary | ICD-10-CM | POA: Diagnosis present

## 2018-10-04 DIAGNOSIS — I25110 Atherosclerotic heart disease of native coronary artery with unstable angina pectoris: Secondary | ICD-10-CM | POA: Diagnosis present

## 2018-10-04 DIAGNOSIS — I252 Old myocardial infarction: Secondary | ICD-10-CM

## 2018-10-04 DIAGNOSIS — J449 Chronic obstructive pulmonary disease, unspecified: Secondary | ICD-10-CM | POA: Diagnosis present

## 2018-10-04 DIAGNOSIS — Z8249 Family history of ischemic heart disease and other diseases of the circulatory system: Secondary | ICD-10-CM

## 2018-10-04 DIAGNOSIS — Z7982 Long term (current) use of aspirin: Secondary | ICD-10-CM

## 2018-10-04 DIAGNOSIS — Z9114 Patient's other noncompliance with medication regimen: Secondary | ICD-10-CM

## 2018-10-04 DIAGNOSIS — I119 Hypertensive heart disease without heart failure: Secondary | ICD-10-CM | POA: Diagnosis present

## 2018-10-04 DIAGNOSIS — F17210 Nicotine dependence, cigarettes, uncomplicated: Secondary | ICD-10-CM | POA: Diagnosis present

## 2018-10-04 DIAGNOSIS — I10 Essential (primary) hypertension: Secondary | ICD-10-CM

## 2018-10-04 DIAGNOSIS — Z20828 Contact with and (suspected) exposure to other viral communicable diseases: Secondary | ICD-10-CM | POA: Diagnosis present

## 2018-10-04 DIAGNOSIS — I214 Non-ST elevation (NSTEMI) myocardial infarction: Secondary | ICD-10-CM

## 2018-10-04 DIAGNOSIS — E876 Hypokalemia: Secondary | ICD-10-CM

## 2018-10-04 HISTORY — PX: LEFT HEART CATH AND CORONARY ANGIOGRAPHY: CATH118249

## 2018-10-04 HISTORY — PX: CORONARY/GRAFT ACUTE MI REVASCULARIZATION: CATH118305

## 2018-10-04 LAB — COMPREHENSIVE METABOLIC PANEL
ALT: 11 U/L (ref 0–44)
AST: 26 U/L (ref 15–41)
Albumin: 3.8 g/dL (ref 3.5–5.0)
Alkaline Phosphatase: 60 U/L (ref 38–126)
Anion gap: 13 (ref 5–15)
BUN: 18 mg/dL (ref 6–20)
CO2: 24 mmol/L (ref 22–32)
Calcium: 9.2 mg/dL (ref 8.9–10.3)
Chloride: 100 mmol/L (ref 98–111)
Creatinine, Ser: 0.96 mg/dL (ref 0.44–1.00)
GFR calc Af Amer: >60 mL/min (ref >60)
GFR calc non Af Amer: >60 mL/min (ref >60)
Glucose, Bld: 105 mg/dL — ABNORMAL HIGH (ref 70–99)
Potassium: 3.1 mmol/L — ABNORMAL LOW (ref 3.5–5.1)
Sodium: 137 mmol/L (ref 135–145)
Total Bilirubin: 0.8 mg/dL (ref 0.3–1.2)
Total Protein: 7.9 g/dL (ref 6.5–8.1)

## 2018-10-04 LAB — CBC
HCT: 43.2 % (ref 36.0–46.0)
Hemoglobin: 14.9 g/dL (ref 12.0–15.0)
MCH: 32 pg (ref 26.0–34.0)
MCHC: 34.5 g/dL (ref 30.0–36.0)
MCV: 92.9 fL (ref 80.0–100.0)
Platelets: 274 10*3/uL (ref 150–400)
RBC: 4.65 MIL/uL (ref 3.87–5.11)
RDW: 13.2 % (ref 11.5–15.5)
WBC: 11.2 10*3/uL — ABNORMAL HIGH (ref 4.0–10.5)
nRBC: 0 % (ref 0.0–0.2)

## 2018-10-04 LAB — APTT: aPTT: 25 seconds (ref 24–36)

## 2018-10-04 LAB — PROTIME-INR
INR: 0.9 (ref 0.8–1.2)
Prothrombin Time: 12 seconds (ref 11.4–15.2)

## 2018-10-04 LAB — SARS CORONAVIRUS 2 BY RT PCR (HOSPITAL ORDER, PERFORMED IN ~~LOC~~ HOSPITAL LAB): SARS Coronavirus 2: NEGATIVE

## 2018-10-04 LAB — TROPONIN I: Troponin I: 2.08 ng/mL (ref <0.03)

## 2018-10-04 SURGERY — CORONARY/GRAFT ACUTE MI REVASCULARIZATION
Anesthesia: Moderate Sedation

## 2018-10-04 MED ORDER — HEPARIN (PORCINE) IN NACL 1000-0.9 UT/500ML-% IV SOLN
INTRAVENOUS | Status: AC
  Filled 2018-10-04: qty 1000

## 2018-10-04 MED ORDER — HEPARIN (PORCINE) 25000 UT/250ML-% IV SOLN: 1050.0000 [IU]/h | INTRAVENOUS | Status: DC

## 2018-10-04 MED ORDER — HEPARIN SODIUM (PORCINE) 5000 UNIT/ML IJ SOLN
4000.0000 [IU] | Freq: Once | INTRAMUSCULAR | Status: AC
  Administered 2018-10-04: 23:00:00 4000 [IU] via INTRAVENOUS

## 2018-10-04 MED ORDER — MIDAZOLAM HCL 2 MG/2ML IJ SOLN
INTRAMUSCULAR | Status: AC
  Filled 2018-10-04: qty 2

## 2018-10-04 MED ORDER — VERAPAMIL HCL 2.5 MG/ML IV SOLN
INTRAVENOUS | Status: AC
  Filled 2018-10-04: qty 2

## 2018-10-04 MED ORDER — HEPARIN SODIUM (PORCINE) 1000 UNIT/ML IJ SOLN
INTRAMUSCULAR | Status: AC
  Filled 2018-10-04: qty 1

## 2018-10-04 MED ORDER — LABETALOL HCL 5 MG/ML IV SOLN
INTRAVENOUS | Status: AC
  Filled 2018-10-04: qty 4

## 2018-10-04 MED ORDER — NITROGLYCERIN 0.4 MG SL SUBL
0.4000 mg | SUBLINGUAL_TABLET | SUBLINGUAL | Status: DC | PRN
  Administered 2018-10-04: 0.4 mg via SUBLINGUAL

## 2018-10-04 MED ORDER — LABETALOL HCL 5 MG/ML IV SOLN
20.0000 mg | Freq: Once | INTRAVENOUS | Status: AC
  Administered 2018-10-04: 20 mg via INTRAVENOUS
  Filled 2018-10-04: qty 4

## 2018-10-04 MED ORDER — NITROGLYCERIN 1 MG/10 ML FOR IR/CATH LAB
INTRA_ARTERIAL | Status: AC
  Filled 2018-10-04: qty 10

## 2018-10-04 MED ORDER — FENTANYL CITRATE (PF) 100 MCG/2ML IJ SOLN
INTRAMUSCULAR | Status: AC
  Filled 2018-10-04: qty 2

## 2018-10-04 MED ORDER — BIVALIRUDIN TRIFLUOROACETATE 250 MG IV SOLR
INTRAVENOUS | Status: AC
  Filled 2018-10-04: qty 250

## 2018-10-04 MED ORDER — VERAPAMIL HCL 2.5 MG/ML IV SOLN
INTRAVENOUS | Status: DC | PRN
  Administered 2018-10-04: via INTRA_ARTERIAL

## 2018-10-04 SURGICAL SUPPLY — 12 items
BALLN MINITREK RX 2.0X12 (BALLOONS) ×3
BALLOON MINITREK RX 2.0X12 (BALLOONS) ×1 IMPLANT
CATH INFINITI 5FR JK (CATHETERS) ×3 IMPLANT
CATH LAUNCHER 6FR JR4 (CATHETERS) ×3 IMPLANT
DEVICE INFLAT 30 PLUS (MISCELLANEOUS) ×3 IMPLANT
DEVICE RAD TR BAND REGULAR (VASCULAR PRODUCTS) ×3 IMPLANT
GLIDESHEATH SLEND SS 6F .021 (SHEATH) ×3 IMPLANT
KIT MANI 3VAL PERCEP (MISCELLANEOUS) ×3 IMPLANT
PACK CARDIAC CATH (CUSTOM PROCEDURE TRAY) ×3 IMPLANT
STENT RESOLUTE ONYX 2.5X15 (Permanent Stent) ×3 IMPLANT
WIRE ROSEN-J .035X260CM (WIRE) ×3 IMPLANT
WIRE RUNTHROUGH .014X180CM (WIRE) ×3 IMPLANT

## 2018-10-04 NOTE — ED Notes (Signed)
Pt updated on care plan. Pt verbalizes understanding. Pt was prepped for cath lab on arrival. Belongings placed in bag at head of bed.

## 2018-10-04 NOTE — ED Notes (Signed)
Critical troponin of 2.08 called from lab. Dr. Mayford Knife notified. Pt updated that she will be going to cath lab. Pt verbalizes understanding.

## 2018-10-04 NOTE — ED Notes (Signed)
Pt in route to cath lab with April, RN and Daneil Dan., RN

## 2018-10-04 NOTE — ED Provider Notes (Addendum)
[facility] Emergency Department Provider Note       Time seen: ----------------------------------------- 10:32 PM on 10/04/2018 -----------------------------------------   I have reviewed the triage vital signs and the nursing notes.  HISTORY   Chief Complaint Code STEMI    HPI [name] is a 38 y.o. female with a history of hyperlipidemia, hypertension, MI who presents to the ED for chest pain that she describes as a tightness.  Patient was brought in route as a code STEMI.  Onset of central chest pain approximate 1 hour ago.  EMS gave 2 nitroglycerin sprays and 324 mg of aspirin.  She states the pain is 6 out of 10, nothing makes it worse at this point.  Patient states she was clammy with the onset of her pain.  Past Medical History:  Diagnosis Date  . Hyperlipidemia   . Hypertension   . Myocardial infarction [facility](HCC)    2014    Patient Active Problem List   Diagnosis Date Noted  . Trimalleolar fracture of ankle, closed 02/28/2015  . NSTEMI (non-ST elevated myocardial infarction) (HCC) 10/14/2014    Past Surgical History:  Procedure Laterality Date  . CARDIAC CATHETERIZATION N/A 10/15/2014   Procedure: Left Heart Cath;  Surgeon: Dalia HeadingKenneth A Fath, MD;  Location: [facility] INVASIVE CV LAB;  Service: Cardiovascular;  Laterality: N/A;  . CARDIAC SURGERY  Cardiac stent to right coronary artery  . ORIF ANKLE FRACTURE Right 03/01/2015   Procedure: OPEN REDUCTION INTERNAL FIXATION (ORIF) ANKLE FRACTURE;  Surgeon: Kennedy BuckerMichael Menz, MD;  Location: [facility] ORS;  Service: Orthopedics;  Laterality: Right;  . STENT PLACEMENT VASCULAR ([facility] HX)    . TUBAL LIGATION  2004    Allergies Patient has no known allergies.  Social History Social History   Tobacco Use  . Smoking status: Current Every Day Smoker    Packs/day: 1.00    Types: Cigarettes  . Smokeless tobacco: Never Used  Substance Use Topics  . Alcohol use: Yes    Comment: occas  . Drug use: No   Review  of Systems Constitutional: Negative for fever. Cardiovascular: Positive for chest pain Respiratory: Negative for shortness of breath. Gastrointestinal: Negative for abdominal pain, vomiting and diarrhea. Musculoskeletal: Negative for back pain. Skin: Negative for rash. Neurological: Negative for headaches, focal weakness or numbness.  All systems negative/normal/unremarkable except as stated in the HPI  ____________________________________________   PHYSICAL EXAM:  VITAL SIGNS: ED Triage Vitals [10/04/18 2232]  Enc Vitals Group     BP      Pulse Rate (!) 112     Resp 18     Temp      Temp Source Oral     SpO2 98 %     Weight      Height      Head Circumference      Peak Flow      Pain Score 6     Pain Loc      Pain Edu?      Excl. in GC?    Constitutional: Alert and oriented. Well appearing and in no distress. Eyes: Conjunctivae are normal. Normal extraocular movements. ENT      Head: Normocephalic and atraumatic.      Nose: No congestion/rhinnorhea.      Mouth/Throat: Mucous membranes are moist.      Neck: No stridor. Cardiovascular: Normal rate, regular rhythm. No murmurs, rubs, or gallops. Respiratory: Normal respiratory effort without tachypnea nor retractions. Breath sounds are clear and equal bilaterally. No wheezes/rales/rhonchi. Gastrointestinal:  Soft and nontender. Normal bowel sounds Musculoskeletal: Nontender with normal range of motion in extremities. No lower extremity tenderness nor edema. Neurologic:  Normal speech and language. No gross focal neurologic deficits are appreciated.  Skin:  Skin is warm, dry and intact.  Positive for diaphoresis Psychiatric: Mood and affect are normal. Speech and behavior are normal.  ____________________________________________  EKG: Interpreted by me.  Sinus tachycardia with a rate of 114 bpm, repolarization abnormality, normal axis, normal QT, significant ST segment changes are  noted  ____________________________________________  ED COURSE:  As part of my medical decision making, I reviewed the following data within the electronic MEDICAL RECORD NUMBER History obtained from family if available, nursing notes, old chart and ekg, as well as notes from prior ED visits. Patient presented for chest tightness, we will assess with labs and imaging as indicated at this time.   Procedures  [name] was evaluated in Emergency Department on 10/04/2018 for the symptoms described in the history of present illness. She was evaluated in the context of the global COVID-19 pandemic, which necessitated consideration that the patient might be at risk for infection with the SARS-CoV-2 virus that causes COVID-19. Institutional protocols and algorithms that pertain to the evaluation of patients at risk for COVID-19 are in a state of rapid change based on information released by regulatory bodies including the CDC and federal and state organizations. These policies and algorithms were followed during the patient's care in the ED.  ____________________________________________   LABS (pertinent positives/negatives)  Labs Reviewed  CBC - Abnormal; Notable for the following components:      Result Value   WBC 11.2 (*)    All other components within normal limits  TROPONIN I - Abnormal; Notable for the following components:   Troponin I 2.08 (*)    All other components within normal limits  COMPREHENSIVE METABOLIC PANEL - Abnormal; Notable for the following components:   Potassium 3.1 (*)    Glucose, Bld 105 (*)    All other components within normal limits  SARS CORONAVIRUS 2 (HOSPITAL ORDER, PERFORMED IN [place] [facility])  PROTIME-INR  APTT  URINE DRUG SCREEN, QUALITATIVE ([facility] ONLY)    RADIOLOGY Images were viewed by me  Chest x-ray Does not reveal any acute process ____________________________________________  CRITICAL CARE Performed by: Ulice Dash   Total critical care time: 30 minutes  Critical care time was exclusive of separately billable procedures and treating other patients.  Critical care was necessary to treat or prevent imminent or life-threatening deterioration.  Critical care was time spent personally by me on the following activities: development of treatment plan with patient and/or surrogate as well as nursing, discussions with consultants, evaluation of patient's response to treatment, examination of patient, obtaining history from patient or surrogate, ordering and performing treatments and interventions, ordering and review of laboratory studies, ordering and review of radiographic studies, pulse oximetry and re-evaluation of patient's condition.    DIFFERENTIAL DIAGNOSIS   Unstable angina, MI, hypertensive urgency, hypertensive emergency, PE, musculoskeletal pain, medication noncompliance  FINAL ASSESSMENT AND PLAN  Chest pain, hypertension, elevated troponin   Plan: The patient had presented for chest pain and was brought in as a STEMI.  Patient's labs do indicate some evidence of myocardial ischemia as evidenced by the elevated troponin.  Patient's imaging is unremarkable.  Patient was given a bolus of heparin as well as labetalol and an additional dose of nitroglycerin.  I have been discussing the patient with Dr. Kirke Corin from  cardiology.  Plan will be for emergent cardiac catheterization.   Ulice Dash, MD    Note: This note was generated in part or whole with voice recognition software. Voice recognition is usually quite accurate but there are transcription errors that can and very often do occur. I apologize for any typographical errors that were not detected and corrected.     Emily Filbert, MD 10/04/18 2307    Emily Filbert, MD 10/04/18 [phone]

## 2018-10-04 NOTE — ED Triage Notes (Signed)
Pt from work with STEMI. md at bedside. Pt with onset of central chest pain approx one hour ago. Ems gave 2 0.4mg  ntg sprays and 324mg  asa. Pt states pain is now 6/10.

## 2018-10-04 NOTE — ED Notes (Signed)
Cardiologist here.  

## 2018-10-05 ENCOUNTER — Inpatient Hospital Stay (HOSPITAL_COMMUNITY)
Admit: 2018-10-05 | Discharge: 2018-10-05 | Disposition: A | Discharge status: Home / Self Care | Payer: Medicaid Other | Attending: Cardiovascular Disease | Admitting: Cardiovascular Disease

## 2018-10-05 DIAGNOSIS — I10 Essential (primary) hypertension: Secondary | ICD-10-CM

## 2018-10-05 DIAGNOSIS — I340 Nonrheumatic mitral (valve) insufficiency: Secondary | ICD-10-CM

## 2018-10-05 DIAGNOSIS — I2119 ST elevation (STEMI) myocardial infarction involving other coronary artery of inferior wall: Principal | ICD-10-CM

## 2018-10-05 DIAGNOSIS — I214 Non-ST elevation (NSTEMI) myocardial infarction: Secondary | ICD-10-CM

## 2018-10-05 DIAGNOSIS — E876 Hypokalemia: Secondary | ICD-10-CM | POA: Diagnosis present

## 2018-10-05 DIAGNOSIS — I2510 Atherosclerotic heart disease of native coronary artery without angina pectoris: Secondary | ICD-10-CM

## 2018-10-05 DIAGNOSIS — Z79899 Other long term (current) drug therapy: Secondary | ICD-10-CM | POA: Diagnosis not present

## 2018-10-05 DIAGNOSIS — E785 Hyperlipidemia, unspecified: Secondary | ICD-10-CM | POA: Diagnosis present

## 2018-10-05 DIAGNOSIS — Z20828 Contact with and (suspected) exposure to other viral communicable diseases: Secondary | ICD-10-CM | POA: Diagnosis present

## 2018-10-05 DIAGNOSIS — Z7982 Long term (current) use of aspirin: Secondary | ICD-10-CM | POA: Diagnosis not present

## 2018-10-05 DIAGNOSIS — I119 Hypertensive heart disease without heart failure: Secondary | ICD-10-CM | POA: Diagnosis present

## 2018-10-05 DIAGNOSIS — I200 Unstable angina: Secondary | ICD-10-CM | POA: Diagnosis present

## 2018-10-05 DIAGNOSIS — F17210 Nicotine dependence, cigarettes, uncomplicated: Secondary | ICD-10-CM | POA: Diagnosis present

## 2018-10-05 DIAGNOSIS — I252 Old myocardial infarction: Secondary | ICD-10-CM | POA: Diagnosis not present

## 2018-10-05 DIAGNOSIS — I2111 ST elevation (STEMI) myocardial infarction involving right coronary artery: Secondary | ICD-10-CM

## 2018-10-05 DIAGNOSIS — Z7902 Long term (current) use of antithrombotics/antiplatelets: Secondary | ICD-10-CM | POA: Diagnosis not present

## 2018-10-05 DIAGNOSIS — I160 Hypertensive urgency: Secondary | ICD-10-CM | POA: Diagnosis present

## 2018-10-05 DIAGNOSIS — I25110 Atherosclerotic heart disease of native coronary artery with unstable angina pectoris: Secondary | ICD-10-CM | POA: Diagnosis present

## 2018-10-05 DIAGNOSIS — Z833 Family history of diabetes mellitus: Secondary | ICD-10-CM | POA: Diagnosis not present

## 2018-10-05 DIAGNOSIS — Z9114 Patient's other noncompliance with medication regimen: Secondary | ICD-10-CM | POA: Diagnosis not present

## 2018-10-05 DIAGNOSIS — J449 Chronic obstructive pulmonary disease, unspecified: Secondary | ICD-10-CM | POA: Diagnosis present

## 2018-10-05 DIAGNOSIS — F1210 Cannabis abuse, uncomplicated: Secondary | ICD-10-CM | POA: Diagnosis present

## 2018-10-05 DIAGNOSIS — Z8249 Family history of ischemic heart disease and other diseases of the circulatory system: Secondary | ICD-10-CM | POA: Diagnosis not present

## 2018-10-05 LAB — BASIC METABOLIC PANEL
Anion gap: 10 (ref 5–15)
BUN: 15 mg/dL (ref 6–20)
CO2: 23 mmol/L (ref 22–32)
Calcium: 9.1 mg/dL (ref 8.9–10.3)
Chloride: 105 mmol/L (ref 98–111)
Creatinine, Ser: 0.67 mg/dL (ref 0.44–1.00)
GFR calc Af Amer: >60 mL/min (ref >60)
GFR calc non Af Amer: >60 mL/min (ref >60)
Glucose, Bld: 123 mg/dL — ABNORMAL HIGH (ref 70–99)
Potassium: 2.9 mmol/L — ABNORMAL LOW (ref 3.5–5.1)
Sodium: 138 mmol/L (ref 135–145)

## 2018-10-05 LAB — CBC
HCT: 39.7 % (ref 36.0–46.0)
Hemoglobin: 13.6 g/dL (ref 12.0–15.0)
MCH: 31.6 pg (ref 26.0–34.0)
MCHC: 34.3 g/dL (ref 30.0–36.0)
MCV: 92.3 fL (ref 80.0–100.0)
Platelets: 233 10*3/uL (ref 150–400)
RBC: 4.3 MIL/uL (ref 3.87–5.11)
RDW: 13.2 % (ref 11.5–15.5)
WBC: 8.7 10*3/uL (ref 4.0–10.5)
nRBC: 0 % (ref 0.0–0.2)

## 2018-10-05 LAB — URINE DRUG SCREEN, QUALITATIVE (ARMC ONLY)
Amphetamines, Ur Screen: NOT DETECTED
Barbiturates, Ur Screen: NOT DETECTED
Benzodiazepine, Ur Scrn: NOT DETECTED
Cannabinoid 50 Ng, Ur ~~LOC~~: POSITIVE — AB
Cocaine Metabolite,Ur ~~LOC~~: NOT DETECTED
MDMA (Ecstasy)Ur Screen: NOT DETECTED
Methadone Scn, Ur: NOT DETECTED
Opiate, Ur Screen: NOT DETECTED
Phencyclidine (PCP) Ur S: NOT DETECTED
Tricyclic, Ur Screen: NOT DETECTED

## 2018-10-05 LAB — MAGNESIUM: Magnesium: 1.7 mg/dL (ref 1.7–2.4)

## 2018-10-05 LAB — TROPONIN I: Troponin I: 3.35 ng/mL (ref <0.03)

## 2018-10-05 LAB — POTASSIUM: Potassium: 3.9 mmol/L (ref 3.5–5.1)

## 2018-10-05 LAB — GLUCOSE, CAPILLARY: Glucose-Capillary: 86 mg/dL (ref 70–99)

## 2018-10-05 LAB — PHOSPHORUS: Phosphorus: 2.7 mg/dL (ref 2.5–4.6)

## 2018-10-05 LAB — POCT ACTIVATED CLOTTING TIME: Activated Clotting Time: 246 seconds

## 2018-10-05 LAB — MRSA PCR SCREENING: MRSA by PCR: NEGATIVE

## 2018-10-05 MED ORDER — LABETALOL HCL 5 MG/ML IV SOLN
10.0000 mg | INTRAVENOUS | Status: AC | PRN
  Administered 2018-10-05: 10 mg via INTRAVENOUS
  Filled 2018-10-05: qty 4

## 2018-10-05 MED ORDER — SODIUM CHLORIDE 0.9% FLUSH
3.0000 mL | Freq: Two times a day (BID) | INTRAVENOUS | Status: DC
  Administered 2018-10-05 – 2018-10-06 (×2): 3 mL via INTRAVENOUS

## 2018-10-05 MED ORDER — POTASSIUM CHLORIDE CRYS ER 20 MEQ PO TBCR
40.0000 meq | EXTENDED_RELEASE_TABLET | Freq: Once | ORAL | Status: AC
  Administered 2018-10-05: 40 meq via ORAL
  Filled 2018-10-05: qty 2

## 2018-10-05 MED ORDER — ASPIRIN EC 81 MG PO TBEC
81.0000 mg | DELAYED_RELEASE_TABLET | Freq: Every day | ORAL | Status: DC
  Administered 2018-10-05 – 2018-10-08 (×4): 81 mg via ORAL
  Filled 2018-10-05 (×4): qty 1

## 2018-10-05 MED ORDER — POTASSIUM CHLORIDE 20 MEQ PO PACK
40.0000 meq | PACK | Freq: Once | ORAL | Status: AC
  Administered 2018-10-05: 40 meq via ORAL
  Filled 2018-10-05: qty 2

## 2018-10-05 MED ORDER — TICAGRELOR 90 MG PO TABS
90.0000 mg | ORAL_TABLET | Freq: Two times a day (BID) | ORAL | Status: DC
  Administered 2018-10-05 – 2018-10-08 (×7): 90 mg via ORAL
  Filled 2018-10-05 (×7): qty 1

## 2018-10-05 MED ORDER — ALBUTEROL SULFATE (2.5 MG/3ML) 0.083% IN NEBU: 2.5000 mg | INHALATION_SOLUTION | Freq: Four times a day (QID) | RESPIRATORY_TRACT | Status: DC

## 2018-10-05 MED ORDER — TICAGRELOR 90 MG PO TABS
ORAL_TABLET | ORAL | Status: AC
  Filled 2018-10-05: qty 2

## 2018-10-05 MED ORDER — SODIUM CHLORIDE 0.9% FLUSH: 3.0000 mL | INTRAVENOUS | Status: DC | PRN

## 2018-10-05 MED ORDER — TICAGRELOR 90 MG PO TABS
ORAL_TABLET | ORAL | Status: DC | PRN
  Administered 2018-10-05: 180 mg via ORAL

## 2018-10-05 MED ORDER — ACETAMINOPHEN 325 MG PO TABS: 650.0000 mg | ORAL_TABLET | ORAL | Status: DC | PRN

## 2018-10-05 MED ORDER — TRAZODONE HCL 50 MG PO TABS
50.0000 mg | ORAL_TABLET | Freq: Every evening | ORAL | Status: DC | PRN
  Administered 2018-10-05 – 2018-10-07 (×4): 50 mg via ORAL
  Filled 2018-10-05 (×4): qty 1

## 2018-10-05 MED ORDER — HEPARIN SODIUM (PORCINE) 1000 UNIT/ML IJ SOLN
INTRAMUSCULAR | Status: DC | PRN
  Administered 2018-10-05: 4000 [IU] via INTRAVENOUS
  Administered 2018-10-05: 2000 [IU] via INTRAVENOUS

## 2018-10-05 MED ORDER — BENAZEPRIL HCL 20 MG PO TABS
20.0000 mg | ORAL_TABLET | Freq: Every day | ORAL | Status: DC
  Administered 2018-10-05 – 2018-10-08 (×4): 20 mg via ORAL
  Filled 2018-10-05 (×4): qty 1

## 2018-10-05 MED ORDER — AMLODIPINE BESYLATE 5 MG PO TABS
5.0000 mg | ORAL_TABLET | Freq: Every day | ORAL | Status: DC
  Administered 2018-10-05 – 2018-10-08 (×4): 5 mg via ORAL
  Filled 2018-10-05 (×4): qty 1

## 2018-10-05 MED ORDER — ATORVASTATIN CALCIUM 20 MG PO TABS
80.0000 mg | ORAL_TABLET | Freq: Every day | ORAL | Status: DC
  Administered 2018-10-05 – 2018-10-07 (×4): 80 mg via ORAL
  Filled 2018-10-05 (×5): qty 4

## 2018-10-05 MED ORDER — SODIUM CHLORIDE 0.9 % IV SOLN
INTRAVENOUS | Status: AC
  Administered 2018-10-05: 01:00:00 via INTRAVENOUS

## 2018-10-05 MED ORDER — LABETALOL HCL 5 MG/ML IV SOLN
INTRAVENOUS | Status: DC | PRN
  Administered 2018-10-05 (×2): 20 mg via INTRAVENOUS

## 2018-10-05 MED ORDER — ALBUTEROL SULFATE (2.5 MG/3ML) 0.083% IN NEBU: 2.5000 mg | INHALATION_SOLUTION | Freq: Four times a day (QID) | RESPIRATORY_TRACT | Status: DC | PRN

## 2018-10-05 MED ORDER — HYDROCHLOROTHIAZIDE 25 MG PO TABS
25.0000 mg | ORAL_TABLET | Freq: Every day | ORAL | Status: DC
  Administered 2018-10-05 – 2018-10-08 (×4): 25 mg via ORAL
  Filled 2018-10-05 (×4): qty 1

## 2018-10-05 MED ORDER — IOHEXOL 300 MG/ML  SOLN
INTRAMUSCULAR | Status: DC | PRN
  Administered 2018-10-05: 130 mL

## 2018-10-05 MED ORDER — MAGNESIUM SULFATE IN D5W 1-5 GM/100ML-% IV SOLN
1.0000 g | Freq: Once | INTRAVENOUS | Status: AC
  Administered 2018-10-05: 1 g via INTRAVENOUS
  Filled 2018-10-05: qty 100

## 2018-10-05 MED ORDER — LABETALOL HCL 5 MG/ML IV SOLN: 10.0000 mg | INTRAVENOUS | Status: DC | PRN

## 2018-10-05 MED ORDER — SODIUM CHLORIDE 0.9 % IV SOLN: 250.0000 mL | INTRAVENOUS | Status: DC | PRN

## 2018-10-05 MED ORDER — HEPARIN (PORCINE) IN NACL 1000-0.9 UT/500ML-% IV SOLN
INTRAVENOUS | Status: DC | PRN
  Administered 2018-10-05: 1000 mL

## 2018-10-05 MED ORDER — NITROGLYCERIN 1 MG/10 ML FOR IR/CATH LAB
INTRA_ARTERIAL | Status: DC | PRN
  Administered 2018-10-05 (×2): 200 ug via INTRACORONARY

## 2018-10-05 MED ORDER — CARVEDILOL 6.25 MG PO TABS
6.2500 mg | ORAL_TABLET | Freq: Two times a day (BID) | ORAL | Status: DC
  Administered 2018-10-05 – 2018-10-08 (×6): 6.25 mg via ORAL
  Filled 2018-10-05 (×7): qty 1

## 2018-10-05 MED ORDER — ONDANSETRON HCL 4 MG/2ML IJ SOLN: 4.0000 mg | Freq: Four times a day (QID) | INTRAMUSCULAR | Status: DC | PRN

## 2018-10-05 MED ORDER — ENOXAPARIN SODIUM 40 MG/0.4ML ~~LOC~~ SOLN
40.0000 mg | SUBCUTANEOUS | Status: DC
  Filled 2018-10-05: qty 0.4

## 2018-10-05 MED ORDER — LABETALOL HCL 5 MG/ML IV SOLN
INTRAVENOUS | Status: AC
  Filled 2018-10-05: qty 4

## 2018-10-05 MED ORDER — POTASSIUM CHLORIDE 10 MEQ/100ML IV SOLN
10.0000 meq | INTRAVENOUS | Status: AC
  Administered 2018-10-05 (×4): 10 meq via INTRAVENOUS
  Filled 2018-10-05 (×4): qty 100

## 2018-10-05 NOTE — H&P (Addendum)
[facility] [place] at [facility]   PATIENT NAME: [name]    MR#:  [id number]  DATE OF BIRTH:  1981/05/16  DATE OF ADMISSION:  10/04/2018  PRIMARY CARE PHYSICIAN: Titus Mould, NP   REQUESTING/REFERRING PHYSICIAN: Lorine Bears, MD  CHIEF COMPLAINT:   Chief Complaint  Patient presents with   Code STEMI  Chest pain  HISTORY OF PRESENT ILLNESS:  [name]  is a 38 y.o. pleasant African-American female with a known history of multiple problems including coronary artery disease status post PCI and 2 RCA stents, presented to the emergency room with acute onset of lower sternal chest tightness with associated dyspnea and right upper extremity numbness.  She felt clammy without diaphoresis.  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  She denied any palpitations.  No cough or wheezing or hemoptysis.  She admitted to mild dizziness without headache or blurred vision or diplopia.  No paresthesias or focal muscle weakness.  She denies any fever or chills.  No bleeding diathesis.  No recent sick exposure or exposure to large crowds.  No recent travels out of the state or out of the country or to a COVID-19 epicenter.  Upon presentation to the emergency room, her temperature was 98.3 and pulse 112 respiratory to 18 and pulse ox entry 90% on room air with a blood pressure of 208/113.  Her labs are remarkable for hypokalemia of 3.1 and her troponin I came back 2.08 WBC showing minimal leukocytosis of 11.2.  Coagulation profile was within normal.  COVID-19 test came back negative.  Portable chest ray showed no acute cardiopulmonary disease and did show her coronary stents.  EKG showed suspected inferior ST segment elevation.  Code STEMI was called and Dr. Kirke Corin was notified.  Patient was taken to the Cath Lab and underwent cardiac catheterization that showed 95% distal RCA stenosis that was likely the culprit for her  pain which was successfully treated with PCI and drug-eluting stent placement.  There was residual 70% stenosis in the mid RCA before her previously placed stents that was left to be medically managed.  There was also a 90% stenosis in the left circumflex for which a staged PCI on Monday or Tuesday was recommended.  Her Plavix was switched to Brilinta and aspirin was continued.  She was placed on high-dose statin.  She was admitted to an ICU bed for further evaluation and management. PAST MEDICAL HISTORY:   Past Medical History:  Diagnosis Date   Hyperlipidemia    Hypertension    Myocardial infarction [facility])    2014    PAST SURGICAL HISTORY:   Past Surgical History:  Procedure Laterality Date   CARDIAC CATHETERIZATION N/A 10/15/2014   Procedure: Left Heart Cath;  Surgeon: Dalia Heading, MD;  Location: [facility] INVASIVE CV LAB;  Service: Cardiovascular;  Laterality: N/A;   CARDIAC SURGERY  Cardiac stent to right coronary artery   ORIF ANKLE FRACTURE Right 03/01/2015   Procedure: OPEN REDUCTION INTERNAL FIXATION (ORIF) ANKLE FRACTURE;  Surgeon: Kennedy Bucker, MD;  Location: [facility] ORS;  Service: Orthopedics;  Laterality: Right;   STENT PLACEMENT VASCULAR ([facility] HX)     TUBAL LIGATION  2004    SOCIAL HISTORY:   Social History   Tobacco Use   Smoking status: Current Every Day Smoker    Packs/day: 1.00    Types: Cigarettes   Smokeless tobacco: Never Used  Substance Use Topics  Alcohol use: Yes    Comment: occas    FAMILY HISTORY:   Family History  Problem Relation Age of Onset   Diabetes Mother    Hypertension Mother    Diabetes Father    Hypertension Father    Heart attack Sister     DRUG ALLERGIES:  No Known Allergies  REVIEW OF SYSTEMS:   ROS As per history of present illness. All pertinent systems were reviewed above. Constitutional,  HEENT, cardiovascular, respiratory, GI, GU, musculoskeletal, neuro, psychiatric, endocrine,  integumentary and  hematologic systems were reviewed and are otherwise  negative/unremarkable except for positive findings mentioned above in the HPI.   MEDICATIONS AT HOME:   Prior to Admission medications   Medication Sig Start Date End Date Taking? Authorizing Provider  Albuterol Sulfate (PROAIR RESPICLICK) 108 (90 Base) MCG/ACT AEPB Inhale 2 puffs into the lungs every 6 (six) hours. 06/03/18   Fisher, Roselyn Bering, PA-C  amLODipine (NORVASC) 5 MG tablet Take 1 tablet (5 mg total) by mouth daily. 06/03/18   Fisher, Roselyn Bering, PA-C  aspirin EC 81 MG tablet Take 1 tablet (81 mg total) by mouth daily. 10/16/14   Enid Baas, MD  atorvastatin (LIPITOR) 80 MG tablet Take 1 tablet (80 mg total) by mouth daily. 06/03/18   Fisher, Roselyn Bering, PA-C  benazepril (LOTENSIN) 20 MG tablet Take 1 tablet (20 mg total) by mouth daily. 06/03/18   Fisher, Roselyn Bering, PA-C  clopidogrel (PLAVIX) 75 MG tablet Take 1 tablet (75 mg total) by mouth daily. 10/16/14   Enid Baas, MD  hydrochlorothiazide (HYDRODIURIL) 25 MG tablet Take 1 tablet (25 mg total) by mouth daily. 06/03/18   Fisher, Roselyn Bering, PA-C  metoprolol tartrate (LOPRESSOR) 25 MG tablet Take 0.5 tablets (12.5 mg total) by mouth 2 (two) times daily. 06/03/18 06/03/19  Fisher, Roselyn Bering, PA-C  nitroGLYCERIN (NITROSTAT) 0.4 MG SL tablet Place 1 tablet (0.4 mg total) under the tongue every 5 (five) minutes as needed. 10/16/14 10/16/15  Enid Baas, MD  traZODone (DESYREL) 50 MG tablet Take 1 tablet (50 mg total) by mouth at bedtime as needed. 10/16/14   Enid Baas, MD      VITAL SIGNS:  Blood pressure (!) 156/92, pulse 92, temperature 98.3 F (36.8 C), temperature source Oral, resp. rate (!) 28, height 5' (1.524 m), weight 76.3 kg, last menstrual period 09/25/2018, SpO2 100 %.  PHYSICAL EXAMINATION:  Physical Exam  GENERAL:  38 y.o.-year-old pleasant African-American female patient lying in the bed with no acute distress.  EYES: Pupils equal, round, reactive to light and  accommodation. No scleral icterus. Extraocular muscles intact.  HEENT: Head atraumatic, normocephalic. Oropharynx and nasopharynx clear.  NECK:  Supple, no jugular venous distention. No thyroid enlargement, no tenderness.  LUNGS: Normal breath sounds bilaterally, no wheezing, rales,rhonchi or crepitation. No use of accessory muscles of respiration.  CARDIOVASCULAR: Regular rate and rhythm, S1, S2 normal. No murmurs, rubs, or gallops.  ABDOMEN: Soft, nondistended, nontender. Bowel sounds present. No organomegaly or mass.  EXTREMITIES: No pedal edema, cyanosis, or clubbing.  NEUROLOGIC: Cranial nerves II through XII are intact. Muscle strength 5/5 in all extremities. Sensation intact. Gait not checked.  PSYCHIATRIC: The patient is alert and oriented x 3.  Normal affect and good eye contact. SKIN: No obvious rash, lesion, or ulcer.   LABORATORY PANEL:   CBC Recent Labs  Lab 10/04/18 2236  WBC 11.2*  HGB 14.9  HCT 43.2  PLT 274   ------------------------------------------------------------------------------------------------------------------  Chemistries  Recent Labs  Lab  10/04/18 2236  NA 137  K 3.1*  CL 100  CO2 24  GLUCOSE 105*  BUN 18  CREATININE 0.96  CALCIUM 9.2  AST 26  ALT 11  ALKPHOS 60  BILITOT 0.8   ------------------------------------------------------------------------------------------------------------------  Cardiac Enzymes Recent Labs  Lab 10/04/18 2236  TROPONINI 2.08*   ------------------------------------------------------------------------------------------------------------------  RADIOLOGY:  Dg Chest Port 1 View  Result Date: 10/04/2018 CLINICAL DATA:  38 year old female with chest pain. EXAM: PORTABLE CHEST 1 VIEW COMPARISON:  Chest radiograph dated 06/03/2018 FINDINGS: There is stable mild cardiomegaly. Coronary vascular stents noted. There is no focal consolidation, pleural effusion, pneumothorax. No acute osseous pathology. IMPRESSION: No  active disease. Electronically Signed   By: Elgie CollardArash  Radparvar M.D.   On: 10/04/2018 23:05      IMPRESSION AND PLAN:   #1.  Acute inferior wall STEMI with history of coronary artery disease.  The patient is status post PCI and RCA DES placement.  The patient was admitted to an ICU bed.  She will be continued on dual antiplatelet therapy with aspirin and Brilinta.  She will be placed on high-dose statin therapy.  We will continue beta-blocker therapy and PRN sublingual nitroglycerin.  Dr. Kirke CorinArida will be following.  2.  Hypertensive urgency.  She will be continued on her antihypertensives including amlodipine, benazepril, HCTZ and Coreg as well as placed on PRN IV labetalol.  3.  Dyslipidemia.  High-dose statin therapy will be resumed.  Target LDL is less than 70.  4.  COPD.  She will be placed on her albuterol neb.  5.  Tobacco abuse.  She received counseling for smoking cessation  6.  DVT prophylaxis.  Subcutaneous Lovenox.  All the records are reviewed and case discussed with ED provider. The plan of care was discussed in details with the patient.  I answered all questions. The patient agreed to proceed with the above mentioned plan. Further management will depend upon hospital course.   CODE STATUS: Full code  TOTAL TIME TAKING CARE OF THIS PATIENT: 50 minutes.    Hannah BeatJan A Janayah Zavada M.D on 10/05/2018 at 2:14 AM  Pager - [phone]  After 6pm go to [email or web address] - Scientist, research (life sciences)password EPAS [facility]  [facility] [place] [facility]  Office  [phone]  CC: Primary care physician; White, Arlyss RepressElizabeth Burney, NP   Note: This dictation was prepared with Dragon dictation along with smaller phrase technology. Any transcriptional errors that result from this process are unintentional.

## 2018-10-05 NOTE — Consult Note (Signed)
Cardiology Consultation:   Patient ID: [name] Bigos MRN: [id number]; DOB: 05/23/1981  Admit date: 10/04/2018 Date of Consult: 10/05/2018  Primary Care Provider: Titus MouldWhite, Elizabeth Burney, NP Primary Cardiologist: new Primary Electrophysiologist:  None    Patient Profile:   [name] Frid is a 38 y.o. female with a hx of coronary artery disease status post RCA stents x2 and uncontrolled hypertension who is being seen today for the evaluation of chest pain and possible inferior ST elevation myocardial infarction at the request of Dr. Mayford KnifeWilliams.  History of Present Illness:   Ms. Wonda OldsCousin is a 38 year old African-American female with known history of coronary artery disease.  She had previous myocardial infarction in 2013 and was treated with drug-eluting stent placement in the mid to distal RCA.  She had another non-STEMI in 2014 and was found to have severe distal edge in-stent restenosis that was treated successfully with placement of another drug-eluting stent.  The patient is known to have uncontrolled hypertension and intermittent noncompliance with medications.  She currently does not have a cardiologist and was most recently seen by Dr. Gwen PoundsKowalski in 2017.  She was [facility] but she did not follow-up. She presented via EMS due to substernal chest pain and tightness that started 1 hour before presentation.  Her systolic blood pressure was 240 on presentation and EKG showed evidence of prior inferior infarct which was present on previous EKG but this time there was borderline inferior ST elevation.  I elected to delay activation of Cath Lab given that her EKG changes were borderline and her systolic blood pressure was extremely elevated.  The plan was to control her blood pressure then heparinized her and evaluate her response.  In spite of doing so, she continued to have chest pain and thus I recommended proceeding with urgent cardiac catheterization.  Past Medical History:   Diagnosis Date  . Hyperlipidemia   . Hypertension   . Myocardial infarction [facility])    2014    Past Surgical History:  Procedure Laterality Date  . CARDIAC CATHETERIZATION N/A 10/15/2014   Procedure: Left Heart Cath;  Surgeon: Dalia HeadingKenneth A Fath, MD;  Location: [facility] INVASIVE CV LAB;  Service: Cardiovascular;  Laterality: N/A;  . CARDIAC SURGERY  Cardiac stent to right coronary artery  . ORIF ANKLE FRACTURE Right 03/01/2015   Procedure: OPEN REDUCTION INTERNAL FIXATION (ORIF) ANKLE FRACTURE;  Surgeon: Kennedy BuckerMichael Menz, MD;  Location: [facility] ORS;  Service: Orthopedics;  Laterality: Right;  . STENT PLACEMENT VASCULAR ([facility] HX)    . TUBAL LIGATION  2004     Home Medications:  Prior to Admission medications   Medication Sig Start Date End Date Taking? Authorizing Provider  Albuterol Sulfate (PROAIR RESPICLICK) 108 (90 Base) MCG/ACT AEPB Inhale 2 puffs into the lungs every 6 (six) hours. 06/03/18   Fisher, Roselyn BeringSusan W, PA-C  amLODipine (NORVASC) 5 MG tablet Take 1 tablet (5 mg total) by mouth daily. 06/03/18   Fisher, Roselyn BeringSusan W, PA-C  aspirin EC 81 MG tablet Take 1 tablet (81 mg total) by mouth daily. 10/16/14   Enid BaasKalisetti, Radhika, MD  atorvastatin (LIPITOR) 80 MG tablet Take 1 tablet (80 mg total) by mouth daily. 06/03/18   Fisher, Roselyn BeringSusan W, PA-C  benazepril (LOTENSIN) 20 MG tablet Take 1 tablet (20 mg total) by mouth daily. 06/03/18   Fisher, Roselyn BeringSusan W, PA-C  clopidogrel (PLAVIX) 75 MG tablet Take 1 tablet (75 mg total) by mouth daily. 10/16/14   Enid BaasKalisetti, Radhika, MD  hydrochlorothiazide (HYDRODIURIL) 25 MG tablet  Take 1 tablet (25 mg total) by mouth daily. 06/03/18   Fisher, Roselyn Bering, PA-C  metoprolol tartrate (LOPRESSOR) 25 MG tablet Take 0.5 tablets (12.5 mg total) by mouth 2 (two) times daily. 06/03/18 06/03/19  Fisher, Roselyn Bering, PA-C  nitroGLYCERIN (NITROSTAT) 0.4 MG SL tablet Place 1 tablet (0.4 mg total) under the tongue every 5 (five) minutes as needed. 10/16/14 10/16/15  Enid Baas, MD  traZODone (DESYREL) 50 MG  tablet Take 1 tablet (50 mg total) by mouth at bedtime as needed. 10/16/14   Enid Baas, MD    Inpatient Medications: Scheduled Meds: . Albuterol Sulfate  2 puff Inhalation Q6H  . amLODipine  5 mg Oral Daily  . aspirin EC  81 mg Oral Daily  . atorvastatin  80 mg Oral Daily  . benazepril  20 mg Oral Daily  . carvedilol  6.25 mg Oral BID WC  . [START ON 10/06/2018] enoxaparin (LOVENOX) injection  40 mg Subcutaneous Q24H  . hydrochlorothiazide  25 mg Oral Daily  . sodium chloride flush  3 mL Intravenous Q12H  . ticagrelor  90 mg Oral BID   Continuous Infusions: . sodium chloride    . sodium chloride     PRN Meds: sodium chloride, acetaminophen, labetalol, nitroGLYCERIN, ondansetron (ZOFRAN) IV, sodium chloride flush, traZODone  Allergies:   No Known Allergies  Social History:   Social History   Socioeconomic History  . Marital status: Married    Spouse name: Not on file  . Number of children: Not on file  . Years of education: Not on file  . Highest education level: Not on file  Occupational History  . Not on file  Social Needs  . Financial resource strain: Not on file  . Food insecurity:    Worry: Not on file    Inability: Not on file  . Transportation needs:    Medical: Not on file    Non-medical: Not on file  Tobacco Use  . Smoking status: Current Every Day Smoker    Packs/day: 1.00    Types: Cigarettes  . Smokeless tobacco: Never Used  Substance and Sexual Activity  . Alcohol use: Yes    Comment: occas  . Drug use: No  . Sexual activity: Not on file  Lifestyle  . Physical activity:    Days per week: Not on file    Minutes per session: Not on file  . Stress: Not on file  Relationships  . Social connections:    Talks on phone: Not on file    Gets together: Not on file    Attends religious service: Not on file    Active member of club or organization: Not on file    Attends meetings of clubs or organizations: Not on file    Relationship status:  Not on file  . Intimate partner violence:    Fear of current or ex partner: Not on file    Emotionally abused: Not on file    Physically abused: Not on file    Forced sexual activity: Not on file  Other Topics Concern  . Not on file  Social History Narrative  . Not on file    Family History:    Family History  Problem Relation Age of Onset  . Diabetes Mother   . Hypertension Mother   . Diabetes Father   . Hypertension Father   . Heart attack Sister      ROS:  Please see the history of present illness.   All other  ROS reviewed and negative.     Physical Exam/Data:   Vitals:   10/04/18 2310 10/04/18 2315 10/04/18 2330 10/05/18 0106  BP: (!) 173/108  (!) 169/107 (!) (P) 144/97  Pulse: 97 98 96   Resp: 18 (!) 27 18   Temp:      TempSrc:      SpO2: 98% 99% 96%   Weight:       No intake or output data in the 24 hours ending 10/05/18 0108 Last 3 Weights 10/04/2018 06/03/2018 10/08/2016  Weight (lbs) 172 lb 170 lb 170 lb  Weight (kg) 78.019 kg 77.111 kg 77.111 kg     Body mass index is 33.59 kg/m.  General:  Well nourished, well developed, in no acute distress HEENT: normal Lymph: no adenopathy Neck: no JVD Endocrine:  No thryomegaly Vascular: No carotid bruits; FA pulses 2+ bilaterally without bruits  Cardiac:  normal S1, S2; RRR; no murmur  Lungs:  clear to auscultation bilaterally, no wheezing, rhonchi or rales  Abd: soft, nontender, no hepatomegaly  Ext: no edema Musculoskeletal:  No deformities, BUE and BLE strength normal and equal Skin: warm and dry  Neuro:  CNs 2-12 intact, no focal abnormalities noted Psych:  Normal affect   EKG:  The EKG was personally reviewed and demonstrates: Sinus tachycardia with inferior Q waves with 1 mm of ST elevation in lead III and aVF with LVH with repolarization abnormalities Telemetry:  Telemetry was personally reviewed and demonstrates: Normal sinus rhythm  Relevant CV Studies: Pending  Laboratory Data:  Chemistry  Recent Labs  Lab 10/04/18 2236  NA 137  K 3.1*  CL 100  CO2 24  GLUCOSE 105*  BUN 18  CREATININE 0.96  CALCIUM 9.2  GFRNONAA >60  GFRAA >60  ANIONGAP 13    Recent Labs  Lab 10/04/18 2236  PROT 7.9  ALBUMIN 3.8  AST 26  ALT 11  ALKPHOS 60  BILITOT 0.8   Hematology Recent Labs  Lab 10/04/18 2236  WBC 11.2*  RBC 4.65  HGB 14.9  HCT 43.2  MCV 92.9  MCH 32.0  MCHC 34.5  RDW 13.2  PLT 274   Cardiac Enzymes Recent Labs  Lab 10/04/18 2236  TROPONINI 2.08*   No results for input(s): TROPIPOC in the last 168 hours.  BNPNo results for input(s): BNP, PROBNP in the last 168 hours.  DDimer No results for input(s): DDIMER in the last 168 hours.  Radiology/Studies:  Dg Chest Port 1 View  Result Date: 10/04/2018 CLINICAL DATA:  38 year old female with chest pain. EXAM: PORTABLE CHEST 1 VIEW COMPARISON:  Chest radiograph dated 06/03/2018 FINDINGS: There is stable mild cardiomegaly. Coronary vascular stents noted. There is no focal consolidation, pleural effusion, pneumothorax. No acute osseous pathology. IMPRESSION: No active disease. Electronically Signed   By: Elgie Collard M.D.   On: 10/04/2018 23:05    Assessment and Plan:   1. Acute inferior ST elevation myocardial infarction: Urgent cardiac catheterization was performed after giving antihypertensive medications.  Cardiac catheterization showed 95% distal RCA stenosis which was likely the culprit.  This was treated successfully with PCI and drug-eluting stent placement.  There was residual 70% stenosis in the mid RCA before previously placed stents and this was left to be treated medically.  In addition, there was 90% stenosis in the left circumflex for which I recommend staged PCI on Monday or Tuesday.  Left circumflex PCI might be somewhat difficult due to steep takeoff of the left circumflex from the left main.  I recommend continuing dual antiplatelet therapy.  The patient was switched from Plavix to Brilinta given  presentation with myocardial infarction.  Given multiple drug-eluting stents, I favor that she stays on long-term dual antiplatelet therapy.  She needs aggressive control of blood pressure and other risk factors. 2. Essential hypertension: The patient was extremely hypertensive on presentation and throughout cardiac catheterization.  She was given multiple doses of IV labetalol.  Going to switch her from metoprolol to carvedilol and continue other antihypertensive medications. 3. Hyperlipidemia: Continue high-dose atorvastatin with target LDL of less than 70. 4. Tobacco use: I discussed with her the importance of smoking cessation.   For questions or updates, please contact [facility] Please consult [email or web address] for contact info under     Signed, Lorine Bears, MD  10/05/2018 1:08 AM

## 2018-10-05 NOTE — Progress Notes (Signed)
Patient briefly seen this morning in ICU.  Patient presented with ST elevation MI status post PCI of RCA. Plan on stenting 90% left circumflex and flex lesion Monday or Tuesday. Patient denies chest pain. Agree with admitting MD plan.  Tobacco dependence: Patient is encouraged to quit smoking and willing to attempt to quit was assessed. Patient highly motivated.Counseling was provided for 4 minutes. Nicotine patch

## 2018-10-05 NOTE — Consult Note (Signed)
PULMONARY / CRITICAL CARE MEDICINE  Name: [name] MRN: [id number] DOB: Feb 17, 1981    LOS: 0  Referring Provider:  Dr Kirke Corin Reason for Referral:  STEMI  HPI: 38 year old African-American female with known history of CAD status post previous MI with PCI who presented to the ED with acute shortness of breath and left-sided chest pain.  EKG showed ST changes suggestive of acute MI.  She was taken emergently to the Cath Lab and a left heart catheterization was performed.  She was found to have severe two-vessel disease with a drug-eluting stent placed in the RCA.  She is pending PCI to the circumflex vessel.  She reports improvement in symptoms post PCI.  Past Medical History:  Diagnosis Date  . Hyperlipidemia   . Hypertension   . Myocardial infarction [facility])    2014   Past Surgical History:  Procedure Laterality Date  . CARDIAC CATHETERIZATION N/A 10/15/2014   Procedure: Left Heart Cath;  Surgeon: Dalia Heading, MD;  Location: [facility] INVASIVE CV LAB;  Service: Cardiovascular;  Laterality: N/A;  . CARDIAC SURGERY  Cardiac stent to right coronary artery  . ORIF ANKLE FRACTURE Right 03/01/2015   Procedure: OPEN REDUCTION INTERNAL FIXATION (ORIF) ANKLE FRACTURE;  Surgeon: Kennedy Bucker, MD;  Location: [facility] ORS;  Service: Orthopedics;  Laterality: Right;  . STENT PLACEMENT VASCULAR ([facility] HX)    . TUBAL LIGATION  2004   No current facility-administered medications on file prior to encounter.    Current Outpatient Medications on File Prior to Encounter  Medication Sig  . Albuterol Sulfate (PROAIR RESPICLICK) 108 (90 Base) MCG/ACT AEPB Inhale 2 puffs into the lungs every 6 (six) hours.  Marland Kitchen amLODipine (NORVASC) 5 MG tablet Take 1 tablet (5 mg total) by mouth daily.  Marland Kitchen aspirin EC 81 MG tablet Take 1 tablet (81 mg total) by mouth daily.  Marland Kitchen atorvastatin (LIPITOR) 80 MG tablet Take 1 tablet (80 mg total) by mouth daily.  . benazepril (LOTENSIN) 20 MG tablet Take 1 tablet (20 mg total) by mouth  daily.  . clopidogrel (PLAVIX) 75 MG tablet Take 1 tablet (75 mg total) by mouth daily.  . hydrochlorothiazide (HYDRODIURIL) 25 MG tablet Take 1 tablet (25 mg total) by mouth daily.  . metoprolol tartrate (LOPRESSOR) 25 MG tablet Take 0.5 tablets (12.5 mg total) by mouth 2 (two) times daily.  . nitroGLYCERIN (NITROSTAT) 0.4 MG SL tablet Place 1 tablet (0.4 mg total) under the tongue every 5 (five) minutes as needed.  . traZODone (DESYREL) 50 MG tablet Take 1 tablet (50 mg total) by mouth at bedtime as needed.    Allergies No Known Allergies  Family History Family History  Problem Relation Age of Onset  . Diabetes Mother   . Hypertension Mother   . Diabetes Father   . Hypertension Father   . Heart attack Sister    Social History  reports that she has been smoking cigarettes. She has been smoking about 1.00 pack per day. She has never used smokeless tobacco. She reports current alcohol use. She reports that she does not use drugs.  Review Of Systems:  Constitutional: Negative for fever and chills.  HENT: Negative for congestion and rhinorrhea.  Eyes: Negative for redness and visual disturbance.  Respiratory: Positive for shortness of breath but negative for wheezing.  Cardiovascular: Positive for chest pain and palpitations that resolved Gastrointestinal: Negative  for nausea , vomiting and abdominal pain and  Loose stools Genitourinary: Negative for dysuria and urgency.  Endocrine: Denies polyuria,  polyphagia and heat intolerance Musculoskeletal: Negative for myalgias and arthralgias.  Skin: Negative for pallor and wound.  Neurological: Negative for dizziness and headaches   VITAL SIGNS: BP (!) 156/92   Pulse 92   Temp 98.3 F (36.8 C) (Oral)   Resp (!) 28   Ht 5' (1.524 m)   Wt 76.3 kg   LMP 09/25/2018   SpO2 100%   BMI 32.85 kg/m   HEMODYNAMICS:    VENTILATOR SETTINGS:    INTAKE / OUTPUT: No intake/output data recorded.  PHYSICAL EXAMINATION: General:  Well-nourished, well-developed, in no acute distress HEENT: PERRLA, trachea midline, no JVD Neuro: Alert and oriented x4, no focal deficits Cardiovascular: Apical pulse regular, S1-S2, no murmur regurg or gallop, +2 pulses bilaterally, no edema Lungs: Clear to auscultation bilaterally Abdomen: Nondistended, positive bowel sounds in all 4 quadrants, palpation reveals no organomegaly Musculoskeletal: Positive range of motion, no joint deformities Skin: Warm and dry  LABS:  BMET Recent Labs  Lab 10/04/18 2236  NA 137  K 3.1*  CL 100  CO2 24  BUN 18  CREATININE 0.96  GLUCOSE 105*    Electrolytes Recent Labs  Lab 10/04/18 2236  CALCIUM 9.2    CBC Recent Labs  Lab 10/04/18 2236  WBC 11.2*  HGB 14.9  HCT 43.2  PLT 274    Coag's Recent Labs  Lab 10/04/18 2236  APTT 25  INR 0.9    Sepsis Markers No results for input(s): LATICACIDVEN, PROCALCITON, O2SATVEN in the last 168 hours.  ABG No results for input(s): PHART, PCO2ART, PO2ART in the last 168 hours.  Liver Enzymes Recent Labs  Lab 10/04/18 2236  AST 26  ALT 11  ALKPHOS 60  BILITOT 0.8  ALBUMIN 3.8    Cardiac Enzymes Recent Labs  Lab 10/04/18 2236  TROPONINI 2.08*    Glucose Recent Labs  Lab 10/05/18 0057  GLUCAP 86    Imaging Dg Chest Port 1 View  Result Date: 10/04/2018 CLINICAL DATA:  38 year old female with chest pain. EXAM: PORTABLE CHEST 1 VIEW COMPARISON:  Chest radiograph dated 06/03/2018 FINDINGS: There is stable mild cardiomegaly. Coronary vascular stents noted. There is no focal consolidation, pleural effusion, pneumothorax. No acute osseous pathology. IMPRESSION: No active disease. Electronically Signed   By: Elgie CollardArash  Radparvar M.D.   On: 10/04/2018 23:05    STUDIES:  2D echo pending   SIGNIFICANT EVENTS: 10/05/2018: Admitted  LINES/TUBES: Peripheral IV   ASSESSMENT  ST elevation MI status post PCI with stent placement to the RCA Uncontrolled hypertension CAD status  post MI x2 Hyperlipidemia Tobacco use disorder Hypokalemia  PLAN Hemodynamic monitoring per ICU protocol Treatment plan per cardiology Relay all home medications Labetalol 10 mg every 2 hours as needed for systolic blood pressure greater than 160 Monitor and correct electrolytes  Best Practice: Code Status: Full code Diet: Heart healthy diet GI prophylaxis: Not indicated VTE prophylaxis: Subcu Lovenox  FAMILY  - Updates: Plan of care reviewed with patient  Tommey Barret S. Tukov-Yual ANP-BC Pulmonary and Critical Care Medicine [facility] Pager [phone] or [phone]  NB: This document was prepared using Dragon voice recognition software and may include unintentional dictation errors.    10/05/2018, 2:24 AM

## 2018-10-05 NOTE — Progress Notes (Signed)
ANTICOAGULATION CONSULT NOTE - Initial Consult  Pharmacy Consult for heparin drip Indication: chest pain/ACS  No Known Allergies  Patient Measurements: Weight: 172 lb (78 kg) Heparin Dosing Weight: 58 kg  Vital Signs: Temp: 98.3 F (36.8 C) (05/09 2233) Temp Source: Oral (05/09 2233) BP: 169/107 (05/09 2330) Pulse Rate: 96 (05/09 2330)  Labs: Recent Labs    10/04/18 2236  HGB 14.9  HCT 43.2  PLT 274  APTT 25  LABPROT 12.0  INR 0.9  CREATININE 0.96  TROPONINI 2.08*    Estimated Creatinine Clearance: 72.7 mL/min (by C-G formula based on SCr of 0.96 mg/dL).   Medical History: Past Medical History:  Diagnosis Date  . Hyperlipidemia   . Hypertension   . Myocardial infarction [facility])    2014    Medications:  Scheduled:    Assessment: Patient admitted as a code STEMI, was working when felt onset of CP and received 2 nitro sprays, and ASA 325 mg x 1. Patient's initial trop 2.08, was given a heparin bolus of 4000 units IV x 1 and taken emergently to cath. No anticoagulation PTA. Will start patient on heparin drip for STEMI  Goal of Therapy:  Heparin level 0.3-0.7 units/ml Monitor platelets by anticoagulation protocol: Yes   Plan:  Patient received heparin 4000 units IV x 1 bolus in ED Appears that patient received a second bolus of 4000 units IV x 1 in cath Will start heparin 1050 units/hr Baseline labs WNL Will f/u w/ patient post-cath to determine need for continued anticoagulation post-cath and will schedule anti-Xa 6 hours post start of drip. Will monitor daily CBC's and adjust per anti-Xa levels.  Thomasene Ripple, PharmD, BCPS Clinical Pharmacist 10/05/2018

## 2018-10-05 NOTE — Progress Notes (Signed)
Progress Note  Patient Name: [name] Date of Encounter: 10/05/2018  Primary Cardiologist:  Kirke CorinArida   Subjective   38 yo with hx of CAD and HTN Admitted with an inferior wall MI  Had PCI of her RCA  She has a 70% stenosis in mid RCA which will be treated medically  She has a 90% LCx stenosis which Dr. Kirke CorinArida is planning on stenting Monday or Tuesday .  The recommendation is for long term DAPT using Brilinta and ASA   Inpatient Medications    Scheduled Meds: . albuterol  2.5 mg Inhalation Q6H  . amLODipine  5 mg Oral Daily  . aspirin EC  81 mg Oral Daily  . atorvastatin  80 mg Oral Daily  . benazepril  20 mg Oral Daily  . carvedilol  6.25 mg Oral BID WC  . [START ON 10/06/2018] enoxaparin (LOVENOX) injection  40 mg Subcutaneous Q24H  . hydrochlorothiazide  25 mg Oral Daily  . sodium chloride flush  3 mL Intravenous Q12H  . ticagrelor  90 mg Oral BID   Continuous Infusions: . sodium chloride    . potassium chloride 10 mEq (10/05/18 0913)   PRN Meds: sodium chloride, acetaminophen, labetalol, nitroGLYCERIN, ondansetron (ZOFRAN) IV, sodium chloride flush, traZODone   Vital Signs    Vitals:   10/05/18 0700 10/05/18 0800 10/05/18 0900 10/05/18 0908  BP: (!) 163/97 (!) 178/83  (!) 174/111  Pulse: 79 72 71 94  Resp: 14 14 18    Temp:  97.6 F (36.4 C)    TempSrc:  Axillary    SpO2: 90% 97% 98%   Weight:      Height:        Intake/Output Summary (Last 24 hours) at 10/05/2018 0958 Last data filed at 10/05/2018 [id number] Gross per 24 hour  Intake 494.49 ml  Output 700 ml  Net -205.51 ml   Last 3 Weights 10/05/2018 10/04/2018 06/03/2018  Weight (lbs) 168 lb 3.4 oz 172 lb 170 lb  Weight (kg) 76.3 kg 78.019 kg 77.111 kg      Telemetry    NSR  - Personally Reviewed  ECG     NSR  - Personally Reviewed  Physical Exam   GEN: No acute distress.   Getting echo currently  Neck: No JVD Cardiac: RRR, no murmurs, rubs, or gallops.  Respiratory: Clear to auscultation  bilaterally. GI: Soft, nontender, non-distended  MS: No edema; No deformity. Neuro:  Nonfocal  Psych: Normal affect   Labs    Chemistry Recent Labs  Lab 10/04/18 2236 10/05/18 0437  NA 137 138  K 3.1* 2.9*  CL 100 105  CO2 24 23  GLUCOSE 105* 123*  BUN 18 15  CREATININE 0.96 0.67  CALCIUM 9.2 9.1  PROT 7.9  --   ALBUMIN 3.8  --   AST 26  --   ALT 11  --   ALKPHOS 60  --   BILITOT 0.8  --   GFRNONAA >60 >60  GFRAA >60 >60  ANIONGAP 13 10     Hematology Recent Labs  Lab 10/04/18 2236 10/05/18 0437  WBC 11.2* 8.7  RBC 4.65 4.30  HGB 14.9 13.6  HCT 43.2 39.7  MCV 92.9 92.3  MCH 32.0 31.6  MCHC 34.5 34.3  RDW 13.2 13.2  PLT 274 233    Cardiac Enzymes Recent Labs  Lab 10/04/18 2236 10/05/18 0437  TROPONINI 2.08* 3.35*   No results for input(s): TROPIPOC in the last 168 hours.   BNPNo  results for input(s): BNP, PROBNP in the last 168 hours.   DDimer No results for input(s): DDIMER in the last 168 hours.   Radiology    Dg Chest Port 1 View  Result Date: 10/04/2018 CLINICAL DATA:  38 year old female with chest pain. EXAM: PORTABLE CHEST 1 VIEW COMPARISON:  Chest radiograph dated 06/03/2018 FINDINGS: There is stable mild cardiomegaly. Coronary vascular stents noted. There is no focal consolidation, pleural effusion, pneumothorax. No acute osseous pathology. IMPRESSION: No active disease. Electronically Signed   By: Elgie Collard M.D.   On: 10/04/2018 23:05    Cardiac Studies    Patient Profile      38 yo with hx of CAD and HTN admitted with inferior MI   Assessment & Plan    1.   Acute inferior MI:   S/p stenting of her prox RCA  Feeling better.  Continue brilinta and ASA long term   2.   CAD:  She has a 90% stenosis in her LCX. Dr. Kirke Corin is planning on stenting on Monday or Tuesday   3.  HTN ;  BP is still elevated.  LVEDP was elevated during her cath . She will need further diuresis .  Is getting labetalol for PRN BP control  4.   Hypokalemia:   Is being replaced by primary team .   For questions or updates, please contact [facility] Please consult [email or web address] for contact info under        Signed, Kristeen Miss, MD  10/05/2018, 9:58 AM

## 2018-10-05 NOTE — Progress Notes (Signed)
ANTICOAGULATION CONSULT NOTE - Initial Consult  Pharmacy Consult for heparin drip Indication: chest pain/ACS  No Known Allergies  Patient Measurements: Height: 5' (152.4 cm) Weight: 168 lb 3.4 oz (76.3 kg) IBW/kg (Calculated) : 45.5 Heparin Dosing Weight: 58 kg  Vital Signs: Temp: 98.3 F (36.8 C) (05/10 0106) Temp Source: Oral (05/10 0106) BP: 144/97 (05/10 0106) Pulse Rate: 92 (05/10 0106)  Labs: Recent Labs    10/04/18 2236  HGB 14.9  HCT 43.2  PLT 274  APTT 25  LABPROT 12.0  INR 0.9  CREATININE 0.96  TROPONINI 2.08*    Estimated Creatinine Clearance: 71.8 mL/min (by C-G formula based on SCr of 0.96 mg/dL).   Medical History: Past Medical History:  Diagnosis Date  . Hyperlipidemia   . Hypertension   . Myocardial infarction [facility])    2014    Medications:  Scheduled:  . albuterol  2.5 mg Inhalation Q6H  . amLODipine  5 mg Oral Daily  . aspirin EC  81 mg Oral Daily  . atorvastatin  80 mg Oral Daily  . benazepril  20 mg Oral Daily  . carvedilol  6.25 mg Oral BID WC  . [START ON 10/06/2018] enoxaparin (LOVENOX) injection  40 mg Subcutaneous Q24H  . hydrochlorothiazide  25 mg Oral Daily  . sodium chloride flush  3 mL Intravenous Q12H  . ticagrelor  90 mg Oral BID    Assessment: Patient admitted as a code STEMI, was working when felt onset of CP and received 2 nitro sprays, and ASA 325 mg x 1. Patient's initial trop 2.08, was given a heparin bolus of 4000 units IV x 1 and taken emergently to cath. No anticoagulation PTA. Will start patient on heparin drip for STEMI  Goal of Therapy:  Heparin level 0.3-0.7 units/ml Monitor platelets by anticoagulation protocol: Yes   Plan:  05/10 @ 0100 heparin drip discontinued post-cath per cardiology. Will sign off heparin consult.  Thomasene Ripple, PharmD, BCPS Clinical Pharmacist 10/05/2018

## 2018-10-06 ENCOUNTER — Encounter: Payer: Self-pay | Admitting: Cardiovascular Disease

## 2018-10-06 LAB — LIPID PANEL
Cholesterol: 220 mg/dL — ABNORMAL HIGH (ref 0–200)
HDL: 52 mg/dL (ref >40)
LDL Cholesterol: 128 mg/dL — ABNORMAL HIGH (ref 0–99)
Total CHOL/HDL Ratio: 4.2 RATIO
Triglycerides: 201 mg/dL — ABNORMAL HIGH (ref <150)
VLDL: 40 mg/dL (ref 0–40)

## 2018-10-06 LAB — ECHOCARDIOGRAM COMPLETE
Height: 60 in
Weight: 2691.38 oz

## 2018-10-06 LAB — MAGNESIUM: Magnesium: 2.1 mg/dL (ref 1.7–2.4)

## 2018-10-06 LAB — BASIC METABOLIC PANEL
Anion gap: 10 (ref 5–15)
BUN: 16 mg/dL (ref 6–20)
CO2: 23 mmol/L (ref 22–32)
Calcium: 9.4 mg/dL (ref 8.9–10.3)
Chloride: 108 mmol/L (ref 98–111)
Creatinine, Ser: 0.76 mg/dL (ref 0.44–1.00)
GFR calc Af Amer: >60 mL/min (ref >60)
GFR calc non Af Amer: >60 mL/min (ref >60)
Glucose, Bld: 113 mg/dL — ABNORMAL HIGH (ref 70–99)
Potassium: 3.7 mmol/L (ref 3.5–5.1)
Sodium: 141 mmol/L (ref 135–145)

## 2018-10-06 LAB — PHOSPHORUS: Phosphorus: 2.9 mg/dL (ref 2.5–4.6)

## 2018-10-06 MED ORDER — ASPIRIN 81 MG PO CHEW
81.0000 mg | CHEWABLE_TABLET | ORAL | Status: AC
  Administered 2018-10-07: 81 mg via ORAL
  Filled 2018-10-06: qty 1

## 2018-10-06 MED ORDER — SODIUM CHLORIDE 0.9 % IV SOLN: 250.0000 mL | INTRAVENOUS | Status: DC | PRN

## 2018-10-06 MED ORDER — SODIUM CHLORIDE 0.9 % IV SOLN
INTRAVENOUS | Status: DC
  Administered 2018-10-07: 06:00:00 via INTRAVENOUS

## 2018-10-06 MED ORDER — SODIUM CHLORIDE 0.9% FLUSH
3.0000 mL | Freq: Two times a day (BID) | INTRAVENOUS | Status: DC
  Administered 2018-10-06: 3 mL via INTRAVENOUS

## 2018-10-06 MED ORDER — SODIUM CHLORIDE 0.9% FLUSH: 3.0000 mL | INTRAVENOUS | Status: DC | PRN

## 2018-10-06 NOTE — Progress Notes (Addendum)
[employer] [facility] [place] [facility]   PATIENT NAME: [name]    MR#:  [id number]  DATE OF BIRTH:  01/21/1981  SUBJECTIVE:  Denies SP/SOB  REVIEW OF SYSTEMS:    Review of Systems  Constitutional: Negative for fever, chills weight loss HENT: Negative for ear pain, nosebleeds, congestion, facial swelling, rhinorrhea, neck pain, neck stiffness and ear discharge.   Respiratory: Negative for cough, shortness of breath, wheezing  Cardiovascular: Negative for chest pain, palpitations and leg swelling.  Gastrointestinal: Negative for heartburn, abdominal pain, vomiting, diarrhea or consitpation Genitourinary: Negative for dysuria, urgency, frequency, hematuria Musculoskeletal: Negative for back pain or joint pain Neurological: Negative for dizziness, seizures, syncope, focal weakness,  numbness and headaches.  Hematological: Does not bruise/bleed easily.  Psychiatric/Behavioral: Negative for hallucinations, confusion, dysphoric mood    Tolerating Diet: yes      DRUG ALLERGIES:  No Known Allergies  VITALS:  Blood pressure 140/83, pulse 89, temperature 98.3 F (36.8 C), temperature source Oral, resp. rate 13, height 5' (1.524 m), weight 76.3 kg, last menstrual period 09/25/2018, SpO2 100 %.  PHYSICAL EXAMINATION:  Constitutional: Appears well-developed and well-nourished. No distress. HENT: Normocephalic. Marland Kitchen. Oropharynx is clear and moist.  Eyes: Conjunctivae and EOM are normal. PERRLA, no scleral icterus.  Neck: Normal ROM. Neck supple. No JVD. No tracheal deviation. CVS: RRR, S1/S2 +, no murmurs, no gallops, no carotid bruit.  Pulmonary: Effort and breath sounds normal, no stridor, rhonchi, wheezes, rales.  Abdominal: Soft. BS +,  no distension, tenderness, rebound or guarding.  Musculoskeletal: Normal range of motion. No edema and no tenderness.  Neuro: Alert. CN 2-12 grossly intact. No focal deficits. Skin: Skin is warm and dry. No rash noted. Psychiatric:  Normal mood and affect.      LABORATORY PANEL:   CBC Recent Labs  Lab 10/05/18 0437  WBC 8.7  HGB 13.6  HCT 39.7  PLT 233   ------------------------------------------------------------------------------------------------------------------  Chemistries  Recent Labs  Lab 10/04/18 2236  10/06/18 0437  NA 137   < > 141  K 3.1*   < > 3.7  CL 100   < > 108  CO2 24   < > 23  GLUCOSE 105*   < > 113*  BUN 18   < > 16  CREATININE 0.96   < > 0.76  CALCIUM 9.2   < > 9.4  MG  --    < > 2.1  AST 26  --   --   ALT 11  --   --   ALKPHOS 60  --   --   BILITOT 0.8  --   --    < > = values in this interval not displayed.   ------------------------------------------------------------------------------------------------------------------  Cardiac Enzymes Recent Labs  Lab 10/04/18 2236 10/05/18 0437  TROPONINI 2.08* 3.35*   ------------------------------------------------------------------------------------------------------------------  RADIOLOGY:  Dg Chest Port 1 View  Result Date: 10/04/2018 CLINICAL DATA:  38 year old female with chest pain. EXAM: PORTABLE CHEST 1 VIEW COMPARISON:  Chest radiograph dated 06/03/2018 FINDINGS: There is stable mild cardiomegaly. Coronary vascular stents noted. There is no focal consolidation, pleural effusion, pneumothorax. No acute osseous pathology. IMPRESSION: No active disease. Electronically Signed   By: Elgie CollardArash  Radparvar M.D.   On: 10/04/2018 23:05     ASSESSMENT AND PLAN:   38 year old female with history of CAD status post RCA stent x2, hypertension and tobacco dependence who presented with chest pain and found to have STEMI.  1.  Inferior ST elevation MI: Patient is status  post PCI and drug-eluting stent to distal right coronary artery. She has high-grade stenosis in mid left circumflex and will undergo staged PCI tomorrow. Cardiology consultation appreciated. Continue dual antiplatelet therapy with aspirin and Brilinta indefinitely  as per recommendations by cardiology.  Continue Coreg,  Lotensin and atorvastatin.  2.Tobacco dependence: Patient is encouraged to quit smoking and willing to attempt to quit was assessed. Patient highly motivated.  3.  Essential hypertension: Continue Coreg, Norvasc, Lotensin, HCTZ  4.  Hypokalemia: Repleted  5.  Marijuana abuse: Patient encouraged to remain abstinent   Management plans discussed with the patient and she is in agreement.  CODE STATUS: full  TOTAL TIME TAKING CARE OF THIS PATIENT: 27 minutes.     POSSIBLE D/C 2 days, DEPENDING ON CLINICAL CONDITION.   Adrian Saran M.D on 10/06/2018 at 11:18 AM  Between 7am to 6pm - Pager - [phone] After 6pm go to [email or web address] - Social research officer, government  [facility] [place] [facility]  Office  [phone]  CC: Primary care physician; White, Arlyss Repress, NP  Note: This dictation was prepared with Dragon dictation along with smaller phrase technology. Any transcriptional errors that result from this process are unintentional.

## 2018-10-06 NOTE — H&P (View-Only) (Signed)
Progress Note  Patient Name: [name] Date of Encounter: 10/06/2018  Primary Cardiologist: new Kirke Corin)  Subjective   No chest pain or shortness of breath.  Inpatient Medications    Scheduled Meds:  amLODipine  5 mg Oral Daily   aspirin EC  81 mg Oral Daily   atorvastatin  80 mg Oral Daily   benazepril  20 mg Oral Daily   carvedilol  6.25 mg Oral BID WC   enoxaparin (LOVENOX) injection  40 mg Subcutaneous Q24H   hydrochlorothiazide  25 mg Oral Daily   sodium chloride flush  3 mL Intravenous Q12H   ticagrelor  90 mg Oral BID   Continuous Infusions:  sodium chloride     PRN Meds: sodium chloride, acetaminophen, albuterol, labetalol, nitroGLYCERIN, ondansetron (ZOFRAN) IV, sodium chloride flush, traZODone   Vital Signs    Vitals:   10/06/18 0300 10/06/18 0400 10/06/18 0500 10/06/18 0600  BP:   113/85 (!) 116/95  Pulse: 82 82 92 77  Resp: (!) 6 18 17 13   Temp:      TempSrc:      SpO2: 97% 96% 96% 98%  Weight:      Height:        Intake/Output Summary (Last 24 hours) at 10/06/2018 0852 Last data filed at 10/05/2018 1942 Gross per 24 hour  Intake 624.4 ml  Output --  Net 624.4 ml   Last 3 Weights 10/05/2018 10/04/2018 06/03/2018  Weight (lbs) 168 lb 3.4 oz 172 lb 170 lb  Weight (kg) 76.3 kg 78.019 kg 77.111 kg      Telemetry    Normal sinus rhythm with no significant arrhythmia- Personally Reviewed  ECG    Sinus rhythm with old inferior infarct and minor inferior ST elevation with ST depression in lateral leads, LVH with repolarization abnormalities- Personally Reviewed  Physical Exam   GEN: No acute distress.   Neck: No JVD Cardiac: RRR, no murmurs, rubs, or gallops.  Right radial pulses normal with no hematoma Respiratory: Clear to auscultation bilaterally. GI: Soft, nontender, non-distended  MS: No edema; No deformity. Neuro:  Nonfocal  Psych: Normal affect   Labs    Chemistry Recent Labs  Lab 10/04/18 2236 10/05/18 0437  10/05/18 2211 10/06/18 0437  NA 137 138  --  141  K 3.1* 2.9* 3.9 3.7  CL 100 105  --  108  CO2 24 23  --  23  GLUCOSE 105* 123*  --  113*  BUN 18 15  --  16  CREATININE 0.96 0.67  --  0.76  CALCIUM 9.2 9.1  --  9.4  PROT 7.9  --   --   --   ALBUMIN 3.8  --   --   --   AST 26  --   --   --   ALT 11  --   --   --   ALKPHOS 60  --   --   --   BILITOT 0.8  --   --   --   GFRNONAA >60 >60  --  >60  GFRAA >60 >60  --  >60  ANIONGAP 13 10  --  10     Hematology Recent Labs  Lab 10/04/18 2236 10/05/18 0437  WBC 11.2* 8.7  RBC 4.65 4.30  HGB 14.9 13.6  HCT 43.2 39.7  MCV 92.9 92.3  MCH 32.0 31.6  MCHC 34.5 34.3  RDW 13.2 13.2  PLT 274 233    Cardiac Enzymes Recent Labs  Lab 10/04/18 °2236 10/05/18 °0437  °TROPONINI 2.08* 3.35*  ° No results for input(s): TROPIPOC in the last 168 hours.  ° °BNPNo results for input(s): BNP, PROBNP in the last 168 hours.  ° °DDimer No results for input(s): DDIMER in the last 168 hours.  ° °Radiology  °  °Dg Chest Port 1 View ° °Result Date: 10/04/2018 °CLINICAL DATA:  38-year-old female with chest pain. EXAM: PORTABLE CHEST 1 VIEW COMPARISON:  Chest radiograph dated 06/03/2018 FINDINGS: There is stable mild cardiomegaly. Coronary vascular stents noted. There is no focal consolidation, pleural effusion, pneumothorax. No acute osseous pathology. IMPRESSION: No active disease. Electronically Signed   By: Arash  Radparvar M.D.   On: 10/04/2018 23:05  ° ° °Cardiac Studies  ° °Echocardiogram was done yesterday and was personally reviewed by me: ° ° 1. The left ventricle has normal systolic function, with an ejection fraction of 55-60%. The cavity size was normal. There is severe concentric left ventricular hypertrophy. Left ventricular diastolic Doppler parameters are indeterminate. ° 2. The right ventricle has normal systolic function. The cavity was normal. There is no increase in right ventricular wall thickness. Right ventricular systolic pressure could not  be assessed. ° 3. The tricuspid valve is grossly normal. ° 4. The aortic valve is grossly normal. Aortic valve regurgitation was not assessed by color flow Doppler. ° °Patient Profile  °   °38 y.o. female with known history of coronary artery disease status post RCA stents x2 in 2013 and 2014, uncontrolled hypertension, hyperlipidemia and tobacco use who presented on Saturday night with inferior ST elevation myocardial infarction in the setting of severely elevated blood pressure ° °Assessment & Plan  °  °1.  Inferior ST elevation myocardial infarction: Status post PCI and drug-eluting stent placement to the distal right coronary artery.  There was residual 70% stenosis in the mid RCA at the proximal edge of the old stents in addition to 30 to 40% proximal disease.  All of that was left to be treated medically.  She does have high-grade stenosis in the mid left circumflex for which I recommend staged PCI to be done tomorrow.  I discussed the procedure in detail as well as risks and benefits.  Continue dual antiplatelet therapy indefinitely.  The patient was referred to cardiac rehab.  I discussed with her the importance of controlling her risk factors. °2. Essential hypertension: She was extremely hypertensive on presentation with systolic blood pressure above 200.  Blood pressure is now controlled.  Metoprolol was switched to carvedilol.  I again discussed with her the importance of compliance with medications. °3.  Hyperlipidemia: Continue high-dose atorvastatin with a target LDL of less than 70. °4.  Tobacco use: I discussed with her the importance of smoking cessation. ° °I discussed the plan with the patient's nurse. °   ° °For questions or updates, please contact [facility] °Please consult [email or web address] for contact info under  ° °  °   °Signed, °Eris Hannan, MD  °10/06/2018, 8:52 AM   ° °

## 2018-10-06 NOTE — Plan of Care (Signed)
Nutrition Education Note  RD consulted for nutrition education regarding a Heart Healthy diet.   Lipid Panel     Component Value Date/Time   CHOL 214 (H) 10/15/2014 0557   CHOL 214 (H) 01/08/2014 0737   TRIG 686 (H) 10/15/2014 0557   TRIG 280 (H) 01/08/2014 0737   HDL 38 (L) 10/15/2014 0557   HDL 42 01/08/2014 0737   CHOLHDL 5.6 10/15/2014 0557   VLDL NOT CALCULATED 10/15/2014 0557   VLDL 56 (H) 01/08/2014 0737   LDLCALC NOT CALCULATED 10/15/2014 0557   LDLCALC 116 (H) 01/08/2014 0737   Met with patient at bedside. She has not previously learned about heart healthy diet. She typically eats two meals per day (skips breakfast). She works at [employer] and has lunch there. She typically has a burger with fries. Dinner is prepared at home and patient reports if "varies" what she may have and would not provide specific details. She reports she drinks only water during the day. She reports she does not eat sweets.  RD provided "Heart Healthy Nutrition Therapy" handout from the Academy of [employer]etics. Reviewed patient's dietary recall. Provided examples on ways to decrease sodium and fat intake in diet. Discouraged intake of processed foods and use of salt shaker. Encouraged fresh fruits and vegetables as well as whole grain sources of carbohydrates to maximize fiber intake. Teach back method used.  Expect fair to good compliance.  Body mass index is 32.85 kg/m. Pt meets criteria for obesity class I based on current BMI.  Current diet order is heart healthy, patient is consuming approximately 90% of meals at this time. Labs and medications reviewed. No further nutrition interventions warranted at this time. RD contact information provided. If additional nutrition issues arise, please re-consult RD.  Willey Blade, MS, Ronda, LDN Office: [phone] Pager: [phone] After Hours/Weekend Pager: [phone]

## 2018-10-06 NOTE — TOC Initial Note (Signed)
Transition of Care [facility]) - Initial/Assessment Note    Patient Details  Name: [name] MRN: [id number] Date of Birth: 05/16/1981  Transition of [facility]) CM/SW Contact:    Sherren Kerns, RN Phone Number: 10/06/2018, 3:27 PM  Clinical Narrative:     Patient is independent from home with minor children.  Admitted with STEMI.  Will be starting on Brilinta.  Patient does not have insurance.  Financial counseling has started Medicaid application.  Patient PCP-[facility].  Called in [place] [facility] in [place]; coupon card reads 30 days free for Medicaid or Medicare Part D.   [facility] pharmacist ran card and there will be no charge.  Will notify Dr. Kirke Corin.              Expected Discharge Plan: Home/Self Care Barriers to Discharge: Continued Medical Work up   Patient Goals and CMS Choice        Expected Discharge Plan and Services Expected Discharge Plan: Home/Self Care   Discharge Planning Services: CM Consult, Medication Assistance   Living arrangements for the past 2 months: Single Family Home                                      Prior Living Arrangements/Services Living arrangements for the past 2 months: Single Family Home Lives with:: Minor Children Patient language and need for interpreter reviewed:: Yes Do you feel safe going back to the place where you live?: Yes            Criminal Activity/Legal Involvement Pertinent to Current Situation/Hospitalization: No - Comment as needed  Activities of Daily Living      Permission Sought/Granted                  Emotional Assessment Appearance:: Appears stated age Attitude/Demeanor/Rapport: Gracious Affect (typically observed): Accepting Orientation: : Oriented to Self, Oriented to Place, Oriented to  Time, Oriented to Situation Alcohol / Substance Use: Not Applicable    Admission diagnosis:  Unstable angina pectoris (HCC) [I20.0] Hypertension, unspecified type [I10] ST elevation  myocardial infarction (STEMI) of inferior wall (HCC) [I21.19] Patient Active Problem List   Diagnosis Date Noted  . ST elevation myocardial infarction (STEMI) of inferior wall (HCC) 10/05/2018  . Hypertension   . Hypokalemia   . Trimalleolar fracture of ankle, closed 02/28/2015  . NSTEMI (non-ST elevated myocardial infarction) (HCC) 10/14/2014   PCP:  Titus Mould, NP Pharmacy:   [facility] [address], [place] - [address] [place] [address] Phone: [phone] Fax: [phone]     Social Determinants of Health (SDOH) Interventions    Readmission Risk Interventions No flowsheet data found.

## 2018-10-06 NOTE — Progress Notes (Addendum)
Progress Note  Patient Name: [name] Date of Encounter: 10/06/2018  Primary Cardiologist: new Kirke Corin)  Subjective   No chest pain or shortness of breath.  Inpatient Medications    Scheduled Meds:  amLODipine  5 mg Oral Daily   aspirin EC  81 mg Oral Daily   atorvastatin  80 mg Oral Daily   benazepril  20 mg Oral Daily   carvedilol  6.25 mg Oral BID WC   enoxaparin (LOVENOX) injection  40 mg Subcutaneous Q24H   hydrochlorothiazide  25 mg Oral Daily   sodium chloride flush  3 mL Intravenous Q12H   ticagrelor  90 mg Oral BID   Continuous Infusions:  sodium chloride     PRN Meds: sodium chloride, acetaminophen, albuterol, labetalol, nitroGLYCERIN, ondansetron (ZOFRAN) IV, sodium chloride flush, traZODone   Vital Signs    Vitals:   10/06/18 0300 10/06/18 0400 10/06/18 0500 10/06/18 0600  BP:   113/85 (!) 116/95  Pulse: 82 82 92 77  Resp: (!) 6 18 17 13   Temp:      TempSrc:      SpO2: 97% 96% 96% 98%  Weight:      Height:        Intake/Output Summary (Last 24 hours) at 10/06/2018 0852 Last data filed at 10/05/2018 1942 Gross per 24 hour  Intake 624.4 ml  Output --  Net 624.4 ml   Last 3 Weights 10/05/2018 10/04/2018 06/03/2018  Weight (lbs) 168 lb 3.4 oz 172 lb 170 lb  Weight (kg) 76.3 kg 78.019 kg 77.111 kg      Telemetry    Normal sinus rhythm with no significant arrhythmia- Personally Reviewed  ECG    Sinus rhythm with old inferior infarct and minor inferior ST elevation with ST depression in lateral leads, LVH with repolarization abnormalities- Personally Reviewed  Physical Exam   GEN: No acute distress.   Neck: No JVD Cardiac: RRR, no murmurs, rubs, or gallops.  Right radial pulses normal with no hematoma Respiratory: Clear to auscultation bilaterally. GI: Soft, nontender, non-distended  MS: No edema; No deformity. Neuro:  Nonfocal  Psych: Normal affect   Labs    Chemistry Recent Labs  Lab 10/04/18 2236 10/05/18 0437  10/05/18 2211 10/06/18 0437  NA 137 138  --  141  K 3.1* 2.9* 3.9 3.7  CL 100 105  --  108  CO2 24 23  --  23  GLUCOSE 105* 123*  --  113*  BUN 18 15  --  16  CREATININE 0.96 0.67  --  0.76  CALCIUM 9.2 9.1  --  9.4  PROT 7.9  --   --   --   ALBUMIN 3.8  --   --   --   AST 26  --   --   --   ALT 11  --   --   --   ALKPHOS 60  --   --   --   BILITOT 0.8  --   --   --   GFRNONAA >60 >60  --  >60  GFRAA >60 >60  --  >60  ANIONGAP 13 10  --  10     Hematology Recent Labs  Lab 10/04/18 2236 10/05/18 0437  WBC 11.2* 8.7  RBC 4.65 4.30  HGB 14.9 13.6  HCT 43.2 39.7  MCV 92.9 92.3  MCH 32.0 31.6  MCHC 34.5 34.3  RDW 13.2 13.2  PLT 274 233    Cardiac Enzymes Recent Labs  Lab 10/04/18 2236 10/05/18 0437  TROPONINI 2.08* 3.35*   No results for input(s): TROPIPOC in the last 168 hours.   BNPNo results for input(s): BNP, PROBNP in the last 168 hours.   DDimer No results for input(s): DDIMER in the last 168 hours.   Radiology    Dg Chest Port 1 View  Result Date: 10/04/2018 CLINICAL DATA:  38 year old female with chest pain. EXAM: PORTABLE CHEST 1 VIEW COMPARISON:  Chest radiograph dated 06/03/2018 FINDINGS: There is stable mild cardiomegaly. Coronary vascular stents noted. There is no focal consolidation, pleural effusion, pneumothorax. No acute osseous pathology. IMPRESSION: No active disease. Electronically Signed   By: Elgie CollardArash  Radparvar M.D.   On: 10/04/2018 23:05    Cardiac Studies   Echocardiogram was done yesterday and was personally reviewed by me:   1. The left ventricle has normal systolic function, with an ejection fraction of 55-60%. The cavity size was normal. There is severe concentric left ventricular hypertrophy. Left ventricular diastolic Doppler parameters are indeterminate.  2. The right ventricle has normal systolic function. The cavity was normal. There is no increase in right ventricular wall thickness. Right ventricular systolic pressure could not  be assessed.  3. The tricuspid valve is grossly normal.  4. The aortic valve is grossly normal. Aortic valve regurgitation was not assessed by color flow Doppler.  Patient Profile     38 y.o. female with known history of coronary artery disease status post RCA stents x2 in 2013 and 2014, uncontrolled hypertension, hyperlipidemia and tobacco use who presented on Saturday night with inferior ST elevation myocardial infarction in the setting of severely elevated blood pressure  Assessment & Plan    1.  Inferior ST elevation myocardial infarction: Status post PCI and drug-eluting stent placement to the distal right coronary artery.  There was residual 70% stenosis in the mid RCA at the proximal edge of the old stents in addition to 30 to 40% proximal disease.  All of that was left to be treated medically.  She does have high-grade stenosis in the mid left circumflex for which I recommend staged PCI to be done tomorrow.  I discussed the procedure in detail as well as risks and benefits.  Continue dual antiplatelet therapy indefinitely.  The patient was referred to cardiac rehab.  I discussed with her the importance of controlling her risk factors. 2. Essential hypertension: She was extremely hypertensive on presentation with systolic blood pressure above 295200.  Blood pressure is now controlled.  Metoprolol was switched to carvedilol.  I again discussed with her the importance of compliance with medications. 3.  Hyperlipidemia: Continue high-dose atorvastatin with a target LDL of less than 70. 4.  Tobacco use: I discussed with her the importance of smoking cessation.  I discussed the plan with the patient's nurse.     For questions or updates, please contact [facility] Please consult [email or web address] for contact info under        Signed, Lorine BearsMuhammad Malyssa Maris, MD  10/06/2018, 8:52 AM

## 2018-10-06 NOTE — Progress Notes (Signed)
NPO after midnight.  CHG bath completed.

## 2018-10-07 ENCOUNTER — Encounter
Admission: EM | Disposition: A | Discharge status: Home / Self Care | Payer: Self-pay | Source: Home / Self Care | Attending: Internal Medicine

## 2018-10-07 ENCOUNTER — Encounter: Payer: Self-pay | Admitting: Certified Registered Nurse Anesthetist

## 2018-10-07 DIAGNOSIS — I214 Non-ST elevation (NSTEMI) myocardial infarction: Secondary | ICD-10-CM

## 2018-10-07 HISTORY — PX: CORONARY STENT INTERVENTION: CATH118234

## 2018-10-07 LAB — CBC
HCT: 38.3 % (ref 36.0–46.0)
Hemoglobin: 12.7 g/dL (ref 12.0–15.0)
MCH: 31.5 pg (ref 26.0–34.0)
MCHC: 33.2 g/dL (ref 30.0–36.0)
MCV: 95 fL (ref 80.0–100.0)
Platelets: 230 10*3/uL (ref 150–400)
RBC: 4.03 MIL/uL (ref 3.87–5.11)
RDW: 13 % (ref 11.5–15.5)
WBC: 8.2 10*3/uL (ref 4.0–10.5)
nRBC: 0 % (ref 0.0–0.2)

## 2018-10-07 LAB — POCT ACTIVATED CLOTTING TIME
Activated Clotting Time: 351 seconds
Activated Clotting Time: 378 seconds
Activated Clotting Time: 257 seconds

## 2018-10-07 SURGERY — CORONARY STENT INTERVENTION
Anesthesia: Moderate Sedation

## 2018-10-07 MED ORDER — MIDAZOLAM HCL 2 MG/2ML IJ SOLN
INTRAMUSCULAR | Status: AC
  Filled 2018-10-07: qty 2

## 2018-10-07 MED ORDER — HEPARIN SODIUM (PORCINE) 1000 UNIT/ML IJ SOLN
INTRAMUSCULAR | Status: DC | PRN
  Administered 2018-10-07: 7000 [IU] via INTRAVENOUS

## 2018-10-07 MED ORDER — HYDRALAZINE HCL 20 MG/ML IJ SOLN: 10.0000 mg | INTRAMUSCULAR | Status: AC | PRN

## 2018-10-07 MED ORDER — NITROGLYCERIN 1 MG/10 ML FOR IR/CATH LAB
INTRA_ARTERIAL | Status: AC
  Filled 2018-10-07: qty 10

## 2018-10-07 MED ORDER — HEPARIN SODIUM (PORCINE) 1000 UNIT/ML IJ SOLN
INTRAMUSCULAR | Status: AC
  Filled 2018-10-07: qty 1

## 2018-10-07 MED ORDER — VERAPAMIL HCL 2.5 MG/ML IV SOLN
INTRAVENOUS | Status: AC
  Filled 2018-10-07: qty 2

## 2018-10-07 MED ORDER — ENOXAPARIN SODIUM 40 MG/0.4ML ~~LOC~~ SOLN: 40.0000 mg | SUBCUTANEOUS | Status: DC

## 2018-10-07 MED ORDER — SODIUM CHLORIDE 0.9% FLUSH: 3.0000 mL | Freq: Two times a day (BID) | INTRAVENOUS | Status: DC

## 2018-10-07 MED ORDER — MIDAZOLAM HCL 2 MG/2ML IJ SOLN
INTRAMUSCULAR | Status: DC | PRN
  Administered 2018-10-07: 1 mg via INTRAVENOUS

## 2018-10-07 MED ORDER — HEPARIN (PORCINE) IN NACL 1000-0.9 UT/500ML-% IV SOLN
INTRAVENOUS | Status: DC | PRN
  Administered 2018-10-07: 500 mL

## 2018-10-07 MED ORDER — IOHEXOL 300 MG/ML  SOLN
INTRAMUSCULAR | Status: DC | PRN
  Administered 2018-10-07: 80 mL via INTRAVENOUS

## 2018-10-07 MED ORDER — SODIUM CHLORIDE 0.9% FLUSH: 3.0000 mL | INTRAVENOUS | Status: DC | PRN

## 2018-10-07 MED ORDER — SODIUM CHLORIDE 0.9 % IV SOLN: 250.0000 mL | INTRAVENOUS | Status: DC | PRN

## 2018-10-07 MED ORDER — LABETALOL HCL 5 MG/ML IV SOLN: 10.0000 mg | INTRAVENOUS | Status: AC | PRN

## 2018-10-07 MED ORDER — NITROGLYCERIN 1 MG/10 ML FOR IR/CATH LAB
INTRA_ARTERIAL | Status: DC | PRN
  Administered 2018-10-07: 100 ug via INTRACORONARY
  Administered 2018-10-07: 100 ug

## 2018-10-07 MED ORDER — FENTANYL CITRATE (PF) 100 MCG/2ML IJ SOLN
INTRAMUSCULAR | Status: DC | PRN
  Administered 2018-10-07: 25 ug via INTRAVENOUS

## 2018-10-07 MED ORDER — SODIUM CHLORIDE 0.9 % IV SOLN
INTRAVENOUS | Status: AC
  Administered 2018-10-07: 16:00:00 via INTRAVENOUS

## 2018-10-07 MED ORDER — FENTANYL CITRATE (PF) 100 MCG/2ML IJ SOLN
INTRAMUSCULAR | Status: AC
  Filled 2018-10-07: qty 2

## 2018-10-07 MED ORDER — HEPARIN (PORCINE) IN NACL 1000-0.9 UT/500ML-% IV SOLN
INTRAVENOUS | Status: AC
  Filled 2018-10-07: qty 1000

## 2018-10-07 MED ORDER — VERAPAMIL HCL 2.5 MG/ML IV SOLN
INTRAVENOUS | Status: DC | PRN
  Administered 2018-10-07: 2.5 mg via INTRA_ARTERIAL

## 2018-10-07 SURGICAL SUPPLY — 17 items
BALLN MINITREK RX 2.0X8 (BALLOONS) ×3
BALLN ~~LOC~~ TREK RX 2.5X12 (BALLOONS) ×3
BALLN ~~LOC~~ TREK RX 2.75X8 (BALLOONS) ×3
BALLOON MINITREK RX 2.0X8 (BALLOONS) ×1 IMPLANT
BALLOON ~~LOC~~ TREK RX 2.5X12 (BALLOONS) ×1 IMPLANT
BALLOON ~~LOC~~ TREK RX 2.75X8 (BALLOONS) ×1 IMPLANT
CATH INFINITI JR4 5F (CATHETERS) ×3 IMPLANT
CATH VISTA GUIDE 6FR XB3 (CATHETERS) ×3 IMPLANT
DEVICE INFLAT 30 PLUS (MISCELLANEOUS) ×3 IMPLANT
DEVICE RAD TR BAND REGULAR (VASCULAR PRODUCTS) ×3 IMPLANT
GLIDESHEATH SLEND SS 6F .021 (SHEATH) ×3 IMPLANT
KIT MANI 3VAL PERCEP (MISCELLANEOUS) ×3 IMPLANT
KIT RIGHT HEART (MISCELLANEOUS) IMPLANT
PACK CARDIAC CATH (CUSTOM PROCEDURE TRAY) ×3 IMPLANT
STENT RESOLUTE ONYX 2.25X18 (Permanent Stent) ×3 IMPLANT
WIRE ROSEN-J .035X260CM (WIRE) ×3 IMPLANT
WIRE RUNTHROUGH .014X180CM (WIRE) ×3 IMPLANT

## 2018-10-07 NOTE — Plan of Care (Signed)
  Problem: Clinical Measurements: Goal: Respiratory complications will improve Outcome: Progressing Note:  Room air   Problem: Activity: Goal: Risk for activity intolerance will decrease Outcome: Progressing Note:  Independent in room tolerating well   Problem: Coping: Goal: Level of anxiety will decrease Outcome: Progressing   Problem: Elimination: Goal: Will not experience complications related to urinary retention Outcome: Progressing   Problem: Pain Managment: Goal: General experience of comfort will improve Outcome: Progressing Note:  No complaints of pain this shift   Problem: Safety: Goal: Ability to remain free from injury will improve Outcome: Progressing   Problem: Education: Goal: Knowledge of General Education information will improve Description Including pain rating scale, medication(s)/side effects and non-pharmacologic comfort measures Outcome: Completed/Met   Problem: Nutrition: Goal: Adequate nutrition will be maintained Outcome: Completed/Met

## 2018-10-07 NOTE — Brief Op Note (Signed)
BRIEF CARDIAC CATHETERIZATION NOTE:  DATE: 10/07/2018  TIME: 1:04 PM  PATIENT:  [name] Im  38 y.o. female  PRE-OPERATIVE DIAGNOSIS:  NSTEMI  POST-OPERATIVE DIAGNOSIS:  NSTEMI  PROCEDURE:  Procedure(s): CORONARY STENT INTERVENTION (N/A)  SURGEON:  Surgeon(s) and Role:    * Tong Pieczynski, MD - Primary  FINDINGS: 1. Mild to moderate, non-obstructive disease involving the LAD. 2. 80% mid LCx stenosis. 3. Successful PCI to mid LCx using Resolute Onyx 2.25 x 18 mm DES with 0% residual stenosis and TIMI-3 flow.  RECOMMENDATIONS: 1. Continue at least 12 months of DAPT with ASA and ticagrelor. 2. Aggressive secondary prevention. 3. Likely d/c home tomorrow.  Yvonne Kendall, MD [facility] Pager: [phone]

## 2018-10-07 NOTE — Progress Notes (Signed)
[employer] [facility] [place] [facility]   PATIENT NAME: [name]    MR#:  [id number]  DATE OF BIRTH:  06/30/1980  SUBJECTIVE:  Patient denies chest pain She is agitated she wants to go home she has young kids at home waiting for her  REVIEW OF SYSTEMS:    Review of Systems  Constitutional: Negative for fever, chills weight loss HENT: Negative for ear pain, nosebleeds, congestion, facial swelling, rhinorrhea, neck pain, neck stiffness and ear discharge.   Respiratory: Negative for cough, shortness of breath, wheezing  Cardiovascular: Negative for chest pain, palpitations and leg swelling.  Gastrointestinal: Negative for heartburn, abdominal pain, vomiting, diarrhea or consitpation Genitourinary: Negative for dysuria, urgency, frequency, hematuria Musculoskeletal: Negative for back pain or joint pain Neurological: Negative for dizziness, seizures, syncope, focal weakness,  numbness and headaches.  Hematological: Does not bruise/bleed easily.  Psychiatric/Behavioral: Negative for hallucinations, confusion, dysphoric mood    Tolerating Diet:  NPO     DRUG ALLERGIES:  No Known Allergies  VITALS:  Blood pressure 114/77, pulse 82, temperature 97.9 F (36.6 C), temperature source Oral, resp. rate 20, height 5' (1.524 m), weight 73.3 kg, last menstrual period 09/25/2018, SpO2 100 %.  PHYSICAL EXAMINATION:  Constitutional: Appears well-developed and well-nourished. No distress. HENT: Normocephalic. Marland Kitchen. Oropharynx is clear and moist.  Eyes: Conjunctivae and EOM are normal. PERRLA, no scleral icterus.  Neck: Normal ROM. Neck supple. No JVD. No tracheal deviation. CVS: RRR, S1/S2 +, no murmurs, no gallops, no carotid bruit.  Pulmonary: Effort and breath sounds normal, no stridor, rhonchi, wheezes, rales.  Abdominal: Soft. BS +,  no distension, tenderness, rebound or guarding.  Musculoskeletal: Normal range of motion. No edema and no tenderness.  Neuro: Alert. CN 2-12  grossly intact. No focal deficits. Skin: Skin is warm and dry. No rash noted. Psychiatric: Normal mood and affect.      LABORATORY PANEL:   CBC Recent Labs  Lab 10/05/18 0437  WBC 8.7  HGB 13.6  HCT 39.7  PLT 233   ------------------------------------------------------------------------------------------------------------------  Chemistries  Recent Labs  Lab 10/04/18 2236  10/06/18 0437  NA 137   < > 141  K 3.1*   < > 3.7  CL 100   < > 108  CO2 24   < > 23  GLUCOSE 105*   < > 113*  BUN 18   < > 16  CREATININE 0.96   < > 0.76  CALCIUM 9.2   < > 9.4  MG  --    < > 2.1  AST 26  --   --   ALT 11  --   --   ALKPHOS 60  --   --   BILITOT 0.8  --   --    < > = values in this interval not displayed.   ------------------------------------------------------------------------------------------------------------------  Cardiac Enzymes Recent Labs  Lab 10/04/18 2236 10/05/18 0437  TROPONINI 2.08* 3.35*   ------------------------------------------------------------------------------------------------------------------  RADIOLOGY:  No results found.   ASSESSMENT AND PLAN:   38 year old female with history of CAD status post RCA stent x2, hypertension and tobacco dependence who presented with chest pain and found to have STEMI.  1.  Inferior ST elevation MI: Patient is status post PCI and drug-eluting stent to distal right coronary artery. She has high-grade stenosis in mid left circumflex and will undergo staged PCI this am Cardiology consultation appreciated. Continue dual antiplatelet therapy with aspirin and Brilinta indefinitely as per recommendations by cardiology.  Continue Coreg,  Lotensin and  atorvastatin.  2.Tobacco dependence: Patient is encouraged to quit smoking and willing to attempt to quit was assessed. Patient highly motivated.  3.  Essential hypertension: Continue Coreg, Norvasc, Lotensin, HCTZ  4.  Hypokalemia: Repleted  5.  Marijuana abuse:  Patient encouraged to remain abstinent   Management plans discussed with the patient and she is in agreement.  CODE STATUS: full  TOTAL TIME TAKING CARE OF THIS PATIENT: 27 minutes.     POSSIBLE D/C tomorrow DEPENDING ON CLINICAL CONDITION.   Adrian Saran M.D on 10/07/2018 at 12:27 PM  Between 7am to 6pm - Pager - [phone] After 6pm go to [email or web address] - Social research officer, government  [facility]  Office  [phone]  CC: Primary care physician; White, Arlyss Repress, NP  Note: This dictation was prepared with Dragon dictation along with smaller phrase technology. Any transcriptional errors that result from this process are unintentional.

## 2018-10-07 NOTE — Interval H&P Note (Signed)
History and Physical Interval Note:  10/07/2018 11:10 AM  [name]  has presented today for cardiac catheterization, with the diagnosis of NSTEMI.  The various methods of treatment have been discussed with the patient and family. After consideration of risks, benefits and other options for treatment, the patient has consented to  Procedure(s): CORONARY STENT INTERVENTION (N/A) as a surgical intervention.  The patient's history has been reviewed, patient examined, no change in status, stable for surgery.  I have reviewed the patient's chart and labs.  Questions were answered to the patient's satisfaction.    Cath Lab Visit (complete for each Cath Lab visit)  Clinical Evaluation Leading to the Procedure:   ACS: Yes.    Non-ACS:  N/A  [name]

## 2018-10-08 ENCOUNTER — Telehealth: Payer: Self-pay | Admitting: Cardiovascular Disease

## 2018-10-08 ENCOUNTER — Telehealth: Payer: Self-pay

## 2018-10-08 ENCOUNTER — Encounter: Payer: Self-pay | Admitting: Internal Medicine

## 2018-10-08 DIAGNOSIS — Z955 Presence of coronary angioplasty implant and graft: Secondary | ICD-10-CM

## 2018-10-08 DIAGNOSIS — Z720 Tobacco use: Secondary | ICD-10-CM

## 2018-10-08 DIAGNOSIS — I119 Hypertensive heart disease without heart failure: Secondary | ICD-10-CM

## 2018-10-08 DIAGNOSIS — E785 Hyperlipidemia, unspecified: Secondary | ICD-10-CM

## 2018-10-08 LAB — CBC
HCT: 43.5 % (ref 36.0–46.0)
Hemoglobin: 14.4 g/dL (ref 12.0–15.0)
MCH: 31.5 pg (ref 26.0–34.0)
MCHC: 33.1 g/dL (ref 30.0–36.0)
MCV: 95.2 fL (ref 80.0–100.0)
Platelets: 254 10*3/uL (ref 150–400)
RBC: 4.57 MIL/uL (ref 3.87–5.11)
RDW: 12.9 % (ref 11.5–15.5)
WBC: 9.1 10*3/uL (ref 4.0–10.5)
nRBC: 0 % (ref 0.0–0.2)

## 2018-10-08 LAB — BASIC METABOLIC PANEL
Anion gap: 10 (ref 5–15)
BUN: 17 mg/dL (ref 6–20)
CO2: 24 mmol/L (ref 22–32)
Calcium: 9.2 mg/dL (ref 8.9–10.3)
Chloride: 104 mmol/L (ref 98–111)
Creatinine, Ser: 0.71 mg/dL (ref 0.44–1.00)
GFR calc Af Amer: >60 mL/min (ref >60)
GFR calc non Af Amer: >60 mL/min (ref >60)
Glucose, Bld: 151 mg/dL — ABNORMAL HIGH (ref 70–99)
Potassium: 3.3 mmol/L — ABNORMAL LOW (ref 3.5–5.1)
Sodium: 138 mmol/L (ref 135–145)

## 2018-10-08 MED ORDER — CARVEDILOL 6.25 MG PO TABS: 6.2500 mg | ORAL_TABLET | Freq: Two times a day (BID) | ORAL | 0 refills | Status: DC

## 2018-10-08 MED ORDER — NITROGLYCERIN 0.4 MG SL SUBL: 0.4000 mg | SUBLINGUAL_TABLET | SUBLINGUAL | 1 refills | Status: AC | PRN

## 2018-10-08 MED ORDER — POTASSIUM CHLORIDE CRYS ER 20 MEQ PO TBCR
40.0000 meq | EXTENDED_RELEASE_TABLET | Freq: Once | ORAL | Status: AC
  Administered 2018-10-08: 40 meq via ORAL
  Filled 2018-10-08: qty 2

## 2018-10-08 MED ORDER — TICAGRELOR 90 MG PO TABS: 90.0000 mg | ORAL_TABLET | Freq: Two times a day (BID) | ORAL | 0 refills | Status: DC

## 2018-10-08 MED ORDER — NICOTINE 21 MG/24HR TD PT24: 21.0000 mg | MEDICATED_PATCH | Freq: Every day | TRANSDERMAL | 0 refills | Status: AC

## 2018-10-08 NOTE — Progress Notes (Signed)
Discharge instructions explained to pt/ verbalized an understanding/ ivs and tele removed/ RX given to pt/ transported off unit via wheelchair.

## 2018-10-08 NOTE — Telephone Encounter (Signed)
Virtual Visit Pre-Appointment Phone Call  "(Name), I am calling you today to discuss your upcoming appointment. We are currently trying to limit exposure to the virus that causes COVID-19 by seeing patients at home rather than in the office."  1. "What is the BEST phone number to call the day of the visit?" - include this in appointment notes  2. Do you have or have access to (through a family member/friend) a smartphone with video capability that we can use for your visit?" a. If yes - list this number in appt notes as cell (if different from BEST phone #) and list the appointment type as a VIDEO visit in appointment notes b. If no - list the appointment type as a PHONE visit in appointment notes  3. Confirm consent - "In the setting of the current Covid19 crisis, you are scheduled for a (phone or video) visit with your provider on (date) at (time).  Just as we do with many in-office visits, in order for you to participate in this visit, we must obtain consent.  If you'd like, I can send this to your mychart (if signed up) or email for you to review.  Otherwise, I can obtain your verbal consent now.  All virtual visits are billed to your insurance company just like a normal visit would be.  By agreeing to a virtual visit, we'd like you to understand that the technology does not allow for your provider to perform an examination, and thus may limit your provider's ability to fully assess your condition. If your provider identifies any concerns that need to be evaluated in person, we will make arrangements to do so.  Finally, though the technology is pretty good, we cannot assure that it will always work on either your or our end, and in the setting of a video visit, we may have to convert it to a phone-only visit.  In either situation, we cannot ensure that we have a secure connection.  Are you willing to proceed?" STAFF: Did the patient verbally acknowledge consent to telehealth visit? Document  YES/NO here: yes   4. Advise patient to be prepared - "Two hours prior to your appointment, go ahead and check your blood pressure, pulse, oxygen saturation, and your weight (if you have the equipment to check those) and write them all down. When your visit starts, your provider will ask you for this information. If you have an Apple Watch or Kardia device, please plan to have heart rate information ready on the day of your appointment. Please have a pen and paper handy nearby the day of the visit as well."  5. Give patient instructions for MyChart download to smartphone OR [email or web address] as below if video visit (depending on what platform provider is using)  6. Inform patient they will receive a phone call 15 minutes prior to their appointment time (may be from unknown caller ID) so they should be prepared to answer    TELEPHONE CALL NOTE  [name] has been deemed a candidate for a follow-up tele-health visit to limit community exposure during the Covid-19 pandemic. I spoke with the patient via phone to ensure availability of phone/video source, confirm preferred email & phone number, and discuss instructions and expectations.  I reminded [name] to be prepared with any vital sign and/or heart rhythm information that could potentially be obtained via home monitoring, at the time of her visit. I reminded [name] to expect a phone call prior  to her visit.  Monica MaxcyJennifer B Meza 10/08/2018 9:58 AM   INSTRUCTIONS FOR DOWNLOADING THE MYCHART APP TO SMARTPHONE  - The patient must first make sure to have activated MyChart and know their login information - If Apple, go to [employer] Store and type in MyChart in the search bar and download the app. If Android, ask patient to go to [employer] Play Store and type in [place] in the search bar and download the app. The app is free but as with any other app downloads, their phone may require them to verify saved payment information or Apple/Android  password.  - The patient will need to then log into the app with their MyChart username and password, and select [facility] [place] as their healthcare provider to link the account. When it is time for your visit, go to the MyChart app, find appointments, and click Begin Video Visit. Be sure to Select Allow for your device to access the Microphone and Camera for your visit. You will then be connected, and your provider will be with you shortly.  **If they have any issues connecting, or need assistance please contact MyChart service desk [phone])**  **If using a computer, in order to ensure the best quality for their visit they will need to use either of the following Internet Browsers: [employer] Edge, or Google Chrome**  IF USING DOXIMITY or DOXY.ME - The patient will receive a link just prior to their visit by text.     FULL LENGTH CONSENT FOR TELE-HEALTH VISIT   I hereby voluntarily request, consent and authorize [facility] and its employed or contracted physicians, physician assistants, nurse practitioners or other licensed health care professionals (the Practitioner), to provide me with telemedicine health care services (the Services") as deemed necessary by the treating Practitioner. I acknowledge and consent to receive the Services by the Practitioner via telemedicine. I understand that the telemedicine visit will involve communicating with the Practitioner through live audiovisual communication technology and the disclosure of certain medical information by electronic transmission. I acknowledge that I have been given the opportunity to request an in-person assessment or other available alternative prior to the telemedicine visit and am voluntarily participating in the telemedicine visit.  I understand that I have the right to withhold or withdraw my consent to the use of telemedicine in the course of my care at any time, without affecting my right to future care or treatment,  and that the Practitioner or I may terminate the telemedicine visit at any time. I understand that I have the right to inspect all information obtained and/or recorded in the course of the telemedicine visit and may receive copies of available information for a reasonable fee.  I understand that some of the potential risks of receiving the Services via telemedicine include:   Delay or interruption in medical evaluation due to technological equipment failure or disruption;  Information transmitted may not be sufficient (e.g. poor resolution of images) to allow for appropriate medical decision making by the Practitioner; and/or   In rare instances, security protocols could fail, causing a breach of personal health information.  Furthermore, I acknowledge that it is my responsibility to provide information about my medical history, conditions and care that is complete and accurate to the best of my ability. I acknowledge that Practitioner's advice, recommendations, and/or decision may be based on factors not within their control, such as incomplete or inaccurate data provided by me or distortions of diagnostic images or specimens that may result from electronic transmissions.  I understand that the practice of medicine is not an exact science and that Practitioner makes no warranties or guarantees regarding treatment outcomes. I acknowledge that I will receive a copy of this consent concurrently upon execution via email to the email address I last provided but may also request a printed copy by calling the office of [facility].    I understand that my insurance will be billed for this visit.   I have read or had this consent read to me.  I understand the contents of this consent, which adequately explains the benefits and risks of the Services being provided via telemedicine.   I have been provided ample opportunity to ask questions regarding this consent and the Services and have had my questions  answered to my satisfaction.  I give my informed consent for the services to be provided through the use of telemedicine in my medical care  By participating in this telemedicine visit I agree to the above.

## 2018-10-08 NOTE — Plan of Care (Signed)
  Problem: Clinical Measurements: Goal: Will remain free from infection Outcome: Progressing   Problem: Activity: Goal: Risk for activity intolerance will decrease Outcome: Progressing   Problem: Coping: Goal: Level of anxiety will decrease Outcome: Progressing   Problem: Pain Managment: Goal: General experience of comfort will improve Outcome: Progressing Note:  No complaints of pain this shift   Problem: Safety: Goal: Ability to remain free from injury will improve Outcome: Progressing   Problem: Skin Integrity: Goal: Risk for impaired skin integrity will decrease Outcome: Progressing   Problem: Cardiovascular: Goal: Vascular access site(s) Level 0-1 will be maintained Outcome: Progressing Note:  Right radial cath site, level 0- no bleeding/bruising    Problem: Clinical Measurements: Goal: Respiratory complications will improve Outcome: Completed/Met

## 2018-10-08 NOTE — Telephone Encounter (Signed)
TCM....  Patient is being discharged   They saw Kirke Corin / End   They are scheduled to see Brion Aliment 5/20 at [facility]   They were seen for STEMI s/p Stent   They need to be seen within  1-2 wks  Please call

## 2018-10-08 NOTE — Progress Notes (Addendum)
Progress Note  Patient Name: [name] Date of Encounter: 10/08/2018  Primary Cardiologist: Kirke CorinArida  Subjective   Patient feels well, denying chest pain, shortness of breath, and pain at right radial cath site.  Inpatient Medications    Scheduled Meds: . amLODipine  5 mg Oral Daily  . aspirin EC  81 mg Oral Daily  . atorvastatin  80 mg Oral Daily  . benazepril  20 mg Oral Daily  . carvedilol  6.25 mg Oral BID WC  . enoxaparin (LOVENOX) injection  40 mg Subcutaneous Q24H  . hydrochlorothiazide  25 mg Oral Daily  . sodium chloride flush  3 mL Intravenous Q12H  . sodium chloride flush  3 mL Intravenous Q12H  . ticagrelor  90 mg Oral BID   Continuous Infusions: . sodium chloride    . sodium chloride     PRN Meds: sodium chloride, sodium chloride, acetaminophen, albuterol, labetalol, nitroGLYCERIN, ondansetron (ZOFRAN) IV, sodium chloride flush, sodium chloride flush, traZODone   Vital Signs    Vitals:   10/07/18 1716 10/07/18 1950 10/08/18 0457 10/08/18 0755  BP: 114/67 117/83 (!) 141/91 (!) 145/97  Pulse: 89 85 84 92  Resp: 18 20 20 20   Temp:  (!) 97.3 F (36.3 C) 98.1 F (36.7 C) 97.6 F (36.4 C)  TempSrc:  Oral Oral Oral  SpO2: 100% 100% 100% 100%  Weight:      Height:        Intake/Output Summary (Last 24 hours) at 10/08/2018 0827 Last data filed at 10/08/2018 0506 Gross per 24 hour  Intake 1317.5 ml  Output 1300 ml  Net 17.5 ml   Last 3 Weights 10/07/2018 10/07/2018 10/05/2018  Weight (lbs) 161 lb 9.6 oz 161 lb 11.2 oz 168 lb 3.4 oz  Weight (kg) 73.3 kg 73.347 kg 76.3 kg      Telemetry    NSR - Personally Reviewed  ECG    NSR with LVH and abnormal repolarization.  No change - Personally Reviewed  Physical Exam   GEN: No acute distress.   Neck: No JVD Cardiac: RRR, no murmurs, rubs, or gallops. Right radial arteriotomy covered with clean dressing.  2+ right radial pulse.  No hematoma. Respiratory: Clear to auscultation bilaterally. GI: Soft,  nontender, non-distended  MS: No edema; No deformity. Neuro:  Nonfocal  Psych: Normal affect   Labs    Chemistry Recent Labs  Lab 10/04/18 2236 10/05/18 0437 10/05/18 2211 10/06/18 0437 10/08/18 0326  NA 137 138  --  141 138  K 3.1* 2.9* 3.9 3.7 3.3*  CL 100 105  --  108 104  CO2 24 23  --  23 24  GLUCOSE 105* 123*  --  113* 151*  BUN 18 15  --  16 17  CREATININE 0.96 0.67  --  0.76 0.71  CALCIUM 9.2 9.1  --  9.4 9.2  PROT 7.9  --   --   --   --   ALBUMIN 3.8  --   --   --   --   AST 26  --   --   --   --   ALT 11  --   --   --   --   ALKPHOS 60  --   --   --   --   BILITOT 0.8  --   --   --   --   GFRNONAA >60 >60  --  >60 >60  GFRAA >60 >60  --  >60 >60  ANIONGAP 13 10  --  10 10     Hematology Recent Labs  Lab 10/05/18 0437 10/07/18 1725 10/08/18 0326  WBC 8.7 8.2 9.1  RBC 4.30 4.03 4.57  HGB 13.6 12.7 14.4  HCT 39.7 38.3 43.5  MCV 92.3 95.0 95.2  MCH 31.6 31.5 31.5  MCHC 34.3 33.2 33.1  RDW 13.2 13.0 12.9  PLT 233 230 254    Cardiac Enzymes Recent Labs  Lab 10/04/18 2236 10/05/18 0437  TROPONINI 2.08* 3.35*   No results for input(s): TROPIPOC in the last 168 hours.   BNPNo results for input(s): BNP, PROBNP in the last 168 hours.   DDimer No results for input(s): DDIMER in the last 168 hours.   Radiology    No results found.  Cardiac Studies   Echo (10/05/18):  1. The left ventricle has normal systolic function, with an ejection fraction of 55-60%. The cavity size was normal. There is severe concentric left ventricular hypertrophy. Left ventricular diastolic Doppler parameters are indeterminate.  2. The right ventricle has normal systolic function. The cavity was normal. There is no increase in right ventricular wall thickness. Right ventricular systolic pressure could not be assessed.  3. The tricuspid valve is grossly normal.  4. The aortic valve is grossly normal. Aortic valve regurgitation was not assessed by color flow Doppler.  PCI  (10/07/18): 1. Mild, non-obstructive disease involving the LAD. 2. 90% mid LCx stenosis extending into OM2. 3. Successful PCI to mid LCx/OM2 using Resolute Onyx 2.25 x 18 mm DES with 0% residual stenosis and TIMI-3 flow.  LHC/PCI (10/04/18): 1.  Severe two-vessel coronary artery disease.  The culprit seems to be severe stenosis in the distal right coronary artery.  The stents in the mid to distal RCA are patent with mild to moderate in-stent restenosis and borderline significant stenosis proximal to the stent.  There is also severe stenosis in the mid left circumflex. 2.  Normal LV systolic function.   3.Severely elevated blood pressure with an LVEDP of 22 mmHg. 4.  Successful angioplasty and drug-eluting stent placement to the distal right coronary artery.  Patient Profile     38 y.o. female with history of CAD, hypertensive heart disease with uncontrolled hypertension, hyperlipidemia, and tobacco use, admitted with inferior STEMI now s/p PCI to distal RCA and mid LCx/OM2.  Assessment & Plan    Inferior STEMI: No chest pain or other complaints.  Patient is now s/p PCI to the distal RCA and mid LCx/OM2.  Remainder of non-critical CAD will be managed medically.  Continue DAPT with ASA and ticagrelor for at least 12 months.  Aggressive secondary prevention, including high-intensity statin therapy and smoking cessation.  Outpatient cardiac rehab, when able based on COVID-19 precautions.  Hypertensive heart disease: Severe LVH noted by echo.  Patient appears euvolemic.  LVEDP during yesterday's cath was normal.  BP mildly elevated this morning, which may be due to the patient not receiving her evening BP medications yesterday.  Continue current doses of amlodipine, carvedilol, HCTZ, and benazepril.  Hyperlipidemia:  Continue atorvastatin 80 mg daily.  Hypokalemia: Potassium slightly low this morning.  Replete for goal K > 4.0.  Consider switching HCTZ to triamterene-HCTZ in the  future if hypokalemia persists.  Tobacco use:  Encourage smoking cessation.  [facility] will sign off.  Patient is stable for discharge home today. Medication Recommendations:  Continue current medications, as outlined above.  Please provide patient with prescription for sublingual nitroglycerin to be used as needed for chest pain.  Other recommendations (labs, testing, etc):  None. Follow up as an outpatient:  1-2 TCM appointment with Dr. Kirke Corin or APP.  We will make arrangements for this visit.  For questions or updates, please contact [facility] Please consult [email or web address] for contact info under [facility].     Signed, Yvonne Kendall, MD  10/08/2018, 8:27 AM

## 2018-10-08 NOTE — Discharge Summary (Signed)
[employer] - [place] at [facility]   PATIENT NAME: [name]    MR#:  [id number]  DATE OF BIRTH:  1980/12/02  DATE OF ADMISSION:  10/04/2018 ADMITTING PHYSICIAN: Iran Ouch, MD  DATE OF DISCHARGE: 10/08/2018  PRIMARY CARE PHYSICIAN: Titus Mould, NP    ADMISSION DIAGNOSIS:  Unstable angina pectoris (HCC) [I20.0] Hypertension, unspecified type [I10] ST elevation myocardial infarction (STEMI) of inferior wall (HCC) [I21.19]  DISCHARGE DIAGNOSIS:  Active Problems:   Non-ST elevation (NSTEMI) myocardial infarction (HCC)   Hypertension   Hypokalemia   SECONDARY DIAGNOSIS:   Past Medical History:  Diagnosis Date  . Hyperlipidemia   . Hypertension   . Myocardial infarction [facility])    2014    HOSPITAL COURSE:   37 year old female with history of CAD status post RCA stent x2, hypertension and tobacco dependence who presented with chest pain and found to have STEMI.  1.  Inferior ST elevation MI: Patient is status post PCI and drug-eluting stent to distal right coronary artery and mid circumflex/OM 2.  Remainder of noncritical CAD will be managed medically as per cardiology. She will continue dual antiplatelet therapy with aspirin and Brilinta indefinitely as per recommendations by cardiology.  Continue Coreg,  Lotensin and atorvastatin. Outpatient cardiac rehab has been recommended.    2.Tobacco dependence: Patient is encouraged to quit smoking and willing to attempt to quit was assessed. Patient highly motivated.  She will be discharged with nicotine patch.  3.  Essential hypertension: Echocardiogram shows severe LVH.  Continue Coreg, Norvasc, Lotensin, HCTZ.  Will need close outpatient follow-up.  4.  Hypokalemia: Repleted  5.  Marijuana abuse: Patient encouraged to remain abstinent 6.  Hyperlipidemia with LDL of 128 which is not at goal. She will continue atorvastatin 80 mg daily.  7.  Hypokalemia which was repleted DISCHARGE  CONDITIONS AND DIET:   Stable for discharge on heart healthy diet  CONSULTS OBTAINED:  Treatment Team:  Iran Ouch, MD  DRUG ALLERGIES:  No Known Allergies  DISCHARGE MEDICATIONS:   Allergies as of 10/08/2018   No Known Allergies     Medication List    STOP taking these medications   clopidogrel 75 MG tablet Commonly known as:  PLAVIX   metoprolol tartrate 25 MG tablet Commonly known as:  LOPRESSOR   traZODone 50 MG tablet Commonly known as:  DESYREL     TAKE these medications   Albuterol Sulfate 108 (90 Base) MCG/ACT Aepb Commonly known as:  ProAir RespiClick Inhale 2 puffs into the lungs every 6 (six) hours.   amLODipine 5 MG tablet Commonly known as:  NORVASC Take 1 tablet (5 mg total) by mouth daily.   aspirin EC 81 MG tablet Take 1 tablet (81 mg total) by mouth daily.   atorvastatin 80 MG tablet Commonly known as:  LIPITOR Take 1 tablet (80 mg total) by mouth daily.   benazepril 20 MG tablet Commonly known as:  LOTENSIN Take 1 tablet (20 mg total) by mouth daily.   carvedilol 6.25 MG tablet Commonly known as:  COREG Take 1 tablet (6.25 mg total) by mouth 2 (two) times daily with a meal.   hydrochlorothiazide 25 MG tablet Commonly known as:  HYDRODIURIL Take 1 tablet (25 mg total) by mouth daily.   nicotine 21 mg/24hr patch Commonly known as:  Nicoderm CQ Place 1 patch (21 mg total) onto the skin daily.   nitroGLYCERIN 0.4 MG SL tablet Commonly known as:  NITROSTAT Place 1 tablet (0.4 mg  total) under the tongue every 5 (five) minutes as needed.   ticagrelor 90 MG Tabs tablet Commonly known as:  BRILINTA Take 1 tablet (90 mg total) by mouth 2 (two) times daily.         Today   CHIEF COMPLAINT:   Patient is ready to go home denies chest pain or shortness of breath.   VITAL SIGNS:  Blood pressure (!) 145/97, pulse 92, temperature 97.6 F (36.4 C), temperature source Oral, resp. rate 20, height 5' (1.524 m), weight 73.3 kg,  last menstrual period 09/25/2018, SpO2 100 %.   REVIEW OF SYSTEMS:  Review of Systems  Constitutional: Negative.  Negative for chills, fever and malaise/fatigue.  HENT: Negative.  Negative for ear discharge, ear pain, hearing loss, nosebleeds and sore throat.   Eyes: Negative.  Negative for blurred vision and pain.  Respiratory: Negative.  Negative for cough, hemoptysis, shortness of breath and wheezing.   Cardiovascular: Negative.  Negative for chest pain, palpitations and leg swelling.  Gastrointestinal: Negative.  Negative for abdominal pain, blood in stool, diarrhea, nausea and vomiting.  Genitourinary: Negative.  Negative for dysuria.  Musculoskeletal: Negative.  Negative for back pain.  Skin: Negative.   Neurological: Negative for dizziness, tremors, speech change, focal weakness, seizures and headaches.  Endo/Heme/Allergies: Negative.  Does not bruise/bleed easily.  Psychiatric/Behavioral: Negative.  Negative for depression, hallucinations and suicidal ideas.     PHYSICAL EXAMINATION:  GENERAL:  38 y.o.-year-old patient lying in the bed with no acute distress.  NECK:  Supple, no jugular venous distention. No thyroid enlargement, no tenderness.  LUNGS: Normal breath sounds bilaterally, no wheezing, rales,rhonchi  No use of accessory muscles of respiration.  CARDIOVASCULAR: S1, S2 normal. No murmurs, rubs, or gallops.  ABDOMEN: Soft, non-tender, non-distended. Bowel sounds present. No organomegaly or mass.  EXTREMITIES: No pedal edema, cyanosis, or clubbing.  PSYCHIATRIC: The patient is alert and oriented x 3.  SKIN: No obvious rash, lesion, or ulcer.   DATA REVIEW:   CBC Recent Labs  Lab 10/08/18 0326  WBC 9.1  HGB 14.4  HCT 43.5  PLT 254    Chemistries  Recent Labs  Lab 10/04/18 2236  10/06/18 0437 10/08/18 0326  NA 137   < > 141 138  K 3.1*   < > 3.7 3.3*  CL 100   < > 108 104  CO2 24   < > 23 24  GLUCOSE 105*   < > 113* 151*  BUN 18   < > 16 17   CREATININE 0.96   < > 0.76 0.71  CALCIUM 9.2   < > 9.4 9.2  MG  --    < > 2.1  --   AST 26  --   --   --   ALT 11  --   --   --   ALKPHOS 60  --   --   --   BILITOT 0.8  --   --   --    < > = values in this interval not displayed.    Cardiac Enzymes Recent Labs  Lab 10/04/18 2236 10/05/18 0437  TROPONINI 2.08* 3.35*    Microbiology Results  @  RADIOLOGY:  No results found.    Allergies as of 10/08/2018   No Known Allergies     Medication List    STOP taking these medications   clopidogrel 75 MG tablet Commonly known as:  PLAVIX   metoprolol tartrate 25 MG tablet Commonly known as:  LOPRESSOR  traZODone 50 MG tablet Commonly known as:  DESYREL     TAKE these medications   Albuterol Sulfate 108 (90 Base) MCG/ACT Aepb Commonly known as:  ProAir RespiClick Inhale 2 puffs into the lungs every 6 (six) hours.   amLODipine 5 MG tablet Commonly known as:  NORVASC Take 1 tablet (5 mg total) by mouth daily.   aspirin EC 81 MG tablet Take 1 tablet (81 mg total) by mouth daily.   atorvastatin 80 MG tablet Commonly known as:  LIPITOR Take 1 tablet (80 mg total) by mouth daily.   benazepril 20 MG tablet Commonly known as:  LOTENSIN Take 1 tablet (20 mg total) by mouth daily.   carvedilol 6.25 MG tablet Commonly known as:  COREG Take 1 tablet (6.25 mg total) by mouth 2 (two) times daily with a meal.   hydrochlorothiazide 25 MG tablet Commonly known as:  HYDRODIURIL Take 1 tablet (25 mg total) by mouth daily.   nicotine 21 mg/24hr patch Commonly known as:  Nicoderm CQ Place 1 patch (21 mg total) onto the skin daily.   nitroGLYCERIN 0.4 MG SL tablet Commonly known as:  NITROSTAT Place 1 tablet (0.4 mg total) under the tongue every 5 (five) minutes as needed.   ticagrelor 90 MG Tabs tablet Commonly known as:  BRILINTA Take 1 tablet (90 mg total) by mouth 2 (two) times daily.        Aspirin prescribed at discharge?  Yes High Intensity  Statin Prescribed? (Lipitor 40-80mg  or Crestor 20-40mg ): Yes Beta Blocker Prescribed? Yes For EF <40%, was ACEI/ARB Prescribed? Yes Was EF assessed during THIS hospitalization? Yes Was Cardiac Rehab II ordered? (Included Medically managed Patients): Yes   Management plans discussed with the patient and she is in agreement. Stable for discharge home  Patient should follow up with cardiology  CODE STATUS:     Code Status Orders  (From admission, onward)         Start     Ordered   10/05/18 0101  Full code  Continuous     10/05/18 0100        Code Status History    Date Active Date Inactive Code Status Order ID Comments User Context   03/01/2015 1414 03/02/2015 2140 Full Code [id number]  Kennedy BuckerMenz, Michael, MD Inpatient   10/15/2014 1451 10/16/2014 1537 Full Code [id number]  Iran OuchArida, Muhammad A, MD Inpatient   10/14/2014 1620 10/15/2014 1451 Full Code [id number]  Adrian SaranMody, Kalsey Lull, MD Inpatient      TOTAL TIME TAKING CARE OF THIS PATIENT: 39 minutes.    Note: This dictation was prepared with Dragon dictation along with smaller phrase technology. Any transcriptional errors that result from this process are unintentional.  Adrian SaranSital Michaeleen Down M.D on 10/08/2018 at 10:22 AM  Between 7am to 6pm - Pager - [phone] After 6pm go to [email or web address] - Social research officer, [email or web address] [email or web address]  [facility]  Office  [phone]  CC: Primary care physician; Titus MouldWhite, Elizabeth Burney, NP

## 2018-10-08 NOTE — Telephone Encounter (Signed)
-----   Message from Yvonne Kendall, MD sent at 10/08/2018  8:34 AM EDT ----- Regarding: TCM f/u Good morning,  Could you arrange for a TCM visit (okay to be virtual) with Dr. Kirke Corin or an APP in 1-2 weeks?  The patient will be discharged this morning.  Yvonne Kendall, MD [facility] Pager: [phone]

## 2018-10-08 NOTE — TOC Transition Note (Addendum)
Transition of Care [facility]) - CM/SW Discharge Note   Patient Details  Name: [name] MRN: [id number] Date of Birth: [birth year]  Transition of Care [facility]) CM/SW Contact:  Sherren Kerns, RN Phone Number: 10/08/2018, 10:41 AM   Clinical Narrative:   Marden Noble coupon given for 30 days free.  Medicaid pending.  If medicaid does not become active with in 30 days she is aware her medication may need to be changed.  Encouraged her to keep her cardiology follow up.  No further needs at this time. Dr Kirke Corin is also aware.      Final next level of care: Home/Self Care Barriers to Discharge: No Barriers Identified   Patient Goals and CMS Choice        Discharge Placement                       Discharge Plan and Services   Discharge Planning Services: CM Consult, Medication Assistance                                 Social Determinants of Health (SDOH) Interventions     Readmission Risk Interventions No flowsheet data found.

## 2018-10-08 NOTE — Telephone Encounter (Signed)
Patient currently admitted- TCM call 5/14.

## 2018-10-09 NOTE — Telephone Encounter (Signed)
No answer. Attempted to call patient. LMTCB 10/09/2018

## 2018-10-10 NOTE — Telephone Encounter (Signed)
Attempted to call patient. LMTCB 10/10/2018   

## 2018-10-13 NOTE — Telephone Encounter (Signed)
Agree. Thanks

## 2018-10-13 NOTE — Telephone Encounter (Signed)
Patient contacted regarding discharge from [facility] on 5/12.  Patient understands to follow up with provider Brion Aliment, NP on 5/20 at e visit. Patient understands discharge instructions? yes Patient understands medications and regiment? yes Patient understands to bring all medications to this visit? yes    Pt has f/u. She reports that she is doing well. She did describe having some depression like issues. I asked her if she wanted to hurt herself or hurt anyone else, she denies. She denied feeling hopeless and felt that she had a good support system.   I suggested that she call PCP and let them know about depression like sx.   Advised pt to call for any further questions or concerns.  Routed to provider to make aware.

## 2018-10-15 ENCOUNTER — Emergency Department
Admission: EM | Admit: 2018-10-15 | Discharge: 2018-10-15 | Discharge status: Against Medical Advice | Payer: Managed Care, Other (non HMO) | Attending: Emergency Medicine | Admitting: Emergency Medicine

## 2018-10-15 ENCOUNTER — Telehealth: Payer: Self-pay | Admitting: Nurse Practitioner

## 2018-10-15 ENCOUNTER — Encounter: Payer: Self-pay | Admitting: Emergency Medicine

## 2018-10-15 ENCOUNTER — Other Ambulatory Visit: Payer: Self-pay

## 2018-10-15 DIAGNOSIS — F17210 Nicotine dependence, cigarettes, uncomplicated: Secondary | ICD-10-CM | POA: Diagnosis not present

## 2018-10-15 DIAGNOSIS — Z7982 Long term (current) use of aspirin: Secondary | ICD-10-CM | POA: Insufficient documentation

## 2018-10-15 DIAGNOSIS — I10 Essential (primary) hypertension: Secondary | ICD-10-CM

## 2018-10-15 DIAGNOSIS — Z79899 Other long term (current) drug therapy: Secondary | ICD-10-CM | POA: Insufficient documentation

## 2018-10-15 DIAGNOSIS — R778 Other specified abnormalities of plasma proteins: Secondary | ICD-10-CM

## 2018-10-15 DIAGNOSIS — E785 Hyperlipidemia, unspecified: Secondary | ICD-10-CM | POA: Diagnosis not present

## 2018-10-15 LAB — BASIC METABOLIC PANEL
Anion gap: 9 (ref 5–15)
BUN: 20 mg/dL (ref 6–20)
CO2: 22 mmol/L (ref 22–32)
Calcium: 9.2 mg/dL (ref 8.9–10.3)
Chloride: 104 mmol/L (ref 98–111)
Creatinine, Ser: 0.58 mg/dL (ref 0.44–1.00)
GFR calc Af Amer: >60 mL/min (ref >60)
GFR calc non Af Amer: >60 mL/min (ref >60)
Glucose, Bld: 106 mg/dL — ABNORMAL HIGH (ref 70–99)
Potassium: 3.8 mmol/L (ref 3.5–5.1)
Sodium: 135 mmol/L (ref 135–145)

## 2018-10-15 LAB — CBC
HCT: 40.5 % (ref 36.0–46.0)
Hemoglobin: 13.3 g/dL (ref 12.0–15.0)
MCH: 31.2 pg (ref 26.0–34.0)
MCHC: 32.8 g/dL (ref 30.0–36.0)
MCV: 95.1 fL (ref 80.0–100.0)
Platelets: 313 10*3/uL (ref 150–400)
RBC: 4.26 MIL/uL (ref 3.87–5.11)
RDW: 12.6 % (ref 11.5–15.5)
WBC: 9.3 10*3/uL (ref 4.0–10.5)
nRBC: 0 % (ref 0.0–0.2)

## 2018-10-15 LAB — TROPONIN I: Troponin I: 0.25 ng/mL (ref <0.03)

## 2018-10-15 MED ORDER — SODIUM CHLORIDE 0.9% FLUSH
3.0000 mL | Freq: Once | INTRAVENOUS | Status: AC
  Administered 2018-10-15: 3 mL via INTRAVENOUS

## 2018-10-15 NOTE — ED Triage Notes (Signed)
Says she went for follow up after heart attack and they sent her here for high blood pressure. Says she just took her medications.  Denies any symptoms.

## 2018-10-15 NOTE — ED Provider Notes (Addendum)
[facility] Emergency Department Provider Note  ____________________________________________   I have reviewed the triage vital signs and the nursing notes. Where available I have reviewed prior notes and, if possible and indicated, outside hospital notes.    HISTORY  Chief Complaint Hypertension    HPI [name] is a 38 y.o. female patient seen and evaluated during the coronavirus epidemic during a time with low staffing Who presents today complaining of feeling "just fine".  He had a recent ACS event where a stent was placed, she was going to her follow-up visit but she had neglected to take her blood pressure medications because she was running weight.  She is very anxious about running late, she also bumped her toe which is "fine now" but all those things together her blood pressure elevated.  She had no chest pain she had no shortness of breath she had no complaints but she was sent to the emergency room for further evaluation of those numbers . He does not not feel ill in any way.  Past Medical History:  Diagnosis Date  . Hyperlipidemia   . Hypertension   . Myocardial infarction [facility](HCC)    2014    Patient Active Problem List   Diagnosis Date Noted  . Non-ST elevation (NSTEMI) myocardial infarction (HCC) 10/05/2018  . Hypertension   . Hypokalemia   . Trimalleolar fracture of ankle, closed 02/28/2015  . NSTEMI (non-ST elevated myocardial infarction) (HCC) 10/14/2014    Past Surgical History:  Procedure Laterality Date  . CARDIAC CATHETERIZATION N/A 10/15/2014   Procedure: Left Heart Cath;  Surgeon: Dalia HeadingKenneth A Fath, MD;  Location: [facility] INVASIVE CV LAB;  Service: Cardiovascular;  Laterality: N/A;  . CARDIAC SURGERY  Cardiac stent to right coronary artery  . CORONARY STENT INTERVENTION N/A 10/07/2018   Procedure: CORONARY STENT INTERVENTION;  Surgeon: Yvonne KendallEnd, Christopher, MD;  Location: [facility] INVASIVE CV LAB;  Service: Cardiovascular;  Laterality: N/A;   . CORONARY/GRAFT ACUTE MI REVASCULARIZATION N/A 10/04/2018   Procedure: Coronary/Graft Acute MI Revascularization;  Surgeon: Iran OuchArida, Muhammad A, MD;  Location: [facility] INVASIVE CV LAB;  Service: Cardiovascular;  Laterality: N/A;  . LEFT HEART CATH AND CORONARY ANGIOGRAPHY N/A 10/04/2018   Procedure: LEFT HEART CATH AND CORONARY ANGIOGRAPHY;  Surgeon: Iran OuchArida, Muhammad A, MD;  Location: [facility] INVASIVE CV LAB;  Service: Cardiovascular;  Laterality: N/A;  . ORIF ANKLE FRACTURE Right 03/01/2015   Procedure: OPEN REDUCTION INTERNAL FIXATION (ORIF) ANKLE FRACTURE;  Surgeon: Kennedy BuckerMichael Menz, MD;  Location: [facility] ORS;  Service: Orthopedics;  Laterality: Right;  . STENT PLACEMENT VASCULAR ([facility] HX)    . TUBAL LIGATION  2004    Prior to Admission medications   Medication Sig Start Date End Date Taking? Authorizing Provider  Albuterol Sulfate (PROAIR RESPICLICK) 108 (90 Base) MCG/ACT AEPB Inhale 2 puffs into the lungs every 6 (six) hours. 06/03/18   Fisher, Roselyn BeringSusan W, PA-C  amLODipine (NORVASC) 5 MG tablet Take 1 tablet (5 mg total) by mouth daily. 06/03/18   Fisher, Roselyn BeringSusan W, PA-C  aspirin EC 81 MG tablet Take 1 tablet (81 mg total) by mouth daily. 10/16/14   Enid BaasKalisetti, Radhika, MD  atorvastatin (LIPITOR) 80 MG tablet Take 1 tablet (80 mg total) by mouth daily. 06/03/18   Fisher, Roselyn BeringSusan W, PA-C  benazepril (LOTENSIN) 20 MG tablet Take 1 tablet (20 mg total) by mouth daily. 06/03/18   Fisher, Roselyn BeringSusan W, PA-C  carvedilol (COREG) 6.25 MG tablet Take 1 tablet (6.25 mg total) by mouth 2 (two) times daily with  a meal. 10/08/18   Adrian Saran, MD  hydrochlorothiazide (HYDRODIURIL) 25 MG tablet Take 1 tablet (25 mg total) by mouth daily. 06/03/18   Fisher, Roselyn Bering, PA-C  nicotine (NICODERM CQ) 21 mg/24hr patch Place 1 patch (21 mg total) onto the skin daily. 10/08/18   Adrian Saran, MD  nitroGLYCERIN (NITROSTAT) 0.4 MG SL tablet Place 1 tablet (0.4 mg total) under the tongue every 5 (five) minutes as needed. 10/08/18 10/08/19  Adrian Saran, MD   ticagrelor (BRILINTA) 90 MG TABS tablet Take 1 tablet (90 mg total) by mouth 2 (two) times daily. 10/08/18   Adrian Saran, MD    Allergies Patient has no known allergies.  Family History  Problem Relation Age of Onset  . Diabetes Mother   . Hypertension Mother   . Diabetes Father   . Hypertension Father   . Heart attack Sister     Social History Social History   Tobacco Use  . Smoking status: Current Every Day Smoker    Packs/day: 1.00    Types: Cigarettes  . Smokeless tobacco: Never Used  Substance Use Topics  . Alcohol use: Yes    Comment: occas  . Drug use: No    Review of Systems Constitutional: No fever/chills Eyes: No visual changes. ENT: No sore throat. No stiff neck no neck pain Cardiovascular: Denies chest pain. Respiratory: Denies shortness of breath. Gastrointestinal:   no vomiting.  No diarrhea.  No constipation. Genitourinary: Negative for dysuria. Musculoskeletal: Negative lower extremity swelling Skin: Negative for rash. Neurological: Negative for severe headaches, focal weakness or numbness.   ____________________________________________   PHYSICAL EXAM:  VITAL SIGNS: ED Triage Vitals  Enc Vitals Group     BP 10/15/18 1144 (!) 184/123     Pulse Rate 10/15/18 1144 98     Resp 10/15/18 1144 14     Temp 10/15/18 1144 98.7 F (37.1 C)     Temp Source 10/15/18 1144 Oral     SpO2 10/15/18 1144 100 %     Weight 10/15/18 1145 161 lb 9.6 oz (73.3 kg)     Height 10/15/18 1145 5' (1.524 m)     Head Circumference --      Peak Flow --      Pain Score 10/15/18 1145 0     Pain Loc --      Pain Edu? --      Excl. in GC? --     Constitutional: Alert and oriented. Well appearing and in no acute distress. Eyes: Conjunctivae are normal Head: Atraumatic HEENT: No congestion/rhinnorhea. Mucous membranes are moist.  Oropharynx non-erythematous Neck:   Nontender with no meningismus, no masses, no stridor Cardiovascular: Normal rate, regular rhythm.  Grossly normal heart sounds.  Good peripheral circulation. Respiratory: Normal respiratory effort.  No retractions. Lungs CTAB. Abdominal: Soft and nontender. No distention. No guarding no rebound Back:  There is no focal tenderness or step off.  there is no midline tenderness there are no lesions noted. there is no CVA tenderness  Musculoskeletal: No lower extremity tenderness, no upper extremity tenderness. No joint effusions, no DVT signs strong distal pulses no edema Neurologic:  Normal speech and language. No gross focal neurologic deficits are appreciated.  Skin:  Skin is warm, dry and intact. No rash noted. Psychiatric: Mood and affect are normal. Speech and behavior are normal.  ____________________________________________   LABS (all labs ordered are listed, but only abnormal results are displayed)  Labs Reviewed  CBC  BASIC METABOLIC PANEL  TROPONIN I  POC URINE PREG, ED    Pertinent labs  results that were available during my care of the patient were reviewed by me and considered in my medical decision making (see chart for details). ____________________________________________  EKG  I personally interpreted any EKGs ordered by me or triage Sinus rhythm, rate 94, LVH noted, nonspecific ST changes consistent with prior, no acute ST elevation or depression. ____________________________________________  RADIOLOGY  Pertinent labs & imaging results that were available during my care of the patient were reviewed by me and considered in my medical decision making (see chart for details). If possible, patient and/or family made aware of any abnormal findings.  No results found. ____________________________________________    PROCEDURES  Procedure(s) performed: None  Procedures  Critical Care performed: None  ____________________________________________   INITIAL IMPRESSION / ASSESSMENT AND PLAN / ED COURSE  Pertinent labs & imaging results that were available  during my care of the patient were reviewed by me and considered in my medical decision making (see chart for details).  In here he did not take her blood pressure medications, of  which there are several, and had asymptomatic hypertension was sent to the emergency room during the coronavirus epidemic for further evaluation of asymptomatic hypertension because she recently had stents placed.  She has no symptoms.  Basic blood work has been initiated by protocol that is negative is my hope to get her safely home.  She is asymptomatic and did not take her meds.  ----------------------------------------- 2:40 PM on 10/15/2018 ----------------------------------------- Patient's cardiac enzymes are borderline, this could be because of recent Lora Havens remains asymptomatic her blood pressure remains elevated she states she is very irritated and does not want to be here however she feels that is driving of her pressure.  We have paged her cardiologist and we will hopefully be able to talk to him before she decides to leave.  ----------------------------------------- 3:26 PM on 10/15/2018 -----------------------------------------  Patient remains asymptomatic, I gave her a full range of options after discussing with Dr. Mariah Milling.  Appreciate consult.  Admission was what was advised to her is the safest policy and that was our medical advice however she declined that.  I then offered to keep her here for serial cardiac enzymes we do not know if her troponin is trending up or down and she refused that.  I then asked her what she would like to do and she states she wants the IV pulled and she wants to leave right now.  She has no chest pain no shortness of breath I think she is capable of understanding the options as they have been explained to her, and she is eager to go home.  I do note that her blood pressure is been in this range for many many years going back at least to 2018 on random visits that I can find, and  so if she is asymptomatic is not exactly clear what our target blood pressure would be in Dr. Mariah Milling and I did discuss that.  However, we do not know if her troponin is 0.25 is trending up or down and I have advised her to release today for that and she refuses.  This is again something that she has the right and capacity to decide however it does limit our ability to take care of her and ensure that she is safe.  She is very comfortable with this.  She wants to leave AMA she states that she will come back if she feels worse and I  did hear discussing this on the phone with her family.    ____________________________________________   FINAL CLINICAL IMPRESSION(S) / ED DIAGNOSES  Final diagnoses:  None      This chart was dictated using voice recognition software.  Despite best efforts to proofread,  errors can occur which can change meaning.      Jeanmarie Plant, MD 10/15/18 1244    Jeanmarie Plant, MD 10/15/18 1443    Jeanmarie Plant, MD 10/15/18 [phone]

## 2018-10-15 NOTE — Discharge Instructions (Addendum)
Have talked about her options today, which would be one being admitted which is the most likely the safest thing for you given your blood pressure and your cardiac enzymes, and your recent stent.  The other option would be to send a second troponin and possibly give you something for your blood pressure here in the emergency room which would keep you here for a few more hours possibly, and the third thing is that you would leave right now AGAINST MEDICAL ADVICE.  As you know, leaving now would limit our ability to continue to monitor you and make sure that you are safe.  Blood pressure remains quite elevated.  You have elected to leave now which is certainly your choice, and we encourage you to return to the emergency room if you change your mind including if you have chest pain especially shortness of breath or other concerns.  Otherwise, please follow with your cardiologist first thing tomorrow or later today if you can.  If at any time you change your mind or feel worse we advise you and encourage you to return to the emergency department

## 2018-10-15 NOTE — ED Notes (Signed)
Informed MD that patient was requesting to leave. MD informed this RN that he was contacting the patient's cardiologist prior to proceeding with the patient's treatment. RN returned to the room and advised the patient that ED MD was contacting her cardiologist. Patient return demonstrated that she understood and would wait for further advise.

## 2018-10-15 NOTE — ED Notes (Signed)
Pt no longer in tx room , IV /saline lock was found in the room intact laying on the counter.   Pt eloped

## 2018-10-15 NOTE — ED Notes (Signed)
The time of this note is approximate. Patient states that she needed to leave "because I have a 38 year old." RN encouraged patient to wait so that we could maintain her safety. Patient stated that she understood. RN informed MD that patient was again requesting to leave.

## 2018-10-17 ENCOUNTER — Encounter: Payer: Self-pay | Admitting: Cardiovascular Disease

## 2018-10-17 ENCOUNTER — Telehealth: Payer: Self-pay | Admitting: *Deleted

## 2018-10-17 ENCOUNTER — Telehealth (INDEPENDENT_AMBULATORY_CARE_PROVIDER_SITE_OTHER): Payer: Self-pay | Admitting: Cardiovascular Disease

## 2018-10-17 ENCOUNTER — Other Ambulatory Visit: Payer: Self-pay

## 2018-10-17 VITALS — BP 195/126 | HR 94 | Ht 60.25 in | Wt 163.0 lb

## 2018-10-17 DIAGNOSIS — Z91120 Patient's intentional underdosing of medication regimen due to financial hardship: Secondary | ICD-10-CM

## 2018-10-17 DIAGNOSIS — I2510 Atherosclerotic heart disease of native coronary artery without angina pectoris: Secondary | ICD-10-CM

## 2018-10-17 DIAGNOSIS — F329 Major depressive disorder, single episode, unspecified: Secondary | ICD-10-CM

## 2018-10-17 DIAGNOSIS — I10 Essential (primary) hypertension: Secondary | ICD-10-CM

## 2018-10-17 DIAGNOSIS — Z955 Presence of coronary angioplasty implant and graft: Secondary | ICD-10-CM

## 2018-10-17 DIAGNOSIS — E785 Hyperlipidemia, unspecified: Secondary | ICD-10-CM

## 2018-10-17 DIAGNOSIS — Z79899 Other long term (current) drug therapy: Secondary | ICD-10-CM

## 2018-10-17 DIAGNOSIS — I252 Old myocardial infarction: Secondary | ICD-10-CM

## 2018-10-17 DIAGNOSIS — Z720 Tobacco use: Secondary | ICD-10-CM

## 2018-10-17 MED ORDER — CLOPIDOGREL BISULFATE 75 MG PO TABS: 75.0000 mg | ORAL_TABLET | Freq: Every day | ORAL | 3 refills | Status: DC

## 2018-10-17 NOTE — Patient Instructions (Signed)
Medication Instructions:  STOP the Brilinta after you have finished you current supply. When the Brilinta is out, then please start the Plavix 75 mg once daily.  START Plavix 75 mg once daily.  If you need a refill on your cardiac medications before your next appointment, please call your pharmacy.   Lab work: None ordered  Testing/Procedures: None ordered  Follow-Up: At [facility], you and your health needs are our priority.  As part of our continuing mission to provide you with exceptional heart care, we have created designated Provider Care Teams.  These Care Teams include your primary Cardiologist (physician) and Advanced Practice Providers (APPs -  Physician Assistants and Nurse Practitioners) who all work together to provide you with the care you need, when you need it. You will need a follow up appointment in 2 weeks. You may see Lorine Bears, MD or one of the following Advanced Practice Providers on your designated Care Team:   Nicolasa Ducking, NP Eula Listen, PA-C . Marisue Ivan, PA-C  Any Other Special Instructions Will Be Listed Below (If Applicable). Please call the [facility] at [phone] to set up an appointment at your convenience or you can fill out an application online. If you need any help with this, please call our office at [phone].

## 2018-10-17 NOTE — Telephone Encounter (Signed)
Patient has been called after her virtual appointment to go over the instructions.  She will discontinue the Brilinta after her current supply is out and start Plavix 75 mg once daily.  She has been given the number to the [facility] to call and set up an appointment. If she needs help with this then she has been advised to reach out to our office.  A copy of the AVS will be mailed to her. She has verbalized her understanding.

## 2018-10-17 NOTE — Progress Notes (Signed)
Virtual Visit via Video Note   This visit type was conducted due to national recommendations for restrictions regarding the COVID-19 Pandemic (e.g. social distancing) in an effort to limit this patient's exposure and mitigate transmission in our community.  Due to her co-morbid illnesses, this patient is at least at moderate risk for complications without adequate follow up.  This format is felt to be most appropriate for this patient at this time.  All issues noted in this document were discussed and addressed.  A limited physical exam was performed with this format.  Please refer to the patient's chart for her consent to telehealth for [facility].   Date:  10/17/2018   ID:  [name], DOB 05-Nov-1980, MRN [id number]  Patient Location: Home Provider Location: Office  PCP:  Titus Mould, NP  Cardiologist:  Lorine Bears, MD  Electrophysiologist:  None   Evaluation Performed:  Follow-Up Visit  Chief Complaint: Depression and elevated blood pressure.  History of Present Illness:    [name] is a 38 y.o. female who was seen via video visit today for follow-up visit after recent hospitalization for non-ST elevation myocardial infarction.  She has known history of coronary artery disease status post RCA stenting x2 in 2013 and 2014, uncontrolled hypertension, hyperlipidemia and obesity. She presented recently to [facility] with chest pain in the setting of uncontrolled hypertension with blood pressure of 240 systolic.  Here EKG showed minor inferior ST elevation.  She continued to have chest pain in spite of controlling her blood pressure and thus I proceeded with urgent cardiac catheterization which showed 95% stenosis in the distal RCA distal to the previously placed stents.  I performed successful angioplasty and drug-eluting stent placement.  The RCA was overall diffusely diseased in the proximal and mid segment.  There was also a 90% stenosis in the left circumflex.  This  was treated with staged PCI. She has not had any recurrent chest pain since then.  However, she was not able to get most of her antihypertensive medications due to cost and went to the emergency room again 2 days ago due to elevated blood pressure.  She was able to get the rest of her blood pressure medications including amlodipine, benazepril and hydrochlorothiazide just yesterday but has not taken the medications this morning as of yet. She is very depressed about her significant other leaving her and moving out.  She is also stressed about her recent myocardial infarction.  She was prescribed Celexa by her primary care physician.  She is trying to quit smoking with nicotine patch.  The patient does not have symptoms concerning for COVID-19 infection (fever, chills, cough, or new shortness of breath).    Past Medical History:  Diagnosis Date   Hyperlipidemia    Hypertension    Myocardial infarction [facility])    2014   Past Surgical History:  Procedure Laterality Date   CARDIAC CATHETERIZATION N/A 10/15/2014   Procedure: Left Heart Cath;  Surgeon: Dalia Heading, MD;  Location: [facility] INVASIVE CV LAB;  Service: Cardiovascular;  Laterality: N/A;   CARDIAC SURGERY  Cardiac stent to right coronary artery   CORONARY STENT INTERVENTION N/A 10/07/2018   Procedure: CORONARY STENT INTERVENTION;  Surgeon: Yvonne Kendall, MD;  Location: [facility] INVASIVE CV LAB;  Service: Cardiovascular;  Laterality: N/A;   CORONARY/GRAFT ACUTE MI REVASCULARIZATION N/A 10/04/2018   Procedure: Coronary/Graft Acute MI Revascularization;  Surgeon: Iran Ouch, MD;  Location: [facility] INVASIVE CV LAB;  Service: Cardiovascular;  Laterality:  N/A;   LEFT HEART CATH AND CORONARY ANGIOGRAPHY N/A 10/04/2018   Procedure: LEFT HEART CATH AND CORONARY ANGIOGRAPHY;  Surgeon: Iran Ouch, MD;  Location: [facility] INVASIVE CV LAB;  Service: Cardiovascular;  Laterality: N/A;   ORIF ANKLE FRACTURE Right 03/01/2015   Procedure: OPEN  REDUCTION INTERNAL FIXATION (ORIF) ANKLE FRACTURE;  Surgeon: Kennedy Bucker, MD;  Location: [facility] ORS;  Service: Orthopedics;  Laterality: Right;   STENT PLACEMENT VASCULAR ([facility] HX)     TUBAL LIGATION  2004     Current Meds  Medication Sig   Albuterol Sulfate (PROAIR RESPICLICK) 108 (90 Base) MCG/ACT AEPB Inhale 2 puffs into the lungs every 6 (six) hours.   amLODipine (NORVASC) 5 MG tablet Take 1 tablet (5 mg total) by mouth daily.   aspirin EC 81 MG tablet Take 1 tablet (81 mg total) by mouth daily.   atorvastatin (LIPITOR) 80 MG tablet Take 1 tablet (80 mg total) by mouth daily.   benazepril (LOTENSIN) 20 MG tablet Take 1 tablet (20 mg total) by mouth daily.   carvedilol (COREG) 6.25 MG tablet Take 1 tablet (6.25 mg total) by mouth 2 (two) times daily with a meal.   citalopram (CELEXA) 20 MG tablet Take 20 mg by mouth daily.    hydrochlorothiazide (HYDRODIURIL) 25 MG tablet Take 1 tablet (25 mg total) by mouth daily.   nitroGLYCERIN (NITROSTAT) 0.4 MG SL tablet Place 1 tablet (0.4 mg total) under the tongue every 5 (five) minutes as needed.   ticagrelor (BRILINTA) 90 MG TABS tablet Take 1 tablet (90 mg total) by mouth 2 (two) times daily.     Allergies:   Patient has no known allergies.   Social History   Tobacco Use   Smoking status: Current Every Day Smoker    Packs/day: 1.00    Types: Cigarettes   Smokeless tobacco: Never Used  Substance Use Topics   Alcohol use: Yes    Comment: occas   Drug use: No     Family Hx: The patient's family history includes Diabetes in her father and mother; Heart attack in her sister; Hypertension in her father and mother.  ROS:   Please see the history of present illness.     All other systems reviewed and are negative.   Prior CV studies:   The following studies were reviewed today: Echocardiogram during admission showed EF of 55 to 60% with no significant valvular abnormalities.   Labs/Other Tests and Data Reviewed:      EKG:  No ECG reviewed.  Recent Labs: 10/04/2018: ALT 11 10/06/2018: Magnesium 2.1 10/15/2018: BUN 20; Creatinine, Ser 0.58; Hemoglobin 13.3; Platelets 313; Potassium 3.8; Sodium 135   Recent Lipid Panel Lab Results  Component Value Date/Time   CHOL 220 (H) 10/06/2018 04:37 AM   CHOL 214 (H) 01/08/2014 07:37 AM   TRIG 201 (H) 10/06/2018 04:37 AM   TRIG 280 (H) 01/08/2014 07:37 AM   HDL 52 10/06/2018 04:37 AM   HDL 42 01/08/2014 07:37 AM   CHOLHDL 4.2 10/06/2018 04:37 AM   LDLCALC 128 (H) 10/06/2018 04:37 AM   LDLCALC 116 (H) 01/08/2014 07:37 AM    Wt Readings from Last 3 Encounters:  10/17/18 163 lb (73.9 kg)  10/15/18 161 lb 9.6 oz (73.3 kg)  10/07/18 161 lb 9.6 oz (73.3 kg)     Objective:    Vital Signs:  BP (!) 195/126 Comment: patient has not taken her meds this am   Pulse 94    Ht 5' 0.25" (1.53  m)    Wt 163 lb (73.9 kg)    LMP 10/08/2018 Comment: pt with tubal ligation 60101yrs ago   BMI 31.57 kg/m    VITAL SIGNS:  reviewed GEN:  no acute distress EYES:  sclerae anicteric, EOMI - Extraocular Movements Intact RESPIRATORY:  normal respiratory effort, symmetric expansion SKIN:  no rash, lesions or ulcers. MUSCULOSKELETAL:  no obvious deformities. NEURO:  alert and oriented x 3, no obvious focal deficit PSYCH:  Flat affect overall  ASSESSMENT & PLAN:    1. Coronary artery disease with recent non-ST elevation myocardial infarction: Status post drug-eluting stent placement to the RCA and left circumflex.  Continue dual antiplatelet therapy for at least 1 year and preferably long-term given multiple stents.  She was able to get Brilinta for free for 1 month but she is not able to get that after this month given that she does not have health insurance.  I am going to switch her to Plavix after she finishes the first month and will try to get her help via medication management.  She asked about return to work and I prefer to have her blood pressure control before she goes.  I will  reevaluate this in 2 weeks. 2. Essential hypertension: Uncontrolled partially due to nonadherence to medications.  She has financial difficulties but was able to get the rest of antihypertensive medications yesterday and will start today. 3. Hyperlipidemia: Continue high-dose atorvastatin.  Target LDL of less than 70. 4. Tobacco use: She is trying to quit with nicotine patch. 5. Depression: She seems to be depressed and was prescribed Celexa by her primary care physician.  COVID-19 Education: The signs and symptoms of COVID-19 were discussed with the patient and how to seek care for testing (follow up with PCP or arrange E-visit).  The importance of social distancing was discussed today.  Time:   Today, I have spent 12 minutes with the patient with telehealth technology discussing the above problems.     Medication Adjustments/Labs and Tests Ordered: Current medicines are reviewed at length with the patient today.  Concerns regarding medicines are outlined above.   Tests Ordered: No orders of the defined types were placed in this encounter.   Medication Changes: No orders of the defined types were placed in this encounter.   Disposition:  Follow up in 2 week(s)  Signed, Lorine BearsMuhammad Cristina Mattern, MD  10/17/2018 9:06 AM     [facility]

## 2018-10-21 ENCOUNTER — Telehealth: Payer: Self-pay | Admitting: Cardiovascular Disease

## 2018-10-21 NOTE — Telephone Encounter (Signed)
LMOV to schedule 2 week follow up from 10/17/2018

## 2018-10-31 ENCOUNTER — Telehealth (INDEPENDENT_AMBULATORY_CARE_PROVIDER_SITE_OTHER): Payer: Self-pay | Admitting: Cardiovascular Disease

## 2018-10-31 ENCOUNTER — Encounter: Payer: Self-pay | Admitting: Cardiovascular Disease

## 2018-10-31 ENCOUNTER — Other Ambulatory Visit: Payer: Self-pay

## 2018-10-31 VITALS — BP 181/96 | HR 112 | Ht 60.0 in | Wt 163.0 lb

## 2018-10-31 DIAGNOSIS — I10 Essential (primary) hypertension: Secondary | ICD-10-CM

## 2018-10-31 DIAGNOSIS — I2510 Atherosclerotic heart disease of native coronary artery without angina pectoris: Secondary | ICD-10-CM

## 2018-10-31 MED ORDER — CARVEDILOL 12.5 MG PO TABS: 12.5000 mg | ORAL_TABLET | Freq: Two times a day (BID) | ORAL | 6 refills | Status: DC

## 2018-10-31 MED ORDER — CLOPIDOGREL BISULFATE 75 MG PO TABS: 75.0000 mg | ORAL_TABLET | Freq: Every day | ORAL | 6 refills | Status: AC

## 2018-10-31 NOTE — Progress Notes (Signed)
Virtual Visit via Video Note   This visit type was conducted due to national recommendations for restrictions regarding the COVID-19 Pandemic (e.g. social distancing) in an effort to limit this patient's exposure and mitigate transmission in our community.  Due to her co-morbid illnesses, this patient is at least at moderate risk for complications without adequate follow up.  This format is felt to be most appropriate for this patient at this time.  All issues noted in this document were discussed and addressed.  A limited physical exam was performed with this format.  Please refer to the patient's chart for her consent to telehealth for [facility].   Date:  10/31/2018   ID:  [name], DOB [birth year], MRN [id number]  Patient Location: Home Provider Location: Office  PCP:  Titus Mould, NP  Cardiologist:  Lorine Bears, MD  Electrophysiologist:  None   Evaluation Performed:  Follow-Up Visit  Chief Complaint:  elevated blood pressure.  History of Present Illness:    [name] is a 38 y.o. female who was seen via video visit today for follow-up visit after recent hospitalization for non-ST elevation myocardial infarction.  She has known history of coronary artery disease status post RCA stenting x2 in 2013 and 2014, uncontrolled hypertension, hyperlipidemia and obesity. She presented recently to [facility] with chest pain in the setting of uncontrolled hypertension with blood pressure of 240 systolic.  Here EKG showed minor inferior ST elevation.  She continued to have chest pain in spite of controlling her blood pressure and thus I proceeded with urgent cardiac catheterization which showed 95% stenosis in the distal RCA distal to the previously placed stents.  I performed successful angioplasty and drug-eluting stent placement.  The RCA was overall diffusely diseased in the proximal and mid segment.  There was also a 90% stenosis in the left circumflex.  This was treated with  staged PCI.  No recurrent angina since then but we have been having difficulty controlling her blood pressure.  Initially, she was not able to get her medications.  She was able to get some from [facility] and has been taking the medications.  Blood pressure continues to be elevated.  I am still concerned about possible compliance as her blood pressure was reasonably controlled while hospitalized. She was able to get nicotine patches and she is trying to quit smoking.  She wants to be cleared to go back to work but I have explained to her that we need to get her systolic blood pressure below 578.  The patient does not have symptoms concerning for COVID-19 infection (fever, chills, cough, or new shortness of breath).    Past Medical History:  Diagnosis Date  . Hyperlipidemia   . Hypertension   . Myocardial infarction [facility])    2014   Past Surgical History:  Procedure Laterality Date  . CARDIAC CATHETERIZATION N/A 10/15/2014   Procedure: Left Heart Cath;  Surgeon: Dalia Heading, MD;  Location: [facility] INVASIVE CV LAB;  Service: Cardiovascular;  Laterality: N/A;  . CARDIAC SURGERY  Cardiac stent to right coronary artery  . CORONARY STENT INTERVENTION N/A 10/07/2018   Procedure: CORONARY STENT INTERVENTION;  Surgeon: Yvonne Kendall, MD;  Location: [facility] INVASIVE CV LAB;  Service: Cardiovascular;  Laterality: N/A;  . CORONARY/GRAFT ACUTE MI REVASCULARIZATION N/A 10/04/2018   Procedure: Coronary/Graft Acute MI Revascularization;  Surgeon: Iran Ouch, MD;  Location: [facility] INVASIVE CV LAB;  Service: Cardiovascular;  Laterality: N/A;  . LEFT HEART CATH AND CORONARY ANGIOGRAPHY  N/A 10/04/2018   Procedure: LEFT HEART CATH AND CORONARY ANGIOGRAPHY;  Surgeon: Iran OuchArida, Zylon Creamer A, MD;  Location: [facility] INVASIVE CV LAB;  Service: Cardiovascular;  Laterality: N/A;  . ORIF ANKLE FRACTURE Right 03/01/2015   Procedure: OPEN REDUCTION INTERNAL FIXATION (ORIF) ANKLE FRACTURE;  Surgeon: Kennedy BuckerMichael Menz, MD;  Location: [facility]  ORS;  Service: Orthopedics;  Laterality: Right;  . STENT PLACEMENT VASCULAR ([facility] HX)    . TUBAL LIGATION  2004     Current Meds  Medication Sig  . Albuterol Sulfate (PROAIR RESPICLICK) 108 (90 Base) MCG/ACT AEPB Inhale 2 puffs into the lungs every 6 (six) hours.  Marland Kitchen. amLODipine (NORVASC) 5 MG tablet Take 1 tablet (5 mg total) by mouth daily.  Marland Kitchen. aspirin EC 81 MG tablet Take 1 tablet (81 mg total) by mouth daily.  Marland Kitchen. atorvastatin (LIPITOR) 80 MG tablet Take 1 tablet (80 mg total) by mouth daily.  . benazepril (LOTENSIN) 20 MG tablet Take 1 tablet (20 mg total) by mouth daily.  . carvedilol (COREG) 6.25 MG tablet Take 1 tablet (6.25 mg total) by mouth 2 (two) times daily with a meal.  . citalopram (CELEXA) 20 MG tablet Take 20 mg by mouth daily.   . clopidogrel (PLAVIX) 75 MG tablet Take 1 tablet (75 mg total) by mouth daily.  . hydrochlorothiazide (HYDRODIURIL) 25 MG tablet Take 1 tablet (25 mg total) by mouth daily.  . nicotine (NICODERM CQ) 21 mg/24hr patch Place 1 patch (21 mg total) onto the skin daily.  . nitroGLYCERIN (NITROSTAT) 0.4 MG SL tablet Place 1 tablet (0.4 mg total) under the tongue every 5 (five) minutes as needed.     Allergies:   Patient has no known allergies.   Social History   Tobacco Use  . Smoking status: Current Every Day Smoker    Packs/day: 1.00    Types: Cigarettes  . Smokeless tobacco: Never Used  Substance Use Topics  . Alcohol use: Yes    Comment: occas  . Drug use: No     Family Hx: The patient's family history includes Diabetes in her father and mother; Heart attack in her sister; Hypertension in her father and mother.  ROS:   Please see the history of present illness.     All other systems reviewed and are negative.   Prior CV studies:   The following studies were reviewed today: Echocardiogram during admission showed EF of 55 to 60% with no significant valvular abnormalities.   Labs/Other Tests and Data Reviewed:    EKG:  No ECG  reviewed.  Recent Labs: 10/04/2018: ALT 11 10/06/2018: Magnesium 2.1 10/15/2018: BUN 20; Creatinine, Ser 0.58; Hemoglobin 13.3; Platelets 313; Potassium 3.8; Sodium 135   Recent Lipid Panel Lab Results  Component Value Date/Time   CHOL 220 (H) 10/06/2018 04:37 AM   CHOL 214 (H) 01/08/2014 07:37 AM   TRIG 201 (H) 10/06/2018 04:37 AM   TRIG 280 (H) 01/08/2014 07:37 AM   HDL 52 10/06/2018 04:37 AM   HDL 42 01/08/2014 07:37 AM   CHOLHDL 4.2 10/06/2018 04:37 AM   LDLCALC 128 (H) 10/06/2018 04:37 AM   LDLCALC 116 (H) 01/08/2014 07:37 AM    Wt Readings from Last 3 Encounters:  10/31/18 163 lb (73.9 kg)  10/17/18 163 lb (73.9 kg)  10/15/18 161 lb 9.6 oz (73.3 kg)     Objective:    Vital Signs:  BP (!) 181/96   Pulse (!) 112   Ht 5' (1.524 m)   Wt 163 lb (73.9 kg)  LMP 10/08/2018 Comment: pt with tubal ligation 72yrs ago  BMI 31.83 kg/m    VITAL SIGNS:  reviewed GEN:  no acute distress EYES:  sclerae anicteric, EOMI - Extraocular Movements Intact RESPIRATORY:  normal respiratory effort, symmetric expansion SKIN:  no rash, lesions or ulcers. MUSCULOSKELETAL:  no obvious deformities. NEURO:  alert and oriented x 3, no obvious focal deficit PSYCH:  Flat affect overall  ASSESSMENT & PLAN:    1. Coronary artery disease with recent non-ST elevation myocardial infarction: Status post drug-eluting stent placement to the RCA and left circumflex.  Continue dual antiplatelet therapy for at least 1 year and preferably long-term given multiple stents.  She was able to get Brilinta for free for 1 month but she is not able to get that after this month given that she does not have health insurance.  We switched her to Plavix 75 mg once daily.  2. Essential hypertension: Uncontrolled partially due to nonadherence to medications.  I increased carvedilol today to 12.5 mg twice daily.  I asked her to monitor her blood pressure regularly and let us know if it does not come down. 3. Hyperlipidemia:  Continue high-dose atorvastatin.  Target LDL of less than 70. 4. Tobacco use: She is trying to quit with nicotine patch. 5. Return to work: Reevaluate in 2 weeks to see if systolic blood pressure is below 160.  COVID-19 Education: The signs and symptoms of COVID-19 were discussed with the patient and how to seek care for testing (follow up with PCP or arrange E-visit).  The importance of social distancing was discussed today.  Time:   Today, I have spent 8 minutes with the patient with telehealth technology discussing the above problems.     Medication Adjustments/Labs and Tests Ordered: Current medicines are reviewed at length with the patient today.  Concerns regarding medicines are outlined above.   Tests Ordered: No orders of the defined types were placed in this encounter.   Medication Changes: No orders of the defined types were placed in this encounter.   Disposition:  Follow up in 2 week(s)  Signed, Lorine Bears, MD  10/31/2018 8:39 AM    [facility]

## 2018-10-31 NOTE — Patient Instructions (Signed)
Medication Instructions:  Increase carvedilol to 12.5 mg twice daily.  Continue other medications If you need a refill on your cardiac medications before your next appointment, please call your pharmacy.   Lab work: None If you have labs (blood work) drawn today and your tests are completely normal, you will receive your results only by: Marland Kitchen MyChart Message (if you have MyChart) OR . A paper copy in the mail If you have any lab test that is abnormal or we need to change your treatment, we will call you to review the results.  Testing/Procedures: None  Follow-Up: Virtual follow-up visit with Dr. Kirke Corin in 2 weeks

## 2018-11-07 ENCOUNTER — Telehealth: Payer: Self-pay | Admitting: Cardiovascular Disease

## 2018-11-07 NOTE — Telephone Encounter (Signed)
Recieved request from : [place] ° °Forwarded to [employer] for processing ° °

## 2018-11-26 NOTE — Progress Notes (Signed)
Virtual Visit via Video Note   This visit type was conducted due to national recommendations for restrictions regarding the COVID-19 Pandemic (e.g. social distancing) in an effort to limit this patient's exposure and mitigate transmission in our community.  Due to her co-morbid illnesses, this patient is at least at moderate risk for complications without adequate follow up.  This format is felt to be most appropriate for this patient at this time.  All issues noted in this document were discussed and addressed.  A limited physical exam was performed with this format.  Please refer to the patient's chart for her consent to telehealth for [facility].   Date:  11/27/2018   ID:  Monica Andaina H Popson, DOB 07/04/1980, MRN [id number]  Patient Location: Other:  Daughter's house Provider Location: Office  PCP:  Titus MouldWhite, Elizabeth Burney, NP  Cardiologist:  Lorine BearsMuhammad Arida, MD  Electrophysiologist:  None   Evaluation Performed:  Follow-Up Visit  Chief Complaint:  Follow up  History of Present Illness:    [name] is a 38 y.o. female with history of CAD status post RCA stenting x2 in 2013 and 2014 with recent non-STEMI in 09/2018 status post PCI as outlined below, uncontrolled hypertension, hyperlipidemia, tobacco abuse, and obesity who presents for follow-up of her hypertension.  Patient was recently admitted to the hospital in 09/2018 with chest pain in the setting of uncontrolled hypertension with systolic blood pressure of 240 mmHg.  EKG showed minor inferior ST segment elevation.  She continued to have chest pain in spite of controlling her blood pressure and underwent urgent cardiac cath which showed 95% stenosis in the distal RCA which was distal to the previously placed stent.  She underwent successful PCI/DES.  The RCA was overall diffusely diseased in the proximal and mid segments.  There was also a 90% stenosis in the LCx which was treated successfully with staged PCI.  She was seen most  recently in telehealth follow-up on 10/31/2018 noting difficulty in controlling her blood pressure.  Initially, she was not able to get her medications but was subsequently able to get them from [facility].  There remained concerned about possible compliance issues as her blood pressure was reasonably controlled while admitted.  She was able to obtain nicotine patches and was attempting smoking cessation.  She wanted to be cleared to go back to work however it was explained to her that her blood pressure would need to be below 160 systolic prior to returning back to work.  Patient documented blood pressure was noted to be 181/96 with a heart rate of 112 bpm at that virtual visit.  Given financial constraints she was transitioned from Brilinta to Plavix at that visit with recommendation to continue dual antiplatelet therapy for at least the next 12 months.  Her carvedilol was increased to 12.5 mg twice daily, otherwise she was continued on amlodipine 5 mg daily, benazepril 20 mg daily, HCTZ 25 mg daily, dual antiplatelet therapy with aspirin and Plavix, and atorvastatin 80 mg daily.  Patient is seen in virtual follow-up today to discuss her hypertension.  Unfortunately, the patient has not been checking her blood pressure at home.  She is currently at her daughter's house and does not have access to a BP cuff.  In this setting, I have asked that we postpone her virtual visit and have her come in for an in person visit next week to evaluate her blood pressure objectively.  She agrees to this.  Labs: 09/2018 - WBC 9.3, Hgb  13.3, PLT 313, potassium 3.8, serum creatinine 0.58, LDL 128, magnesium 2.1, AST/ALT normal  The patient does not have symptoms concerning for COVID-19 infection (fever, chills, cough, or new shortness of breath).    Past Medical History:  Diagnosis Date  . Hyperlipidemia   . Hypertension   . Myocardial infarction [facility])    2014   Past Surgical History:  Procedure Laterality Date  .  CARDIAC CATHETERIZATION N/A 10/15/2014   Procedure: Left Heart Cath;  Surgeon: Teodoro Spray, MD;  Location: [place] CV LAB;  Service: Cardiovascular;  Laterality: N/A;  . CARDIAC SURGERY  Cardiac stent to right coronary artery  . CORONARY STENT INTERVENTION N/A 10/07/2018   Procedure: CORONARY STENT INTERVENTION;  Surgeon: Nelva Bush, MD;  Location: [place] [facility] LAB;  Service: Cardiovascular;  Laterality: N/A;  . CORONARY/GRAFT ACUTE MI REVASCULARIZATION N/A 10/04/2018   Procedure: Coronary/Graft Acute MI Revascularization;  Surgeon: Wellington Hampshire, MD;  Location: [place] CV LAB;  Service: Cardiovascular;  Laterality: N/A;  . LEFT HEART CATH AND CORONARY ANGIOGRAPHY N/A 10/04/2018   Procedure: LEFT HEART CATH AND CORONARY ANGIOGRAPHY;  Surgeon: Wellington Hampshire, MD;  Location: [place] CV LAB;  Service: Cardiovascular;  Laterality: N/A;  . ORIF ANKLE FRACTURE Right 03/01/2015   Procedure: OPEN REDUCTION INTERNAL FIXATION (ORIF) ANKLE FRACTURE;  Surgeon: Hessie Knows, MD;  Location: [facility] ORS;  Service: Orthopedics;  Laterality: Right;  . STENT PLACEMENT VASCULAR ([place] HX)    . TUBAL LIGATION  2004     Current Meds  Medication Sig  . Albuterol Sulfate (PROAIR RESPICLICK) 426 (90 Base) MCG/ACT AEPB Inhale 2 puffs into the lungs every 6 (six) hours.  Marland Kitchen amLODipine (NORVASC) 5 MG tablet Take 1 tablet (5 mg total) by mouth daily.  Marland Kitchen aspirin EC 81 MG tablet Take 1 tablet (81 mg total) by mouth daily.  Marland Kitchen atorvastatin (LIPITOR) 80 MG tablet Take 1 tablet (80 mg total) by mouth daily.  . benazepril (LOTENSIN) 20 MG tablet Take 1 tablet (20 mg total) by mouth daily.  . carvedilol (COREG) 12.5 MG tablet Take 1 tablet (12.5 mg total) by mouth 2 (two) times daily with a meal.  . citalopram (CELEXA) 20 MG tablet Take 20 mg by mouth daily.   . clopidogrel (PLAVIX) 75 MG tablet Take 1 tablet (75 mg total) by mouth daily.  . hydrochlorothiazide (HYDRODIURIL) 25 MG tablet Take 1 tablet  (25 mg total) by mouth daily.  . nicotine (NICODERM CQ) 21 mg/24hr patch Place 1 patch (21 mg total) onto the skin daily.  . nitroGLYCERIN (NITROSTAT) 0.4 MG SL tablet Place 1 tablet (0.4 mg total) under the tongue every 5 (five) minutes as needed.     Allergies:   Patient has no known allergies.   Social History   Tobacco Use  . Smoking status: Current Every Day Smoker    Packs/day: 1.00    Types: Cigarettes  . Smokeless tobacco: Never Used  Substance Use Topics  . Alcohol use: Yes    Comment: occas  . Drug use: No     Family Hx: The patient's family history includes Diabetes in her father and mother; Heart attack in her sister; Hypertension in her father and mother.  ROS:   Please see the history of present illness.     All other systems reviewed and are negative.   Prior CV studies:   The following studies were reviewed today:  LHC 10/04/2018:  The left ventricular systolic function is normal.  LV  end diastolic pressure is mildly elevated.  The left ventricular ejection fraction is 55-65% by visual estimate.  Mid RCA lesion is 30% stenosed.  Prox RCA to Mid RCA lesion is 70% stenosed.  Ost RCA to Prox RCA lesion is 40% stenosed.  Dist RCA lesion is 95% stenosed.  Post intervention, there is a 0% residual stenosis.  A drug-eluting stent was successfully placed using a STENT RESOLUTE ONYX 2.5X15.  Mid Cx lesion is 90% stenosed.  Ost LAD lesion is 20% stenosed.   1.  Severe two-vessel coronary artery disease.  The culprit seems to be severe stenosis in the distal right coronary artery.  The stents in the mid to distal RCA are patent with mild to moderate in-stent restenosis and borderline significant stenosis proximal to the stent.  There is also severe stenosis in the mid left circumflex. 2.  Normal LV systolic function.   3.Severely elevated blood pressure with an LVEDP of 22 mmHg. 4.  Successful angioplasty and drug-eluting stent placement to the distal  right coronary artery.  Recommendations: The patient presented with chest pain and borderline inferior ST elevation on EKG.  However, she was extremely hypertensive by EMS with systolic blood pressure of 240.  Previous EKG also showed old inferior infarct.  Due to that, I elected to delay Cath Lab activation in order to control blood pressure and evaluate her response and EKG changes.  The patient continued to have 5 out of 10 chest pain and EKG continued to show mild inferior ST elevation and thus urgent cardiac catheterization was performed.  Continue dual antiplatelet therapy indefinitely given multiple drug-eluting stents. The patient needs aggressive control of blood pressure and risk factors. Recommend staged left circumflex PCI on Monday or Tuesday. __________  PCI 10/07/2018: Conclusions: 1. Mild, non-obstructive disease involving the LAD. 2. 90% mid LCx stenosis extending into OM2. 3. Successful PCI to mid LCx/OM2 using Resolute Onyx 2.25 x 18 mm DES with 0% residual stenosis and TIMI-3 flow.  Recommendations: 1. Continue at least 12 months of DAPT with ASA and ticagrelor. 2. Aggressive secondary prevention. 3. Likely discharge home tomorrow. __________  2D Echo 10/05/2018: 1. The left ventricle has normal systolic function, with an ejection fraction of 55-60%. The cavity size was normal. There is severe concentric left ventricular hypertrophy. Left ventricular diastolic Doppler parameters are indeterminate.  2. The right ventricle has normal systolic function. The cavity was normal. There is no increase in right ventricular wall thickness. Right ventricular systolic pressure could not be assessed.  3. The tricuspid valve is grossly normal.  4. The aortic valve is grossly normal. Aortic valve regurgitation was not assessed by color flow Doppler.  Labs/Other Tests and Data Reviewed:    EKG:  No ECG reviewed.  Recent Labs: 10/04/2018: ALT 11 10/06/2018: Magnesium 2.1  10/15/2018: BUN 20; Creatinine, Ser 0.58; Hemoglobin 13.3; Platelets 313; Potassium 3.8; Sodium 135   Recent Lipid Panel Lab Results  Component Value Date/Time   CHOL 220 (H) 10/06/2018 04:37 AM   CHOL 214 (H) 01/08/2014 07:37 AM   TRIG 201 (H) 10/06/2018 04:37 AM   TRIG 280 (H) 01/08/2014 07:37 AM   HDL 52 10/06/2018 04:37 AM   HDL 42 01/08/2014 07:37 AM   CHOLHDL 4.2 10/06/2018 04:37 AM   LDLCALC 128 (H) 10/06/2018 04:37 AM   LDLCALC 116 (H) 01/08/2014 07:37 AM    Wt Readings from Last 3 Encounters:  10/31/18 163 lb (73.9 kg)  10/17/18 163 lb (73.9 kg)  10/15/18 161 lb 9.6  oz (73.3 kg)     Objective:    Vital Signs:  Ht 5' 0.25" (1.53 m)   BMI 31.57 kg/m    VITAL SIGNS:  reviewed  ASSESSMENT & PLAN:    1. Hypertension: This was strictly a blood pressure follow-up and the patient does not have any recent blood pressure readings for review.  In this setting, I have canceled our visit and recommended that she come in for an in person visit next week for objective blood pressure visit.  2. CAD: This was not discussed in our brief virtual visit today.  We will revisit this next week during her in person visit.  Patient will not be charged for today's visit.  COVID-19 Education: The signs and symptoms of COVID-19 were discussed with the patient and how to seek care for testing (follow up with PCP or arrange E-visit).  The importance of social distancing was discussed today.  Time:   Today, I have spent 5 minutes with the patient with telehealth technology discussing the above problems.     Medication Adjustments/Labs and Tests Ordered: Current medicines are reviewed at length with the patient today.  Concerns regarding medicines are outlined above.   Tests Ordered: No orders of the defined types were placed in this encounter.   Medication Changes: No orders of the defined types were placed in this encounter.   Follow Up:  In Person in 1 week(s)  Signed, Eula ListenRyan  Lanai Conlee, PA-C  11/27/2018 1:32 PM    [facility]

## 2018-11-27 ENCOUNTER — Telehealth: Payer: Self-pay | Admitting: Physician Assistant

## 2018-11-27 ENCOUNTER — Encounter: Payer: Self-pay | Admitting: Physician Assistant

## 2018-11-27 ENCOUNTER — Other Ambulatory Visit: Payer: Self-pay

## 2018-11-27 VITALS — Ht 60.25 in

## 2018-11-27 NOTE — Telephone Encounter (Signed)
Received forms for physician to complete, placed in nurses box 

## 2018-11-27 NOTE — Telephone Encounter (Signed)
FMLA/Disability forms place in Dr.Arida's in box to be signed.

## 2018-11-27 NOTE — Patient Instructions (Signed)
Appointment canceled and needs to be rescheduled for in-person visit next week.

## 2018-11-27 NOTE — Progress Notes (Signed)
Scheduled

## 2018-11-30 NOTE — Progress Notes (Signed)
Cardiology Office Note Date:  12/02/2018  Patient ID:  Monica, Meza 10-Mar-1981, MRN [id number] PCP:  Ricardo Jericho, NP  Cardiologist:  Dr. Fletcher Anon, MD    Chief Complaint: BP follow up  History of Present Illness: [name] is a 38 y.o. female with history of CAD status post RCA stenting x2 in 2013 and 2014 with recent non-STEMI in 09/2018 status post PCI as outlined below, uncontrolled hypertension, hyperlipidemia, tobacco abuse, and obesity who presents for follow-up of her hypertension.  Patient was recently admitted to the hospital in 09/2018 with chest pain in the setting of uncontrolled hypertension with systolic blood pressure of 240 mmHg.  EKG showed minor inferior ST segment elevation.  She continued to have chest pain in spite of controlling her blood pressure and underwent urgent cardiac cath which showed 95% stenosis in the distal RCA which was distal to the previously placed stent.  She underwent successful PCI/DES.  The RCA was overall diffusely diseased in the proximal and mid segments.  There was also a 90% stenosis in the LCx which was treated successfully with staged PCI.  She was seen in telehealth follow-up on 10/31/2018 noting difficulty in controlling her blood pressure.  Initially, she was not able to get her medications but was subsequently able to get them from [place].  There remained concern about possible compliance issues as her blood pressure was reasonably controlled while admitted.  She was able to obtain nicotine patches and was attempting smoking cessation.  She wanted to be cleared to go back to work however it was explained to her that her blood pressure would need to be below 814 systolic prior to returning back to work.  Patient documented blood pressure was noted to be 181/96 with a heart rate of 112 bpm at that virtual visit.  Given financial constraints she was transitioned from Brilinta to Plavix at that visit with recommendation to continue dual  antiplatelet therapy for at least the next 12 months.  Her carvedilol was increased to 12.5 mg twice daily, otherwise she was continued on amlodipine 5 mg daily, benazepril 20 mg daily, HCTZ 25 mg daily, dual antiplatelet therapy with aspirin and Plavix, and atorvastatin 80 mg daily.  She was scheduled for telehealth follow up of her HTN on 7/2, though did not have any BP readings for review.  In this setting, her appointment was postponed and scheduled for in person visit today.   She comes in doing reasonably well from a cardiac perspective.  She denies any chest pain, shortness of breath, palpitations, dizziness, presyncope, or syncope.  No lower extremity swelling, abdominal distention, orthopnea, PND, early satiety.  She reports compliance with all medications including dual antiplatelet therapy and denies missing any doses.  She denies any falls, BRBPR, or melena.  She states the most recent home blood pressure was obtained on 11/29/2018 with a reading of 160/113.  She reports compliance with all antihypertensive medication.  She states both her mother and father were diagnosed with hypertension at a young age.  Patient reports her hypertension was diagnosed at age 28 in the setting of pregnancy and persisted following delivery.  She does note some increased fatigue only when walking outside in the heat and is asymptomatic while in air conditioning.  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  Labs: 09/2018 - WBC 9.3, Hgb 13.3, PLT 313, potassium 3.8, serum creatinine 0.58, LDL 128, magnesium 2.1, AST/ALT normal  Past Medical History:  Diagnosis Date   Hyperlipidemia    Hypertension    Myocardial infarction [facility](HCC)    2014    Past Surgical History:  Procedure Laterality Date   CARDIAC CATHETERIZATION N/A 10/15/2014   Procedure: Left Heart Cath;  Surgeon: Dalia HeadingKenneth A Fath, MD;  Location: [facility] INVASIVE CV LAB;  Service: Cardiovascular;  Laterality:  N/A;   CARDIAC SURGERY  Cardiac stent to right coronary artery   CORONARY STENT INTERVENTION N/A 10/07/2018   Procedure: CORONARY STENT INTERVENTION;  Surgeon: Yvonne KendallEnd, Christopher, MD;  Location: [facility] INVASIVE CV LAB;  Service: Cardiovascular;  Laterality: N/A;   CORONARY/GRAFT ACUTE MI REVASCULARIZATION N/A 10/04/2018   Procedure: Coronary/Graft Acute MI Revascularization;  Surgeon: Iran OuchArida, Muhammad A, MD;  Location: [facility] INVASIVE CV LAB;  Service: Cardiovascular;  Laterality: N/A;   LEFT HEART CATH AND CORONARY ANGIOGRAPHY N/A 10/04/2018   Procedure: LEFT HEART CATH AND CORONARY ANGIOGRAPHY;  Surgeon: Iran OuchArida, Muhammad A, MD;  Location: [facility] INVASIVE CV LAB;  Service: Cardiovascular;  Laterality: N/A;   ORIF ANKLE FRACTURE Right 03/01/2015   Procedure: OPEN REDUCTION INTERNAL FIXATION (ORIF) ANKLE FRACTURE;  Surgeon: Kennedy BuckerMichael Menz, MD;  Location: [facility] ORS;  Service: Orthopedics;  Laterality: Right;   STENT PLACEMENT VASCULAR ([facility] HX)     TUBAL LIGATION  2004    Current Meds  Medication Sig   Albuterol Sulfate (PROAIR RESPICLICK) 108 (90 Base) MCG/ACT AEPB Inhale 2 puffs into the lungs every 6 (six) hours.   amLODipine (NORVASC) 5 MG tablet Take 1 tablet (5 mg total) by mouth daily.   aspirin EC 81 MG tablet Take 1 tablet (81 mg total) by mouth daily.   atorvastatin (LIPITOR) 80 MG tablet Take 1 tablet (80 mg total) by mouth daily.   benazepril (LOTENSIN) 20 MG tablet Take 1 tablet (20 mg total) by mouth daily.   carvedilol (COREG) 12.5 MG tablet Take 1 tablet (12.5 mg total) by mouth 2 (two) times daily with a meal.   citalopram (CELEXA) 20 MG tablet Take 20 mg by mouth daily.    clopidogrel (PLAVIX) 75 MG tablet Take 1 tablet (75 mg total) by mouth daily.   hydrochlorothiazide (HYDRODIURIL) 25 MG tablet Take 1 tablet (25 mg total) by mouth daily.   nicotine (NICODERM CQ) 21 mg/24hr patch Place 1 patch (21 mg total) onto the skin daily.   nitroGLYCERIN (NITROSTAT) 0.4 MG SL tablet  Place 1 tablet (0.4 mg total) under the tongue every 5 (five) minutes as needed.    Allergies:   Patient has no known allergies.   Social History:  The patient  reports that she has been smoking cigarettes. She has been smoking about 1.00 pack per day. She has never used smokeless tobacco. She reports current alcohol use. She reports that she does not use drugs.   Family History:  The patient's family history includes Diabetes in her father and mother; Heart attack in her sister; Hypertension in her father and mother.  ROS:   Review of Systems  Constitutional: Positive for malaise/fatigue. Negative for chills, diaphoresis, fever and weight loss.  HENT: Negative for congestion.   Eyes: Negative for discharge and redness.  Respiratory: Negative for cough, hemoptysis, sputum production, shortness of breath and wheezing.   Cardiovascular: Negative for chest pain, palpitations, orthopnea, claudication, leg swelling and PND.  Gastrointestinal: Negative for abdominal pain, blood in stool, heartburn, melena, nausea and vomiting.  Genitourinary: Negative for hematuria.  Musculoskeletal: Negative for falls and myalgias.  Skin: Negative for rash.  Neurological: Negative  for dizziness, tingling, tremors, sensory change, speech change, focal weakness, loss of consciousness and weakness.  Endo/Heme/Allergies: Does not bruise/bleed easily.  Psychiatric/Behavioral: Negative for substance abuse. The patient is not nervous/anxious.   All other systems reviewed and are negative.    PHYSICAL EXAM:  VS:  BP (!) 130/100 (BP Location: Left Arm, Patient Position: Sitting, Cuff Size: Normal)    Pulse 93    Ht 5' 0.25" (1.53 m)    Wt 164 lb 12 oz (74.7 kg)    SpO2 98%    BMI 31.91 kg/m  BMI: Body mass index is 31.91 kg/m.  Physical Exam  Constitutional: She is oriented to person, place, and time. She appears well-developed and well-nourished.  HENT:  Head: Normocephalic and atraumatic.  Eyes: Right eye  exhibits no discharge. Left eye exhibits no discharge.  Neck: Normal range of motion. No JVD present.  Cardiovascular: Normal rate, regular rhythm, S1 normal, S2 normal and normal heart sounds. Exam reveals no distant heart sounds, no friction rub, no midsystolic click and no opening snap.  No murmur heard. Pulses:      Posterior tibial pulses are 2+ on the right side and 2+ on the left side.  Pulmonary/Chest: Effort normal and breath sounds normal. No respiratory distress. She has no decreased breath sounds. She has no wheezes. She has no rales. She exhibits no tenderness.  Abdominal: Soft. She exhibits no distension. There is no abdominal tenderness.  Musculoskeletal:        General: No edema.  Neurological: She is alert and oriented to person, place, and time.  Skin: Skin is warm and dry. No cyanosis. Nails show no clubbing.  Psychiatric: She has a normal mood and affect. Her speech is normal and behavior is normal. Judgment and thought content normal.     EKG:  Was ordered and interpreted by me today. Shows NSR, 93 bpm, PR interval 106 ms without evidence of delta wave, prior inferior infarct, T wave inversion along leads I, II, aVL, V4, V5, V6  Recent Labs: 10/04/2018: ALT 11 10/06/2018: Magnesium 2.1 10/15/2018: BUN 20; Creatinine, Ser 0.58; Hemoglobin 13.3; Platelets 313; Potassium 3.8; Sodium 135  10/06/2018: Cholesterol 220; HDL 52; LDL Cholesterol 128; Total CHOL/HDL Ratio 4.2; Triglycerides 201; VLDL 40   CrCl cannot be calculated (Patient's most recent lab result is older than the maximum 21 days allowed.).   Wt Readings from Last 3 Encounters:  12/02/18 164 lb 12 oz (74.7 kg)  10/31/18 163 lb (73.9 kg)  10/17/18 163 lb (73.9 kg)     Other studies reviewed: Additional studies/records reviewed today include: summarized above  ASSESSMENT AND PLAN:  1. CAD involving the native coronary arteries with recent non-STEMI without angina: She is doing well from a cardiac perspective  without any symptoms concerning for angina.  Continue dual antiplatelet therapy with aspirin 81 mg daily and Plavix 75 mg daily without interruption through at least 09/2019.  Continue Coreg and Lipitor as outlined below.  Aggressive risk factor modification and secondary prevention.  No plans for further ischemic evaluation at this time.  2. Essential hypertension: Blood pressure is improved today though still remains mildly elevated at 130/100.  Most recent home blood pressure reading from 7/4 of 160/113.  She reports compliance with all medications.  We will titrate her amlodipine to 10 mg daily and otherwise keep her on current doses of benazepril 20 mg daily, Coreg 12.5 mg twice daily, and hydrochlorothiazide 25 mg daily.  We will obtain a renal artery ultrasound  to evaluate for renal artery stenosis.  Could consider further laboratory evaluation including cortisol level and catecholamines/metanephrines in follow-up.  3. Hyperlipidemia: LDL of 128 from 09/2018.  Remains on Lipitor.  Plan to recheck fasting lipid panel at next visit.  Goal LDL less than 70.  4. Tobacco abuse: She continues to wear nicotine patches.  She is down from 1 pack/day to half pack per day.  5. Work status: I recommend she continue to be out of work at least through the next 2 weeks until her next visit in an effort for us to further optimize her blood pressure regimen.  Disposition: F/u with Dr. Kirke CorinArida or an APP in 2 weeks.  Current medicines are reviewed at length with the patient today.  The patient did not have any concerns regarding medicines.  Signed, Eula Listenyan Brevon Dewald, PA-C 12/02/2018 2:45 PM     [facility] [place] [address] [place], [place] [address] [phone]

## 2018-12-02 ENCOUNTER — Encounter: Payer: Self-pay | Admitting: Physician Assistant

## 2018-12-02 ENCOUNTER — Ambulatory Visit (INDEPENDENT_AMBULATORY_CARE_PROVIDER_SITE_OTHER): Payer: Managed Care, Other (non HMO) | Admitting: Physician Assistant

## 2018-12-02 ENCOUNTER — Other Ambulatory Visit: Payer: Self-pay

## 2018-12-02 VITALS — BP 130/100 | HR 93 | Ht 60.25 in | Wt 164.8 lb

## 2018-12-02 DIAGNOSIS — I2510 Atherosclerotic heart disease of native coronary artery without angina pectoris: Secondary | ICD-10-CM

## 2018-12-02 DIAGNOSIS — Z720 Tobacco use: Secondary | ICD-10-CM

## 2018-12-02 DIAGNOSIS — I10 Essential (primary) hypertension: Secondary | ICD-10-CM

## 2018-12-02 DIAGNOSIS — E785 Hyperlipidemia, unspecified: Secondary | ICD-10-CM | POA: Diagnosis not present

## 2018-12-02 MED ORDER — AMLODIPINE BESYLATE 10 MG PO TABS: 10.0000 mg | ORAL_TABLET | Freq: Every day | ORAL | 11 refills | Status: AC

## 2018-12-02 NOTE — Telephone Encounter (Signed)
Returned to [employer] via [employer]

## 2018-12-02 NOTE — Patient Instructions (Signed)
Medication Instructions:  Your physician has recommended you make the following change in your medication:  1- INCREASE Norvasc Take 1 tablet (10 mg total) by mouth daily. If you need a refill on your cardiac medications before your next appointment, please call your pharmacy.   Lab work: None ordered If you have labs (blood work) drawn today and your tests are completely normal, you will receive your results only by: Marland Kitchen MyChart Message (if you have MyChart) OR . A paper copy in the mail If you have any lab test that is abnormal or we need to change your treatment, we will call you to review the results.  Testing/Procedures: 1- Renal artery u/s. DATE_______ Time________ Your physician has requested that you have a renal artery duplex. During this test, an ultrasound is used to evaluate blood flow to the kidneys. Allow one hour for this exam. Do not eat after midnight the day before and avoid carbonated beverages. Take your medications as you usually do.    Follow-Up: At [facility], you and your health needs are our priority.  As part of our continuing mission to provide you with exceptional heart care, we have created designated Provider Care Teams.  These Care Teams include your primary Cardiologist (physician) and Advanced Practice Providers (APPs -  Physician Assistants and Nurse Practitioners) who all work together to provide you with the care you need, when you need it. You will need a follow up appointment in 2 weeks. You may see Kathlyn Sacramento, MD or Christell Faith, PA-C.

## 2018-12-04 NOTE — Addendum Note (Signed)
Addended by: Raelene Bott, Deziray Nabi L on: 12/04/2018 04:02 PM   Modules accepted: Orders

## 2018-12-11 ENCOUNTER — Telehealth: Payer: Self-pay

## 2018-12-11 NOTE — Telephone Encounter (Signed)

## 2018-12-12 ENCOUNTER — Other Ambulatory Visit: Payer: Self-pay

## 2018-12-12 ENCOUNTER — Encounter

## 2018-12-12 ENCOUNTER — Ambulatory Visit (INDEPENDENT_AMBULATORY_CARE_PROVIDER_SITE_OTHER): Payer: Managed Care, Other (non HMO)

## 2018-12-12 DIAGNOSIS — I10 Essential (primary) hypertension: Secondary | ICD-10-CM

## 2018-12-13 NOTE — Progress Notes (Signed)
Cardiology Office Note Date:  12/18/2018  Patient ID:  Monica, Meza 1980-09-24, MRN [id number] PCP:  Ricardo Jericho, NP  Cardiologist:  Dr. Fletcher Anon, MD    Chief Complaint: Follow up  History of Present Illness: [name] is a 38 y.o. female with history of CAD status post RCA stenting x2 in 2013 and 2014 with recent non-STEMI in 09/2018 status post PCI as outlined below, uncontrolled hypertension diagnosed at age 26, hyperlipidemia, tobacco abuse, and obesity who presents for follow-up of her hypertension.  Patient was recently admitted to the hospital in 09/2018 with chest pain in the setting of uncontrolled hypertension with systolic blood pressure of 240 mmHg. EKG showed minor inferior ST segment elevation. She continued to have chest pain in spite of controlling her blood pressure and underwent urgent cardiac cath which showed 95% stenosis in the distal RCA which was distal to the previously placed stent. She underwent successful PCI/DES. The RCA was overall diffusely diseased in the proximal and mid segments. There was also a 90% stenosis in the LCx which was treated successfully with staged PCI. She was seen intelehealth follow-up on 10/31/2018 noting difficulty in controlling her blood pressure. Initially, she was not able to get her medications but was subsequently able to get them from [facility]. There remained concern about possible compliance issues as her blood pressure was reasonably controlled while admitted. She was able to obtain nicotine patches and was attempting smoking cessation. She wanted to be cleared to go back to work however it was explained to her that her blood pressure would need to be below 621 systolic prior to returning back to work. Patient documented blood pressure was noted to be 181/96 with a heart rate of 112 bpm at that virtual visit. Given financial constraints she was transitioned from Brilinta to Plavix at that visit with recommendation  to continue dual antiplatelet therapy for at least the next 12 months. Her carvedilol was increased to 12.5 mg twice daily, otherwise she was continued on amlodipine 5 mg daily, benazepril 20 mg daily, HCTZ 25 mg daily, dual antiplatelet therapy with aspirin and Plavix, and atorvastatin 80 mg daily.  She was scheduled for telehealth follow up of her HTN on 7/2, though did not have any BP readings for review.  In this setting, her appointment was postponed and scheduled for in person on 7/7 with BP noted to be improved to 130/100 at that time. She reported compliance with medications. Her amlodipine was increased to 10 mg daily, otherwise she was continued on benazepril 20 mg, Coreg 12.5 mg bid, and HCTZ 25 mg. Renal artery ultrasound on 12/12/2018 showed no evidence of RAS.   Patient comes in doing well from a cardiac perspective.  She denies any chest pain, shortness of breath, palpitations, dizziness, presyncope, syncope.  Blood pressure continues to run in the 308M to 578I systolic.  She reports compliance with all medications.  She notes headaches when her blood pressure is greater than 696 systolic.  No falls, BRBPR, or melena.  No lower extremity swelling, abdominal distention, orthopnea, PND, early satiety.  She does report a long history of snoring with apneic episodes with no prior sleep study.  She continues to drink liquor on the weekends.  Labs: 09/2018 -WBC 9.3, Hgb 13.3, PLT 313, potassium 3.8, serum creatinine 0.58, LDL 128, magnesium 2.1, AST/ALT normal.   Past Medical History:  Diagnosis Date   Hyperlipidemia    Hypertension    Myocardial infarction ([place])  2014    Past Surgical History:  Procedure Laterality Date   CARDIAC CATHETERIZATION N/A 10/15/2014   Procedure: Left Heart Cath;  Surgeon: Dalia HeadingKenneth A Fath, MD;  Location: [facility] INVASIVE CV LAB;  Service: Cardiovascular;  Laterality: N/A;   CARDIAC SURGERY  Cardiac stent to right coronary artery   CORONARY STENT  INTERVENTION N/A 10/07/2018   Procedure: CORONARY STENT INTERVENTION;  Surgeon: Yvonne KendallEnd, Christopher, MD;  Location: [facility] INVASIVE CV LAB;  Service: Cardiovascular;  Laterality: N/A;   CORONARY/GRAFT ACUTE MI REVASCULARIZATION N/A 10/04/2018   Procedure: Coronary/Graft Acute MI Revascularization;  Surgeon: Iran OuchArida, Muhammad A, MD;  Location: [facility] INVASIVE CV LAB;  Service: Cardiovascular;  Laterality: N/A;   LEFT HEART CATH AND CORONARY ANGIOGRAPHY N/A 10/04/2018   Procedure: LEFT HEART CATH AND CORONARY ANGIOGRAPHY;  Surgeon: Iran OuchArida, Muhammad A, MD;  Location: [facility] INVASIVE CV LAB;  Service: Cardiovascular;  Laterality: N/A;   ORIF ANKLE FRACTURE Right 03/01/2015   Procedure: OPEN REDUCTION INTERNAL FIXATION (ORIF) ANKLE FRACTURE;  Surgeon: Kennedy BuckerMichael Menz, MD;  Location: [facility] ORS;  Service: Orthopedics;  Laterality: Right;   STENT PLACEMENT VASCULAR ([facility] HX)     TUBAL LIGATION  2004    Current Meds  Medication Sig   Albuterol Sulfate (PROAIR RESPICLICK) 108 (90 Base) MCG/ACT AEPB Inhale 2 puffs into the lungs every 6 (six) hours.   amLODipine (NORVASC) 10 MG tablet Take 1 tablet (10 mg total) by mouth daily.   aspirin EC 81 MG tablet Take 1 tablet (81 mg total) by mouth daily.   atorvastatin (LIPITOR) 80 MG tablet Take 1 tablet (80 mg total) by mouth daily.   benazepril (LOTENSIN) 20 MG tablet Take 1 tablet (20 mg total) by mouth daily.   carvedilol (COREG) 12.5 MG tablet Take 1 tablet (12.5 mg total) by mouth 2 (two) times daily with a meal.   citalopram (CELEXA) 20 MG tablet Take 20 mg by mouth daily.    clopidogrel (PLAVIX) 75 MG tablet Take 1 tablet (75 mg total) by mouth daily.   hydrochlorothiazide (HYDRODIURIL) 25 MG tablet Take 1 tablet (25 mg total) by mouth daily.   nicotine (NICODERM CQ) 21 mg/24hr patch Place 1 patch (21 mg total) onto the skin daily.   nitroGLYCERIN (NITROSTAT) 0.4 MG SL tablet Place 1 tablet (0.4 mg total) under the tongue every 5 (five) minutes as needed.     Allergies:   Patient has no known allergies.   Social History:  The patient  reports that she has been smoking cigarettes. She has been smoking about 1.00 pack per day. She has never used smokeless tobacco. She reports current alcohol use. She reports that she does not use drugs.   Family History:  The patient's family history includes Diabetes in her father and mother; Heart attack in her sister; Hypertension in her father and mother.  ROS:   Review of Systems  Constitutional: Negative for chills, diaphoresis, fever, malaise/fatigue and weight loss.  HENT: Negative for congestion.   Eyes: Negative for discharge and redness.  Respiratory: Negative for cough, hemoptysis, sputum production, shortness of breath and wheezing.   Cardiovascular: Negative for chest pain, palpitations, orthopnea, claudication, leg swelling and PND.  Gastrointestinal: Negative for abdominal pain, blood in stool, heartburn, melena, nausea and vomiting.  Genitourinary: Negative for hematuria.  Musculoskeletal: Negative for falls and myalgias.  Skin: Negative for rash.  Neurological: Positive for headaches. Negative for dizziness, tingling, tremors, sensory change, speech change, focal weakness, loss of consciousness and weakness.  Endo/Heme/Allergies: Does not bruise/bleed easily.  Psychiatric/Behavioral: Negative for substance abuse. The patient is not nervous/anxious.   All other systems reviewed and are negative.    PHYSICAL EXAM:  VS:  BP (!) 160/120 (BP Location: Left Arm, Patient Position: Sitting, Cuff Size: Normal)    Pulse (!) 104    Temp 98.6 F (37 C)    Ht 5' 0.25" (1.53 m)    Wt 165 lb 4 oz (75 kg)    SpO2 98%    BMI 32.01 kg/m  BMI: Body mass index is 32.01 kg/m.  Physical Exam  Constitutional: She is oriented to person, place, and time. She appears well-developed and well-nourished.  HENT:  Head: Normocephalic and atraumatic.  Eyes: Right eye exhibits no discharge. Left eye exhibits no  discharge.  Neck: Normal range of motion. No JVD present.  Cardiovascular: Normal rate, regular rhythm, S1 normal, S2 normal and normal heart sounds. Exam reveals no distant heart sounds, no friction rub, no midsystolic click and no opening snap.  No murmur heard. Pulses:      Posterior tibial pulses are 2+ on the right side and 2+ on the left side.  Pulmonary/Chest: Effort normal and breath sounds normal. No respiratory distress. She has no decreased breath sounds. She has no wheezes. She has no rales. She exhibits no tenderness.  Abdominal: Soft. She exhibits no distension. There is no abdominal tenderness.  Musculoskeletal:        General: No edema.  Neurological: She is alert and oriented to person, place, and time.  Skin: Skin is warm and dry. No cyanosis. Nails show no clubbing.  Psychiatric: She has a normal mood and affect. Her speech is normal and behavior is normal. Judgment and thought content normal.     EKG:  Was not ordered today.  Recent Labs: 10/04/2018: ALT 11 10/06/2018: Magnesium 2.1 10/15/2018: BUN 20; Creatinine, Ser 0.58; Hemoglobin 13.3; Platelets 313; Potassium 3.8; Sodium 135  10/06/2018: Cholesterol 220; HDL 52; LDL Cholesterol 128; Total CHOL/HDL Ratio 4.2; Triglycerides 201; VLDL 40   CrCl cannot be calculated (Patient's most recent lab result is older than the maximum 21 days allowed.).   Wt Readings from Last 3 Encounters:  12/18/18 165 lb 4 oz (75 kg)  12/02/18 164 lb 12 oz (74.7 kg)  10/31/18 163 lb (73.9 kg)     Other studies reviewed: Additional studies/records reviewed today include: summarized above  ASSESSMENT AND PLAN:  1. CAD involving the native coronary arteries with recent non-STEMI without angina: She is doing well from a cardiac perspective without angina physically for angina.  Continue dual antiplatelet therapy with aspirin 81 mg daily and Plavix 85 mg daily without interruption through at least 09/2019 continue Coreg as outlined below  along with Lipitor.  Aggressive resector modification and secondary prevention.  No plans for further ischemic evaluation at this time.  2. Essential hypertension: Blood pressure remains suboptimally controlled with fluctuations from the 130s to 160s systolic.  She reports compliance with medications.  Recent renal artery ultrasound negative for renal artery stenosis.  Check random cortisol and renin aldosterone ratio.  Refer to pulmonology for sleep study given sleep disordered breathing.  Increase Coreg to 25 mg twice daily otherwise she will remain on benazepril 20 mg daily, amlodipine 10 mg daily, and HCTZ 25 mg daily.  Recommend complete smoking and alcohol cessation.  Weight loss advised.  3. Hyperlipidemia: LDL of 128 from 09/2018.  Remains on Lipitor.  Check fasting lipid panel at next visit.  Goal LDL less than 70.  4.  Tobacco abuse: She continues to intermittently wear nicotine patches.  She is currently smoking half pack per day.  Complete cessation is recommended.  5. Morbid obesity with sleep disordered breathing: Weight loss is advised.  Refer to pulmonology for sleep study as above.  Disposition: F/u with Dr. Kirke CorinArida or an APP in 1 month.  Current medicines are reviewed at length with the patient today.  The patient did not have any concerns regarding medicines.  Signed, Eula Listenyan Derrek Puff, PA-C 12/18/2018 3:39 PM     [facility] [address] [place], [place] [address] [phone]

## 2018-12-16 ENCOUNTER — Telehealth: Payer: Self-pay

## 2018-12-16 NOTE — Telephone Encounter (Signed)
-----   Message from Rise Mu, PA-C sent at 12/13/2018  7:49 AM EDT ----- Renal artery ultrasound is normal.

## 2018-12-16 NOTE — Telephone Encounter (Signed)
Attempted to contact the pt x2. Unable to lmom. Pt phone rings out and then gives a busy signal.

## 2018-12-16 NOTE — Telephone Encounter (Signed)
Attempted to call the patient.  No answer & no voice mail- we will attempt to call the patient back at a later time.

## 2018-12-17 ENCOUNTER — Telehealth: Payer: Self-pay | Admitting: Cardiovascular Disease

## 2018-12-17 NOTE — Telephone Encounter (Signed)

## 2018-12-17 NOTE — Telephone Encounter (Signed)
Results called to pt. Pt verbalized understanding.  

## 2018-12-18 ENCOUNTER — Other Ambulatory Visit: Payer: Self-pay

## 2018-12-18 ENCOUNTER — Encounter: Payer: Self-pay | Admitting: Physician Assistant

## 2018-12-18 ENCOUNTER — Ambulatory Visit (INDEPENDENT_AMBULATORY_CARE_PROVIDER_SITE_OTHER): Payer: Managed Care, Other (non HMO) | Admitting: Physician Assistant

## 2018-12-18 VITALS — BP 160/120 | HR 104 | Temp 98.6°F | Ht 60.25 in | Wt 165.2 lb

## 2018-12-18 DIAGNOSIS — I10 Essential (primary) hypertension: Secondary | ICD-10-CM | POA: Diagnosis not present

## 2018-12-18 DIAGNOSIS — G479 Sleep disorder, unspecified: Secondary | ICD-10-CM

## 2018-12-18 DIAGNOSIS — E785 Hyperlipidemia, unspecified: Secondary | ICD-10-CM | POA: Diagnosis not present

## 2018-12-18 DIAGNOSIS — I2510 Atherosclerotic heart disease of native coronary artery without angina pectoris: Secondary | ICD-10-CM

## 2018-12-18 DIAGNOSIS — G4730 Sleep apnea, unspecified: Secondary | ICD-10-CM

## 2018-12-18 DIAGNOSIS — Z720 Tobacco use: Secondary | ICD-10-CM

## 2018-12-18 MED ORDER — CARVEDILOL 25 MG PO TABS: 25.0000 mg | ORAL_TABLET | Freq: Two times a day (BID) | ORAL | 5 refills | Status: AC

## 2018-12-18 MED ORDER — CARVEDILOL 25 MG PO TABS: 25.0000 mg | ORAL_TABLET | Freq: Two times a day (BID) | ORAL | 5 refills | Status: DC

## 2018-12-18 NOTE — Patient Instructions (Signed)
Medication Instructions:  Your physician has recommended you make the following change in your medication:   INCREASE Carvedilol to 25mg  twice daily  If you need a refill on your cardiac medications before your next appointment, please call your pharmacy.   Lab work: Cortisol and Aldosterone level  Please have your labs drawn at [facility]. You do not need an appointment. Their hours are M-F 8am-4:30pm   If you have labs (blood work) drawn today and your tests are completely normal, you will receive your results only by: Marland Kitchen MyChart Message (if you have MyChart) OR . A paper copy in the mail If you have any lab test that is abnormal or we need to change your treatment, we will call you to review the results.  Testing/Procedures: None ordered  Follow-Up: At [facility], you and your health needs are our priority.  As part of our continuing mission to provide you with exceptional heart care, we have created designated Provider Care Teams.  These Care Teams include your primary Cardiologist (physician) and Advanced Practice Providers (APPs -  Physician Assistants and Nurse Practitioners) who all work together to provide you with the care you need, when you need it. You will need a follow up appointment in 4 weeks.   You may see Kathlyn Sacramento, MD or one of the following Advanced Practice Providers on your designated Care Team:   Murray Hodgkins, NP Christell Faith, PA-C . Marrianne Mood, PA-C  Any Other Special Instructions Will Be Listed Below (If Applicable). You have been referred to Pulmonology for sleep study evaluation

## 2018-12-24 NOTE — Telephone Encounter (Signed)
Received records request [place], forwarded to [facility] for processing. Also faxed and sent via interoffice

## 2018-12-24 NOTE — Telephone Encounter (Signed)
2nd request from [place]

## 2019-01-06 ENCOUNTER — Ambulatory Visit (INDEPENDENT_AMBULATORY_CARE_PROVIDER_SITE_OTHER): Payer: Managed Care, Other (non HMO) | Admitting: Internal Medicine

## 2019-01-06 DIAGNOSIS — G4719 Other hypersomnia: Secondary | ICD-10-CM

## 2019-01-06 DIAGNOSIS — F17210 Nicotine dependence, cigarettes, uncomplicated: Secondary | ICD-10-CM

## 2019-01-06 NOTE — Progress Notes (Signed)
[facility] Consultation      Assessment and Plan:  Excessive daytime sleepiness. - Symptoms and signs of obstructive sleep apnea. - We will send for sleep study, start on CPAP as indicated.  Essential hypertension, history of myocardial infarction. - Obstructive sleep apnea contribute to above conditions, therefore treatment of sleep apnea is an important part of their management.  Nicotine abuse. - Currently smoking about half a pack a day, has nicotine patches but she cannot afford them therefore she is not using them daily. - Discussed setting a quit date, and will get her the information for the [place] quit line.  Spent 3 minutes in discussion.  Orders Placed This Encounter  Procedures  . Home sleep test   Return in about 3 months (around 04/08/2019).   Date: 01/06/2019  MRN# [id number] [name] 1981/03/22  Referring Physician: Christell Faith [facility]  [place] Corporal [name] is a 38 y.o. old female seen in consultation for chief complaint of: daytime sleepiness.    HPI:  [name] is a 38 y.o. old female she has a history of CAD, and since then she has been having elevated BP which was been difficulty to manage.  She snores at night, she is sleepy during the day, and takes naps during the day of up to 2 hours.  Denies sleep paralysis, sleep walking, cataplexy. Denies jaw pain, no dentures or TMJ.   She is smoking half ppd, she is using a nicotine patch 21 mg, but not every day.    PMHX:   Past Medical History:  Diagnosis Date  . Hyperlipidemia   . Hypertension   . Myocardial infarction [facility])    2014   Surgical Hx:  Past Surgical History:  Procedure Laterality Date  . CARDIAC CATHETERIZATION N/A 10/15/2014   Procedure: Left Heart Cath;  Surgeon: Teodoro Spray, MD;  Location: [facility] CV LAB;  Service: Cardiovascular;  Laterality: N/A;  . CARDIAC SURGERY  Cardiac stent to right coronary artery  . CORONARY STENT INTERVENTION N/A 10/07/2018   Procedure: CORONARY STENT INTERVENTION;  Surgeon: Nelva Bush, MD;  Location: [facility] CV LAB;  Service: Cardiovascular;  Laterality: N/A;  . CORONARY/GRAFT ACUTE MI REVASCULARIZATION N/A 10/04/2018   Procedure: Coronary/Graft Acute MI Revascularization;  Surgeon: Wellington Hampshire, MD;  Location: [facility] CV LAB;  Service: Cardiovascular;  Laterality: N/A;  . LEFT HEART CATH AND CORONARY ANGIOGRAPHY N/A 10/04/2018   Procedure: LEFT HEART CATH AND CORONARY ANGIOGRAPHY;  Surgeon: Wellington Hampshire, MD;  Location: [facility] CV LAB;  Service: Cardiovascular;  Laterality: N/A;  . ORIF ANKLE FRACTURE Right 03/01/2015   Procedure: OPEN REDUCTION INTERNAL FIXATION (ORIF) ANKLE FRACTURE;  Surgeon: Hessie Knows, MD;  Location: [facility] ORS;  Service: Orthopedics;  Laterality: Right;  . STENT PLACEMENT VASCULAR ([facility] HX)    . TUBAL LIGATION  2004   Family Hx:  Family History  Problem Relation Age of Onset  . Diabetes Mother   . Hypertension Mother   . Diabetes Father   . Hypertension Father   . Heart attack Sister    Social Hx:   Social History   Tobacco Use  . Smoking status: Current Every Day Smoker    Packs/day: 1.00    Types: Cigarettes  . Smokeless tobacco: Never Used  Substance Use Topics  . Alcohol use: Yes    Comment: occas  . Drug use: No   Medication:    Current Outpatient Medications:  .  Albuterol Sulfate (PROAIR  RESPICLICK) 108 (90 Base) MCG/ACT AEPB, Inhale 2 puffs into the lungs every 6 (six) hours., Disp: 1 each, Rfl: 0 .  amLODipine (NORVASC) 10 MG tablet, Take 1 tablet (10 mg total) by mouth daily., Disp: 30 tablet, Rfl: 11 .  aspirin EC 81 MG tablet, Take 1 tablet (81 mg total) by mouth daily., Disp: 30 tablet, Rfl: 1 .  atorvastatin (LIPITOR) 80 MG tablet, Take 1 tablet (80 mg total) by mouth daily., Disp: 30 tablet, Rfl: 3 .  benazepril (LOTENSIN) 20 MG tablet, Take 1 tablet (20 mg total) by mouth daily., Disp: 30 tablet, Rfl: 3 .  carvedilol (COREG) 25 MG  tablet, Take 1 tablet (25 mg total) by mouth 2 (two) times daily with a meal., Disp: 60 tablet, Rfl: 5 .  citalopram (CELEXA) 20 MG tablet, Take 20 mg by mouth daily. , Disp: , Rfl:  .  clopidogrel (PLAVIX) 75 MG tablet, Take 1 tablet (75 mg total) by mouth daily., Disp: 30 tablet, Rfl: 6 .  hydrochlorothiazide (HYDRODIURIL) 25 MG tablet, Take 1 tablet (25 mg total) by mouth daily., Disp: 30 tablet, Rfl: 3 .  nicotine (NICODERM CQ) 21 mg/24hr patch, Place 1 patch (21 mg total) onto the skin daily., Disp: 28 patch, Rfl: 0 .  nitroGLYCERIN (NITROSTAT) 0.4 MG SL tablet, Place 1 tablet (0.4 mg total) under the tongue every 5 (five) minutes as needed., Disp: 30 tablet, Rfl: 1   Allergies:  Patient has no known allergies.  Review of Systems: Gen:  Denies  fever, sweats, chills HEENT: Denies blurred vision, double vision. bleeds, sore throat Cvc:  No dizziness, chest pain. Resp:   Denies cough or sputum production, shortness of breath Gi: Denies swallowing difficulty, stomach pain. Gu:  Denies bladder incontinence, burning urine Ext:   No Joint pain, stiffness. Skin: No skin rash,  hives  Endoc:  No polyuria, polydipsia. Psych: No depression, insomnia. Other:  All other systems were reviewed with the patient and were negative other that what is mentioned in the HPI.      LABORATORY PANEL:   CBC No results for input(s): WBC, HGB, HCT, PLT in the last 168 hours. ------------------------------------------------------------------------------------------------------------------  Chemistries  No results for input(s): NA, K, CL, CO2, GLUCOSE, BUN, CREATININE, CALCIUM, MG, AST, ALT, ALKPHOS, BILITOT in the last 168 hours.  Invalid input(s): GFRCGP ------------------------------------------------------------------------------------------------------------------  Cardiac Enzymes No results for input(s): TROPONINI in the last 168 hours.  ------------------------------------------------------------  RADIOLOGY:  No results found.     Thank  you for the consultation and for allowing [name] to assist in the Meza of your patient. Our recommendations are noted above.  Please contact us if we can be of further service.   Wells Guileseep Shawnta Zimbelman, M.D., F.C.C.P.  Board Certified in Internal Medicine, Pulmonary Medicine, Critical Meza Medicine, and Sleep Medicine.  [facility] Office Number: [phone]   01/06/2019

## 2019-01-06 NOTE — Patient Instructions (Addendum)
Will send for sleep study.   Will give you the number for the [facility]. [phone]   Sleep Apnea    Sleep apnea is disorder that affects a person's sleep. A person with sleep apnea has abnormal pauses in their breathing when they sleep. It is hard for them to get a good sleep. This makes a person tired during the day. It also can lead to other physical problems. There are three types of sleep apnea. One type is when breathing stops for a short time because your airway is blocked (obstructive sleep apnea). Another type is when the brain sometimes fails to give the normal signal to breathe to the muscles that control your breathing (central sleep apnea). The third type is a combination of the other two types.  HOME CARE   Take all medicine as told by your doctor.  Avoid alcohol, calming medicines (sedatives), and depressant drugs.  Try to lose weight if you are overweight. Talk to your doctor about a healthy weight goal.  Your doctor may have you use a device that helps to open your airway. It can help you get the air that you need. It is called a positive airway pressure (PAP) device.   MAKE SURE YOU:   Understand these instructions.  Will watch your condition.  Will get help right away if you are not doing well or get worse.  It may take approximately 1 month for you to get used to wearing her CPAP every night.  Be sure to work with your machine to get used to it, be patient, it may take time!  If you have trouble tolerating CPAP DO NOT RETURN YOUR MACHINE; Contact our office to see if we can help you tolerate the CPAP better first!

## 2019-01-12 NOTE — Progress Notes (Deleted)
Cardiology Office Note    Date:  01/12/2019   ID:  [name], DOB 07/16/1980, MRN [id number]  PCP:  Titus MouldWhite, Elizabeth Burney, NP  Cardiologist:  Lorine BearsMuhammad Arida, MD  Electrophysiologist:  None   Chief Complaint: Follow up  History of Present Illness:   Monica Andaina H Bourn is a 38 y.o. female with history of CAD status post RCA stenting x2 in 2013 and 2014 with recent non-STEMI in 09/2018 status post PCI as outlined below, uncontrolled hypertension diagnosed at age 38, hyperlipidemia, tobacco abuse,and obesity who presents for follow-up of her hypertension.  Patient was recently admitted to the hospital in 09/2018 with chest pain in the setting of uncontrolled hypertension with systolic blood pressure of 240 mmHg. EKG showed minor inferior ST segment elevation. She continued to have chest pain in spite of controlling her blood pressure and underwent urgent cardiac cath which showed 95% stenosis in the distal RCA which was distal to the previously placed stent. She underwent successful PCI/DES. The RCA was overall diffusely diseased in the proximal and mid segments. There was also a 90% stenosis in the LCx which was treated successfully with staged PCI. She was seen intelehealth follow-up on 10/31/2018 noting difficulty in controlling her blood pressure. Initially, she was not able to get her medications but was subsequently able to get them from [facility]. There remained concern about possible compliance issues as her blood pressure was reasonably controlled while admitted. She was able to obtain nicotine patches and was attempting smoking cessation. She wanted to be cleared to go back to work however it was explained to her that her blood pressure would need to be below 160 systolic prior to returning back to work. Patient documented blood pressure was noted to be 181/96 with a heart rate of 112 bpm at that virtual visit. Given financial constraints she was transitioned from Brilinta to Plavix  at that visit with recommendation to continue dual antiplatelet therapy for at least the next 12 months. Her carvedilol was increased to 12.5 mg twice daily, otherwise she was continued on amlodipine 5 mg daily, benazepril 20 mg daily, HCTZ 25 mg daily, dual antiplatelet therapy with aspirin and Plavix, and atorvastatin 80 mg daily.  She was scheduledfor telehealth follow up of her HTN on 7/2, though did not have any BP readings for review. In this setting, her appointment was postponed and scheduled for in person on 7/7 with BP noted to be improved to 130/100 at that time. She reported compliance with medications. Her amlodipine was increased to 10 mg daily, otherwise she was continued on benazepril 20 mg, Coreg 12.5 mg bid, and HCTZ 25 mg. Renal artery ultrasound on 12/12/2018 showed no evidence of RAS.   She was most recently seen in the office on 12/18/2018 with blood pressure of 160/120.  She continued to report compliance with medications.  She continued to drink liquor on the weekends.  She did report a long history of snoring with apneic episodes and was referred to pulmonology for consideration of sleep study.  She was seen by pulmonology on 01/06/2019 with recommendation to move forward with sleep study.  ***  Labs: 09/2018 -WBC 9.3, Hgb 13.3, PLT 313, potassium 3.8, serum creatinine 0.58, LDL 128, magnesium 2.1, AST/ALT normal  Past Medical History:  Diagnosis Date   Hyperlipidemia    Hypertension    Myocardial infarction [facility](HCC)    2014    Past Surgical History:  Procedure Laterality Date   CARDIAC CATHETERIZATION N/A 10/15/2014   Procedure:  Left Heart Cath;  Surgeon: Dalia HeadingKenneth A Fath, MD;  Location: [facility] INVASIVE CV LAB;  Service: Cardiovascular;  Laterality: N/A;   CARDIAC SURGERY  Cardiac stent to right coronary artery   CORONARY STENT INTERVENTION N/A 10/07/2018   Procedure: CORONARY STENT INTERVENTION;  Surgeon: Yvonne KendallEnd, Christopher, MD;  Location: [facility] INVASIVE CV LAB;  Service:  Cardiovascular;  Laterality: N/A;   CORONARY/GRAFT ACUTE MI REVASCULARIZATION N/A 10/04/2018   Procedure: Coronary/Graft Acute MI Revascularization;  Surgeon: Iran OuchArida, Muhammad A, MD;  Location: [facility] INVASIVE CV LAB;  Service: Cardiovascular;  Laterality: N/A;   LEFT HEART CATH AND CORONARY ANGIOGRAPHY N/A 10/04/2018   Procedure: LEFT HEART CATH AND CORONARY ANGIOGRAPHY;  Surgeon: Iran OuchArida, Muhammad A, MD;  Location: [facility] INVASIVE CV LAB;  Service: Cardiovascular;  Laterality: N/A;   ORIF ANKLE FRACTURE Right 03/01/2015   Procedure: OPEN REDUCTION INTERNAL FIXATION (ORIF) ANKLE FRACTURE;  Surgeon: Kennedy BuckerMichael Menz, MD;  Location: [facility] ORS;  Service: Orthopedics;  Laterality: Right;   STENT PLACEMENT VASCULAR ([facility] HX)     TUBAL LIGATION  2004    Current Medications: No outpatient medications have been marked as taking for the 01/15/19 encounter (Appointment) with Sondra Bargesunn, Imajean Mcdermid M, PA-C.     Allergies:   Patient has no known allergies.   Social History   Socioeconomic History   Marital status: Married    Spouse name: Not on file   Number of children: Not on file   Years of education: Not on file   Highest education level: Not on file  Occupational History   Not on file  Social Needs   Financial resource strain: Not on file   Food insecurity    Worry: Not on file    Inability: Not on file   Transportation needs    Medical: Not on file    Non-medical: Not on file  Tobacco Use   Smoking status: Current Every Day Smoker    Packs/day: 1.00    Types: Cigarettes   Smokeless tobacco: Never Used  Substance and Sexual Activity   Alcohol use: Yes    Comment: occas   Drug use: No   Sexual activity: Not on file  Lifestyle   Physical activity    Days per week: Not on file    Minutes per session: Not on file   Stress: Not on file  Relationships   Social connections    Talks on phone: Not on file    Gets together: Not on file    Attends religious service: Not on file    Active  member of club or organization: Not on file    Attends meetings of clubs or organizations: Not on file    Relationship status: Not on file  Other Topics Concern   Not on file  Social History Narrative   Not on file     Family History:  The patient's family history includes Diabetes in her father and mother; Heart attack in her sister; Hypertension in her father and mother.  ROS:   ROS   EKGs/Labs/Other Studies Reviewed:    Studies reviewed were summarized above. The additional studies were reviewed today:  LHC 10/04/2018:  The left ventricular systolic function is normal.  LV end diastolic pressure is mildly elevated.  The left ventricular ejection fraction is 55-65% by visual estimate.  Mid RCA lesion is 30% stenosed.  Prox RCA to Mid RCA lesion is 70% stenosed.  Ost RCA to Prox RCA lesion is 40% stenosed.  Dist RCA lesion is 95% stenosed.  Post intervention, there is a 0% residual stenosis.  A drug-eluting stent was successfully placed using a STENT RESOLUTE ONYX 2.5X15.  Mid Cx lesion is 90% stenosed.  Ost LAD lesion is 20% stenosed.   1.  Severe two-vessel coronary artery disease.  The culprit seems to be severe stenosis in the distal right coronary artery.  The stents in the mid to distal RCA are patent with mild to moderate in-stent restenosis and borderline significant stenosis proximal to the stent.  There is also severe stenosis in the mid left circumflex. 2.  Normal LV systolic function.   3.Severely elevated blood pressure with an LVEDP of 22 mmHg. 4.  Successful angioplasty and drug-eluting stent placement to the distal right coronary artery.  Recommendations: The patient presented with chest pain and borderline inferior ST elevation on EKG.  However, she was extremely hypertensive by EMS with systolic blood pressure of 240.  Previous EKG also showed old inferior infarct.  Due to that, I elected to delay Cath Lab activation in order to control blood  pressure and evaluate her response and EKG changes.  The patient continued to have 5 out of 10 chest pain and EKG continued to show mild inferior ST elevation and thus urgent cardiac catheterization was performed.  Continue dual antiplatelet therapy indefinitely given multiple drug-eluting stents. The patient needs aggressive control of blood pressure and risk factors. Recommend staged left circumflex PCI on Monday or Tuesday. __________  Staged PCI 10/07/2018: Conclusions: 1. Mild, non-obstructive disease involving the LAD. 2. 90% mid LCx stenosis extending into OM2. 3. Successful PCI to mid LCx/OM2 using Resolute Onyx 2.25 x 18 mm DES with 0% residual stenosis and TIMI-3 flow.  Recommendations: 1. Continue at least 12 months of DAPT with ASA and ticagrelor. 2. Aggressive secondary prevention. 3. Likely discharge home tomorrow. __________  2D Echo 09/2018: 1. The left ventricle has normal systolic function, with an ejection fraction of 55-60%. The cavity size was normal. There is severe concentric left ventricular hypertrophy. Left ventricular diastolic Doppler parameters are indeterminate.  2. The right ventricle has normal systolic function. The cavity was normal. There is no increase in right ventricular wall thickness. Right ventricular systolic pressure could not be assessed.  3. The tricuspid valve is grossly normal.  4. The aortic valve is grossly normal. Aortic valve regurgitation was not assessed by color flow Doppler.   EKG:  EKG is ordered today.  The EKG ordered today demonstrates ***  Recent Labs: 10/04/2018: ALT 11 10/06/2018: Magnesium 2.1 10/15/2018: BUN 20; Creatinine, Ser 0.58; Hemoglobin 13.3; Platelets 313; Potassium 3.8; Sodium 135  Recent Lipid Panel    Component Value Date/Time   CHOL 220 (H) 10/06/2018 0437   CHOL 214 (H) 01/08/2014 0737   TRIG 201 (H) 10/06/2018 0437   TRIG 280 (H) 01/08/2014 0737   HDL 52 10/06/2018 0437   HDL 42 01/08/2014 0737    CHOLHDL 4.2 10/06/2018 0437   VLDL 40 10/06/2018 0437   VLDL 56 (H) 01/08/2014 0737   LDLCALC 128 (H) 10/06/2018 0437   LDLCALC 116 (H) 01/08/2014 0737    PHYSICAL EXAM:    VS:  There were no vitals taken for this visit.  BMI: There is no height or weight on file to calculate BMI.  Physical Exam  Wt Readings from Last 3 Encounters:  12/18/18 165 lb 4 oz (75 kg)  12/02/18 164 lb 12 oz (74.7 kg)  10/31/18 163 lb (73.9 kg)     ASSESSMENT & PLAN:   1. ***  Disposition: F/u with Dr. Kirke CorinArida or an APP in ***.   Medication Adjustments/Labs and Tests Ordered: Current medicines are reviewed at length with the patient today.  Concerns regarding medicines are outlined above. Medication changes, Labs and Tests ordered today are summarized above and listed in the Patient Instructions accessible in Encounters.   Signed, Eula Listenyan Shali Vesey, PA-C 01/12/2019 11:35 AM     [facility] [place] [address] [place], [place] [address] [phone]

## 2019-01-15 ENCOUNTER — Ambulatory Visit: Payer: Managed Care, Other (non HMO) | Admitting: Physician Assistant

## 2019-01-22 ENCOUNTER — Emergency Department
Admission: EM | Admit: 2019-01-22 | Discharge: 2019-01-22 | Disposition: A | Discharge status: Home / Self Care | Payer: Managed Care, Other (non HMO) | Attending: Emergency Medicine | Admitting: Emergency Medicine

## 2019-01-22 ENCOUNTER — Other Ambulatory Visit: Payer: Self-pay

## 2019-01-22 DIAGNOSIS — N61 Mastitis without abscess: Secondary | ICD-10-CM | POA: Insufficient documentation

## 2019-01-22 DIAGNOSIS — I10 Essential (primary) hypertension: Secondary | ICD-10-CM | POA: Diagnosis not present

## 2019-01-22 DIAGNOSIS — Z955 Presence of coronary angioplasty implant and graft: Secondary | ICD-10-CM | POA: Insufficient documentation

## 2019-01-22 DIAGNOSIS — Z7982 Long term (current) use of aspirin: Secondary | ICD-10-CM | POA: Diagnosis not present

## 2019-01-22 DIAGNOSIS — N644 Mastodynia: Secondary | ICD-10-CM | POA: Diagnosis present

## 2019-01-22 DIAGNOSIS — F17210 Nicotine dependence, cigarettes, uncomplicated: Secondary | ICD-10-CM | POA: Diagnosis not present

## 2019-01-22 DIAGNOSIS — Z79899 Other long term (current) drug therapy: Secondary | ICD-10-CM | POA: Insufficient documentation

## 2019-01-22 LAB — BASIC METABOLIC PANEL
Anion gap: 14 (ref 5–15)
BUN: 12 mg/dL (ref 6–20)
CO2: 21 mmol/L — ABNORMAL LOW (ref 22–32)
Calcium: 9.4 mg/dL (ref 8.9–10.3)
Chloride: 105 mmol/L (ref 98–111)
Creatinine, Ser: 0.94 mg/dL (ref 0.44–1.00)
GFR calc Af Amer: >60 mL/min (ref >60)
GFR calc non Af Amer: >60 mL/min (ref >60)
Glucose, Bld: 120 mg/dL — ABNORMAL HIGH (ref 70–99)
Potassium: 3.4 mmol/L — ABNORMAL LOW (ref 3.5–5.1)
Sodium: 140 mmol/L (ref 135–145)

## 2019-01-22 LAB — CBC WITH DIFFERENTIAL/PLATELET
Abs Immature Granulocytes: 0.04 10*3/uL (ref 0.00–0.07)
Basophils Absolute: 0.1 10*3/uL (ref 0.0–0.1)
Basophils Relative: 1 %
Eosinophils Absolute: 0.1 10*3/uL (ref 0.0–0.5)
Eosinophils Relative: 1 %
HCT: 42.7 % (ref 36.0–46.0)
Hemoglobin: 14.4 g/dL (ref 12.0–15.0)
Immature Granulocytes: 1 %
Lymphocytes Relative: 30 %
Lymphs Abs: 2.6 10*3/uL (ref 0.7–4.0)
MCH: 31.5 pg (ref 26.0–34.0)
MCHC: 33.7 g/dL (ref 30.0–36.0)
MCV: 93.4 fL (ref 80.0–100.0)
Monocytes Absolute: 0.6 10*3/uL (ref 0.1–1.0)
Monocytes Relative: 7 %
Neutro Abs: 5.3 10*3/uL (ref 1.7–7.7)
Neutrophils Relative %: 60 %
Platelets: 293 10*3/uL (ref 150–400)
RBC: 4.57 MIL/uL (ref 3.87–5.11)
RDW: 13.3 % (ref 11.5–15.5)
WBC: 8.6 10*3/uL (ref 4.0–10.5)
nRBC: 0 % (ref 0.0–0.2)

## 2019-01-22 MED ORDER — OXYCODONE-ACETAMINOPHEN 5-325 MG PO TABS: 1.0000 | ORAL_TABLET | Freq: Four times a day (QID) | ORAL | 0 refills | Status: AC | PRN

## 2019-01-22 MED ORDER — CLINDAMYCIN PHOSPHATE 600 MG/50ML IV SOLN
600.0000 mg | Freq: Once | INTRAVENOUS | Status: AC
  Administered 2019-01-22: 600 mg via INTRAVENOUS
  Filled 2019-01-22: qty 50

## 2019-01-22 MED ORDER — OXYCODONE-ACETAMINOPHEN 5-325 MG PO TABS
1.0000 | ORAL_TABLET | Freq: Once | ORAL | Status: AC
  Administered 2019-01-22: 1 via ORAL
  Filled 2019-01-22: qty 1

## 2019-01-22 MED ORDER — CLINDAMYCIN HCL 150 MG PO CAPS: ORAL_CAPSULE | ORAL | 0 refills | Status: DC

## 2019-01-22 NOTE — Discharge Instructions (Addendum)
Follow-up with your primary care provider.  Call tomorrow to make an appointment to be seen the first of the week.  Begin using warm compresses to your right breast frequently.  Continue taking the antibiotic as directed.  Pain medication was sent to your pharmacy and should be taken only as directed.  Do not drive or operate machinery while taking this pain medication.  Return to the emergency department if any severe worsening of your symptoms such as fever, chills, vomiting or inability to take the antibiotic.

## 2019-01-22 NOTE — ED Triage Notes (Signed)
C/o egg sized abscess to right breast X 3 days, hx of same. Pt alert and oriented X4, cooperative, RR even and unlabored, color WNL. Pt in NAD. Redness to right breast, painful, warm to touch.

## 2019-01-22 NOTE — ED Triage Notes (Signed)
First RN Note: Pt presents to ED via POV with c/o egg sized abscess to her breast. Pt A&O x4, ambulatory without difficulty.

## 2019-01-22 NOTE — ED Notes (Signed)
Messaged with Dr. Jimmye Norman regarding pt sx and presentation, denies further orders at this time. Appropriate for flex care.

## 2019-01-22 NOTE — ED Provider Notes (Signed)
[facility] Emergency Department Provider Note  ____________________________________________   First MD Initiated Contact with Patient 01/22/19 1714     (approximate)  I have reviewed the triage vital signs and the nursing notes.   HISTORY  Chief Complaint Abscess   HPI [name] is a 38 y.o. female presents to the ED with complaint of right breast pain.  Patient states that she has had abscesses in her left breast in the past that had to be surgically drained.  Patient states that she noticed an area on her breast 3 days ago which has become red and painful.  She denies any fever or chills.  She has not been using over-the-counter medication without any relief of her pain.  She rates her pain as an 8 out of 10.     Past Medical History:  Diagnosis Date  . Hyperlipidemia   . Hypertension   . Myocardial infarction [facility])    2014    Patient Active Problem List   Diagnosis Date Noted  . Non-ST elevation (NSTEMI) myocardial infarction ([place]) 10/05/2018  . Hypertension   . Hypokalemia   . Trimalleolar fracture of ankle, closed 02/28/2015  . NSTEMI (non-ST elevated myocardial infarction) ([place]) 10/14/2014    Past Surgical History:  Procedure Laterality Date  . CARDIAC CATHETERIZATION N/A 10/15/2014   Procedure: Left Heart Cath;  Surgeon: Teodoro Spray, MD;  Location: [place] CV LAB;  Service: Cardiovascular;  Laterality: N/A;  . CARDIAC SURGERY  Cardiac stent to right coronary artery  . CORONARY STENT INTERVENTION N/A 10/07/2018   Procedure: CORONARY STENT INTERVENTION;  Surgeon: Nelva Bush, MD;  Location: [place] CV LAB;  Service: Cardiovascular;  Laterality: N/A;  . CORONARY/GRAFT ACUTE MI REVASCULARIZATION N/A 10/04/2018   Procedure: Coronary/Graft Acute MI Revascularization;  Surgeon: Wellington Hampshire, MD;  Location: [place] CV LAB;  Service: Cardiovascular;  Laterality: N/A;  . LEFT HEART CATH AND CORONARY ANGIOGRAPHY N/A  10/04/2018   Procedure: LEFT HEART CATH AND CORONARY ANGIOGRAPHY;  Surgeon: Wellington Hampshire, MD;  Location: [place] CV LAB;  Service: Cardiovascular;  Laterality: N/A;  . ORIF ANKLE FRACTURE Right 03/01/2015   Procedure: OPEN REDUCTION INTERNAL FIXATION (ORIF) ANKLE FRACTURE;  Surgeon: Hessie Knows, MD;  Location: [facility] ORS;  Service: Orthopedics;  Laterality: Right;  . STENT PLACEMENT VASCULAR ([place] HX)    . TUBAL LIGATION  2004    Prior to Admission medications   Medication Sig Start Date End Date Taking? Authorizing Provider  Albuterol Sulfate (PROAIR RESPICLICK) 630 (90 Base) MCG/ACT AEPB Inhale 2 puffs into the lungs every 6 (six) hours. 06/03/18   Fisher, Linden Dolin, PA-C  amLODipine (NORVASC) 10 MG tablet Take 1 tablet (10 mg total) by mouth daily. 12/02/18   Rise Mu, PA-C  aspirin EC 81 MG tablet Take 1 tablet (81 mg total) by mouth daily. 10/16/14   Gladstone Lighter, MD  atorvastatin (LIPITOR) 80 MG tablet Take 1 tablet (80 mg total) by mouth daily. 06/03/18   Fisher, Linden Dolin, PA-C  benazepril (LOTENSIN) 20 MG tablet Take 1 tablet (20 mg total) by mouth daily. 06/03/18   Caryn Section Linden Dolin, PA-C  carvedilol (COREG) 25 MG tablet Take 1 tablet (25 mg total) by mouth 2 (two) times daily with a meal. 12/18/18   Dunn, Areta Haber, PA-C  citalopram (CELEXA) 20 MG tablet Take 20 mg by mouth daily.  10/15/18 10/15/19  [provider]  clindamycin (CLEOCIN) 150 MG capsule Take 2 tabs tid  01/22/19   Tommi RumpsSummers, Brace Welte L, PA-C  clopidogrel (PLAVIX) 75 MG tablet Take 1 tablet (75 mg total) by mouth daily. 10/31/18   Iran OuchArida, Muhammad A, MD  hydrochlorothiazide (HYDRODIURIL) 25 MG tablet Take 1 tablet (25 mg total) by mouth daily. 06/03/18   Fisher, Roselyn BeringSusan W, PA-C  nicotine (NICODERM CQ) 21 mg/24hr patch Place 1 patch (21 mg total) onto the skin daily. 10/08/18   Adrian SaranMody, Sital, MD  nitroGLYCERIN (NITROSTAT) 0.4 MG SL tablet Place 1 tablet (0.4 mg total) under the tongue every 5 (five) minutes as needed. 10/08/18  10/08/19  Adrian SaranMody, Sital, MD  oxyCODONE-acetaminophen (PERCOCET) 5-325 MG tablet Take 1 tablet by mouth every 6 (six) hours as needed for severe pain. 01/22/19   Tommi RumpsSummers, Osaze Hubbert L, PA-C    Allergies Patient has no known allergies.  Family History  Problem Relation Age of Onset  . Diabetes Mother   . Hypertension Mother   . Diabetes Father   . Hypertension Father   . Heart attack Sister     Social History Social History   Tobacco Use  . Smoking status: Current Every Day Smoker    Packs/day: 1.00    Types: Cigarettes  . Smokeless tobacco: Never Used  Substance Use Topics  . Alcohol use: Yes    Comment: occas  . Drug use: No    Review of Systems Constitutional: No fever/chills Cardiovascular: Denies chest pain. Respiratory: Denies shortness of breath. Gastrointestinal:  No nausea, no vomiting.   Musculoskeletal: Negative for back pain. Skin: Positive for right breast pain questionable abscess. Neurological: Negative for headaches, focal weakness or numbness. ____________________________________________   PHYSICAL EXAM:  VITAL SIGNS: ED Triage Vitals [01/22/19 1659]  Enc Vitals Group     BP (!) 146/110     Pulse Rate (!) 102     Resp 18     Temp 99.2 F (37.3 C)     Temp Source Oral     SpO2 95 %     Weight 167 lb (75.8 kg)     Height 5\' 6"  (1.676 m)     Head Circumference      Peak Flow      Pain Score 8     Pain Loc      Pain Edu?      Excl. in GC?     Constitutional: Alert and oriented. Well appearing and in no acute distress. Eyes: Conjunctivae are normal.  Head: Atraumatic. Neck: No stridor.   Cardiovascular: Normal rate, regular rhythm. Grossly normal heart sounds.  Good peripheral circulation. Respiratory: Normal respiratory effort.  No retractions. Lungs CTAB. Gastrointestinal: Soft and nontender. No distention. Musculoskeletal: Moves upper and lower extremities with any difficulty normal gait was noted. Neurologic:  Normal speech and language. No  gross focal neurologic deficits are appreciated. No gait instability. Skin:  Skin is warm, dry and intact.  On examination of the right breast tissue and size of the right breast is extremely large due to patient's habitus.  There is a warm tender area measuring approximately 2 cm above the nipple laterally.  There is no nipple discharge or dimpling noted.  There is no localized abscess at this time however skin is warm to touch.  This is suspicious for an early abscess. Psychiatric: Mood and affect are normal. Speech and behavior are normal.  ____________________________________________   LABS (all labs ordered are listed, but only abnormal results are displayed)  Labs Reviewed  BASIC METABOLIC PANEL - Abnormal; Notable for the following components:  Result Value   Potassium 3.4 (*)    CO2 21 (*)    Glucose, Bld 120 (*)    All other components within normal limits  CBC WITH DIFFERENTIAL/PLATELET   ___________________________________________   PROCEDURES  Procedure(s) performed (including Critical Care):  Procedures   ____________________________________________   INITIAL IMPRESSION / ASSESSMENT AND PLAN / ED COURSE  As part of my medical decision making, I reviewed the following data within the electronic MEDICAL RECORD NUMBER Notes from prior ED visits and [place] Controlled Substance Database  38 year old female presents to the ED with complaint of right breast pain that started approximately 4 days ago.  She states that area is warm and tender to touch.  She denies any nipple discharge, fever, chills, nausea or vomiting.  Patient has a history of an abscess in her left breast.  Patient labs were reassuring.  Patient was given oxycodone while in the ED and IV clindamycin.  She was discharged with a prescription for the same medication and instructions to start using warm moist compresses frequently.  She is return to the emergency department if not improving or any worsening of her  symptoms otherwise she is to follow-up with her PCP.  ____________________________________________   FINAL CLINICAL IMPRESSION(S) / ED DIAGNOSES  Final diagnoses:  Mastitis, right, acute     ED Discharge Orders         Ordered    oxyCODONE-acetaminophen (PERCOCET) 5-325 MG tablet  Every 6 hours PRN     01/22/19 1845    clindamycin (CLEOCIN) 150 MG capsule     01/22/19 1845           Note:  This document was prepared using Dragon voice recognition software and may include unintentional dictation errors.    Tommi Rumps, PA-C 01/22/19 Gay Filler, MD 01/22/19 2022

## 2019-01-22 NOTE — ED Notes (Signed)
Reports painful not on her right breast. Reports knot has been there x 4 days, denies drainage,.

## 2019-01-28 ENCOUNTER — Ambulatory Visit: Payer: Managed Care, Other (non HMO)

## 2019-01-28 ENCOUNTER — Other Ambulatory Visit: Payer: Self-pay

## 2019-01-28 DIAGNOSIS — G4733 Obstructive sleep apnea (adult) (pediatric): Secondary | ICD-10-CM | POA: Diagnosis not present

## 2019-01-28 DIAGNOSIS — G4719 Other hypersomnia: Secondary | ICD-10-CM

## 2019-02-03 ENCOUNTER — Telehealth: Payer: Self-pay | Admitting: Internal Medicine

## 2019-02-03 ENCOUNTER — Other Ambulatory Visit: Payer: Self-pay

## 2019-02-03 ENCOUNTER — Encounter: Payer: Self-pay | Admitting: Cardiovascular Disease

## 2019-02-03 ENCOUNTER — Ambulatory Visit (INDEPENDENT_AMBULATORY_CARE_PROVIDER_SITE_OTHER): Payer: Managed Care, Other (non HMO) | Admitting: Cardiovascular Disease

## 2019-02-03 VITALS — BP 176/108 | HR 103 | Ht 60.0 in | Wt 167.8 lb

## 2019-02-03 DIAGNOSIS — G4733 Obstructive sleep apnea (adult) (pediatric): Secondary | ICD-10-CM

## 2019-02-03 DIAGNOSIS — Z720 Tobacco use: Secondary | ICD-10-CM

## 2019-02-03 DIAGNOSIS — I2510 Atherosclerotic heart disease of native coronary artery without angina pectoris: Secondary | ICD-10-CM

## 2019-02-03 DIAGNOSIS — I10 Essential (primary) hypertension: Secondary | ICD-10-CM | POA: Diagnosis not present

## 2019-02-03 DIAGNOSIS — E785 Hyperlipidemia, unspecified: Secondary | ICD-10-CM

## 2019-02-03 MED ORDER — SPIRONOLACTONE 25 MG PO TABS: 25.0000 mg | ORAL_TABLET | Freq: Every day | ORAL | 3 refills | Status: AC

## 2019-02-03 NOTE — Telephone Encounter (Signed)
Left message x2 for pt. 

## 2019-02-03 NOTE — Progress Notes (Signed)
Cardiology Office Note   Date:  02/03/2019   ID:  [name], [name] 1980/08/28, MRN [id number]  PCP:  Titus Mould, NP  Cardiologist:   Lorine Bears, MD   Chief Complaint  Patient presents with  . other    4 wk f/u c/o elevated BP and right side breast mastitis. Meds reviewed verbally with pt.      History of Present Illness: [name] is a 38 y.o. female who presents for a follow-up visit regarding coronary artery disease and refractory hypertension.  She has known history of coronary artery disease status post RCA stenting x2 in 2013 and 2014, uncontrolled hypertension, hyperlipidemia and obesity. She presented  to [facility] in May with non-ST elevation myocardial infarction in the setting of severely elevated blood pressure.  Urgent cardiac catheterization  showed 95% stenosis in the distal RCA distal to the previously placed stents.  I performed successful angioplasty and drug-eluting stent placement.  The RCA was overall diffusely diseased in the proximal and mid segment.  There was also a 90% stenosis in the left circumflex.  This was treated with staged PCI.  She is known to have refractory hypertension in spite of multiple medications with occasional poor noncompliance.  Her blood pressure medications have been slowly uptitrated and blood pressure continues to be suboptimal.  She underwent renal artery duplex which showed no evidence of renal artery stenosis.  She has been doing reasonably well without chest pain or shortness of breath.  She went to the emergency room recently due to mastitis and was prescribed clindamycin and pain medication.  She continues to have breast pain and she attributes her elevated blood pressure to that.  She was seen by pulmonary and underwent sleep study which confirmed moderate sleep apnea.  CPAP was recommended.   Past Medical History:  Diagnosis Date  . Hyperlipidemia   . Hypertension   . Myocardial infarction [facility])    2014    Past Surgical History:  Procedure Laterality Date  . CARDIAC CATHETERIZATION N/A 10/15/2014   Procedure: Left Heart Cath;  Surgeon: Dalia Heading, MD;  Location: [facility] INVASIVE CV LAB;  Service: Cardiovascular;  Laterality: N/A;  . CARDIAC SURGERY  Cardiac stent to right coronary artery  . CORONARY STENT INTERVENTION N/A 10/07/2018   Procedure: CORONARY STENT INTERVENTION;  Surgeon: Yvonne Kendall, MD;  Location: [facility] INVASIVE CV LAB;  Service: Cardiovascular;  Laterality: N/A;  . CORONARY/GRAFT ACUTE MI REVASCULARIZATION N/A 10/04/2018   Procedure: Coronary/Graft Acute MI Revascularization;  Surgeon: Iran Ouch, MD;  Location: [facility] INVASIVE CV LAB;  Service: Cardiovascular;  Laterality: N/A;  . LEFT HEART CATH AND CORONARY ANGIOGRAPHY N/A 10/04/2018   Procedure: LEFT HEART CATH AND CORONARY ANGIOGRAPHY;  Surgeon: Iran Ouch, MD;  Location: [facility] INVASIVE CV LAB;  Service: Cardiovascular;  Laterality: N/A;  . ORIF ANKLE FRACTURE Right 03/01/2015   Procedure: OPEN REDUCTION INTERNAL FIXATION (ORIF) ANKLE FRACTURE;  Surgeon: Kennedy Bucker, MD;  Location: [facility] ORS;  Service: Orthopedics;  Laterality: Right;  . STENT PLACEMENT VASCULAR ([facility] HX)    . TUBAL LIGATION  2004     Current Outpatient Medications  Medication Sig Dispense Refill  . Albuterol Sulfate (PROAIR RESPICLICK) 108 (90 Base) MCG/ACT AEPB Inhale 2 puffs into the lungs every 6 (six) hours. 1 each 0  . amLODipine (NORVASC) 10 MG tablet Take 1 tablet (10 mg total) by mouth daily. 30 tablet 11  . aspirin EC 81 MG tablet Take 1 tablet (81 mg  total) by mouth daily. 30 tablet 1  . atorvastatin (LIPITOR) 80 MG tablet Take 1 tablet (80 mg total) by mouth daily. 30 tablet 3  . benazepril (LOTENSIN) 20 MG tablet Take 1 tablet (20 mg total) by mouth daily. 30 tablet 3  . carvedilol (COREG) 25 MG tablet Take 1 tablet (25 mg total) by mouth 2 (two) times daily with a meal. 60 tablet 5  . citalopram (CELEXA) 20 MG tablet Take 20 mg by  mouth daily.     . clindamycin (CLEOCIN) 150 MG capsule Take 2 tabs tid 42 capsule 0  . clopidogrel (PLAVIX) 75 MG tablet Take 1 tablet (75 mg total) by mouth daily. 30 tablet 6  . hydrochlorothiazide (HYDRODIURIL) 25 MG tablet Take 1 tablet (25 mg total) by mouth daily. 30 tablet 3  . nicotine (NICODERM CQ) 21 mg/24hr patch Place 1 patch (21 mg total) onto the skin daily. 28 patch 0  . nitroGLYCERIN (NITROSTAT) 0.4 MG SL tablet Place 1 tablet (0.4 mg total) under the tongue every 5 (five) minutes as needed. 30 tablet 1  . oxyCODONE-acetaminophen (PERCOCET) 5-325 MG tablet Take 1 tablet by mouth every 6 (six) hours as needed for severe pain. 20 tablet 0   No current facility-administered medications for this visit.     Allergies:   Patient has no known allergies.    Social History:  The patient  reports that she has been smoking cigarettes. She has been smoking about 1.00 pack per day. She has never used smokeless tobacco. She reports current alcohol use. She reports that she does not use drugs.   Family History:  The patient's family history includes Diabetes in her father and mother; Heart attack in her sister; Hypertension in her father and mother.    ROS:  Please see the history of present illness.   Otherwise, review of systems are positive for none.   All other systems are reviewed and negative.    PHYSICAL EXAM: VS:  BP (!) 176/108 (BP Location: Left Arm, Patient Position: Sitting, Cuff Size: Normal)   Pulse (!) 103   Ht 5' (1.524 m)   Wt 167 lb 12 oz (76.1 kg)   BMI 32.76 kg/m  , BMI Body mass index is 32.76 kg/m. GEN: Well nourished, well developed, in no acute distress  HEENT: normal  Neck: no JVD, carotid bruits, or masses Cardiac: RRR; no murmurs, rubs, or gallops,no edema  Respiratory:  clear to auscultation bilaterally, normal work of breathing GI: soft, nontender, nondistended, + BS MS: no deformity or atrophy  Skin: warm and dry, no rash Neuro:  Strength and  sensation are intact Psych: euthymic mood, full affect   EKG:  EKG is ordered today. The ekg ordered today demonstrates sinus tachycardia with moderate LVH, old inferior infarct with lateral T wave changes suggestive of ischemia.   Recent Labs: 10/04/2018: ALT 11 10/06/2018: Magnesium 2.1 01/22/2019: BUN 12; Creatinine, Ser 0.94; Hemoglobin 14.4; Platelets 293; Potassium 3.4; Sodium 140    Lipid Panel    Component Value Date/Time   CHOL 220 (H) 10/06/2018 0437   CHOL 214 (H) 01/08/2014 0737   TRIG 201 (H) 10/06/2018 0437   TRIG 280 (H) 01/08/2014 0737   HDL 52 10/06/2018 0437   HDL 42 01/08/2014 0737   CHOLHDL 4.2 10/06/2018 0437   VLDL 40 10/06/2018 0437   VLDL 56 (H) 01/08/2014 0737   LDLCALC 128 (H) 10/06/2018 0437   LDLCALC 116 (H) 01/08/2014 0737      Wt  Readings from Last 3 Encounters:  02/03/19 167 lb 12 oz (76.1 kg)  01/22/19 167 lb (75.8 kg)  12/18/18 165 lb 4 oz (75 kg)       No flowsheet data found.    ASSESSMENT AND PLAN:  1. Coronary artery disease involving native coronary arteries without angina: She is overall doing reasonably well.  Continue dual antiplatelet therapy and treatment of risk factors.   Continue dual antiplatelet therapy for at least 1 year and preferably long-term given multiple stents.    2. Essential hypertension: Renal artery duplex was read negative for renal artery stenosis.  Very difficult to control blood pressure in spite of multiple medications.  Sleep apnea might be contributing.  I elected to add spironolactone 25 mg once daily.  Check basic metabolic profile in 1 week.   She might be a candidate for renal denervation in the future once available.  3. Hyperlipidemia: Continue high-dose atorvastatin.  Target LDL of less than 70.  4. Tobacco use: She is trying to quit with nicotine patch.  5.  Sleep apnea: Recommend CPAP as recommended by pulmonary.   Disposition:   FU with me in 2 months  Signed,  Lorine BearsMuhammad Angelia Hazell, MD   02/03/2019 4:29 PM    [facility]

## 2019-02-03 NOTE — Telephone Encounter (Addendum)
HST performed on 01/29/2019 confirmed moderate OSA with AHI of 22. Recommend auto cpap 5-20cm h2O Left message to relay results.

## 2019-02-03 NOTE — Patient Instructions (Addendum)
Medication Instructions:  Your physician has recommended you make the following change in your medication:   START Spironolactone 25mg  daily. An Rx has been sent to your pharmacy  Continue your other medications as prescribed.   If you need a refill on your cardiac medications before your next appointment, please call your pharmacy.   Lab work: Your physician recommends that you return for lab work in: 1 week (bmet)  Please have your lab drawn at [facility]. You do not b=need an appointment. Their hours are Mon-Fri 7:30am-5pm   If you have labs (blood work) drawn today and your tests are completely normal, you will receive your results only by: Marland Kitchen MyChart Message (if you have MyChart) OR . A paper copy in the mail If you have any lab test that is abnormal or we need to change your treatment, we will call you to review the results.  Testing/Procedures:   Follow-Up: At [facility], you and your health needs are our priority.  As part of our continuing mission to provide you with exceptional heart care, we have created designated Provider Care Teams.  These Care Teams include your primary Cardiologist (physician) and Advanced Practice Providers (APPs -  Physician Assistants and Nurse Practitioners) who all work together to provide you with the care you need, when you need it. You will need a follow up appointment in 3 months.   You may see Kathlyn Sacramento, MD or one of the following Advanced Practice Providers on your designated Care Team:   Murray Hodgkins, NP Christell Faith, PA-C . Marrianne Mood, PA-C  Any Other Special Instructions Will Be Listed Below (If Applicable). N/A

## 2019-02-04 NOTE — Telephone Encounter (Signed)
Pt is aware of results and voiced her understanding.  Pt wished to proceed with cpap. Order has been placed.  pt stated that she would like to call back once setup on cpap to schedule an appt.  Nothing further is needed at this time.

## 2019-02-06 ENCOUNTER — Telehealth: Payer: Self-pay | Admitting: Cardiovascular Disease

## 2019-02-06 NOTE — Telephone Encounter (Signed)
Recieved request from : [facility]  Scanned   Forwarded to [employer] for processing

## 2019-03-03 ENCOUNTER — Other Ambulatory Visit: Payer: Self-pay | Admitting: Family Medicine

## 2019-03-03 DIAGNOSIS — N6320 Unspecified lump in the left breast, unspecified quadrant: Secondary | ICD-10-CM

## 2019-03-19 ENCOUNTER — Inpatient Hospital Stay
Admission: RE | Admit: 2019-03-19 | Discharge: 2019-03-19 | Disposition: A | Discharge status: Home / Self Care | Payer: Self-pay | Source: Ambulatory Visit | Attending: Family Medicine | Admitting: Family Medicine

## 2019-03-19 ENCOUNTER — Other Ambulatory Visit: Payer: Self-pay | Admitting: Family Medicine

## 2019-03-19 DIAGNOSIS — Z1231 Encounter for screening mammogram for malignant neoplasm of breast: Secondary | ICD-10-CM

## 2019-03-27 ENCOUNTER — Ambulatory Visit
Admission: RE | Admit: 2019-03-27 | Discharge: 2019-03-27 | Disposition: A | Discharge status: Home / Self Care | Payer: Managed Care, Other (non HMO) | Source: Ambulatory Visit | Attending: Family Medicine | Admitting: Family Medicine

## 2019-03-27 DIAGNOSIS — N6320 Unspecified lump in the left breast, unspecified quadrant: Secondary | ICD-10-CM

## 2019-03-31 ENCOUNTER — Ambulatory Visit
Admission: RE | Admit: 2019-03-31 | Discharge: 2019-03-31 | Disposition: A | Discharge status: Home / Self Care | Payer: Managed Care, Other (non HMO) | Source: Ambulatory Visit | Attending: Surgery | Admitting: Surgery

## 2019-03-31 ENCOUNTER — Other Ambulatory Visit: Payer: Self-pay | Admitting: Surgery

## 2019-03-31 DIAGNOSIS — N611 Abscess of the breast and nipple: Secondary | ICD-10-CM | POA: Diagnosis present

## 2019-04-03 ENCOUNTER — Ambulatory Visit: Payer: Managed Care, Other (non HMO) | Admitting: Physician Assistant

## 2019-04-05 LAB — AEROBIC/ANAEROBIC CULTURE W GRAM STAIN (SURGICAL/DEEP WOUND)

## 2019-04-10 NOTE — Progress Notes (Signed)
Cardiology Office Note    Date:  04/14/2019   ID:  Monica Andaina H Dworkin, DOB 06/09/1980, MRN [id number]  PCP:  Titus MouldWhite, Elizabeth Burney, NP  Cardiologist:  Lorine BearsMuhammad Arida, MD  Electrophysiologist:  None   Chief Complaint: Follow-up  History of Present Illness:   [name] is a 38 y.o. female with history of CAD status post RCA stenting x2 in 2013 and 2014 with non-STEMI in 09/2018 status post PCI as outlined below, refractory hypertension diagnosed at age 38, hyperlipidemia, tobacco abuse, recent diagnosis of moderate sleep apnea, and obesity who presents for follow-up of her CAD and hypertension.  She was admitted to the hospital in 09/2018 with a non-STEMI in the setting of hypertensive emergency with systolic blood pressure 240 mmHg.  Urgent cardiac cath showed 95% stenosis in the distal RCA which was distal to the previously placed stent. She underwent successful PCI/DES. The RCA was overall diffusely diseased in the proximal and mid segments. There was also a 90% stenosis in the LCx which was treated successfully with staged PCI.  Her blood pressure has been difficult to control secondary to refractory hypertension despite multiple medications and complicated by occasional poor compliance.  Over the past several months, her blood pressure medications have been slowly titrated up with continued suboptimal BP readings.  She underwent renal artery duplex which showed no evidence of renal artery stenosis.  Renin aldosterone ratio and cortisol were recommended though never completed.  She was most recently seen on 02/03/2019 and was doing well without chest pain or shortness of breath.  She was attributing her more recent elevated BP readings to pain in the setting of mastitis which has now subsequently progressed to an abscess.  BP at her most recent visit was 176/108.  She has been seen by pulmonology and underwent sleep study which confirmed moderate sleep apnea with recommendation for CPAP.  It  was felt her sleep apnea may be contributing to her difficult to control hypertension.  Spironolactone 25 mg daily was added to her regimen which included amlodipine 10 mg daily, benazepril 20 mg daily, carvedilol 25 mg twice daily, HCTZ 25 mg daily.  She comes in doing very well today.  She has subsequently started using her CPAP and has gotten used to this.  She also notes improved pain along her breast tissue following her recent infection.  Given her recent MI in 09/2018 status post PCI and on DAPT as outlined above plans are for the patient to potentially have the cyst drained by interventional radiology for temporary symptom relief with concomitant antibiotic usage.  If this becomes a recurrent issue she may require formal surgical excision.  Nonetheless, patient notes significant improvement in her discomfort.  She is trying to eat a low-sodium diet.  She has been compliant with all medications.  She has not been checking her blood pressure at home.  She denies any chest pain, shortness of breath, palpitations, dizziness, presyncope, syncope, lower extremity swelling, abdominal distention, orthopnea, PND, early satiety.  No falls, BRBPR, or melena.  She also notes improvement in her energy.   Labs: 02/2019 -total cholesterol 266, triglyceride 477, HDL 69, direct LDL 129 12/2018 -potassium 3.3, BUN 12, serum creatinine 0.94, hemoglobin 14.4, PLT 293  Past Medical History:  Diagnosis Date   Hyperlipidemia    Hypertension    Myocardial infarction (HCC)    2014    Past Surgical History:  Procedure Laterality Date   BREAST CYST ASPIRATION Bilateral    CARDIAC CATHETERIZATION N/A  10/15/2014   Procedure: Left Heart Cath;  Surgeon: Teodoro Spray, MD;  Location: [place] CV LAB;  Service: Cardiovascular;  Laterality: N/A;   CARDIAC SURGERY  Cardiac stent to right coronary artery   CORONARY STENT INTERVENTION N/A 10/07/2018   Procedure: CORONARY STENT INTERVENTION;  Surgeon: Nelva Bush, MD;  Location: [place] CV LAB;  Service: Cardiovascular;  Laterality: N/A;   CORONARY/GRAFT ACUTE MI REVASCULARIZATION N/A 10/04/2018   Procedure: Coronary/Graft Acute MI Revascularization;  Surgeon: Wellington Hampshire, MD;  Location: [place] CV LAB;  Service: Cardiovascular;  Laterality: N/A;   LEFT HEART CATH AND CORONARY ANGIOGRAPHY N/A 10/04/2018   Procedure: LEFT HEART CATH AND CORONARY ANGIOGRAPHY;  Surgeon: Wellington Hampshire, MD;  Location: [place] CV LAB;  Service: Cardiovascular;  Laterality: N/A;   ORIF ANKLE FRACTURE Right 03/01/2015   Procedure: OPEN REDUCTION INTERNAL FIXATION (ORIF) ANKLE FRACTURE;  Surgeon: Hessie Knows, MD;  Location: [facility] ORS;  Service: Orthopedics;  Laterality: Right;   STENT PLACEMENT VASCULAR ([facility]X)     TUBAL LIGATION  2004    Current Medications: Current Meds  Medication Sig   Albuterol Sulfate (PROAIR RESPICLICK) 338 (90 Base) MCG/ACT AEPB Inhale 2 puffs into the lungs every 6 (six) hours.   amLODipine (NORVASC) 10 MG tablet Take 1 tablet (10 mg total) by mouth daily.   amoxicillin-clavulanate (AUGMENTIN) 875-125 MG tablet Take 1 tablet by mouth 2 (two) times daily.   aspirin EC 81 MG tablet Take 1 tablet (81 mg total) by mouth daily.   atorvastatin (LIPITOR) 80 MG tablet Take 1 tablet (80 mg total) by mouth daily.   benazepril (LOTENSIN) 20 MG tablet Take 1 tablet (20 mg total) by mouth daily.   carvedilol (COREG) 25 MG tablet Take 1 tablet (25 mg total) by mouth 2 (two) times daily with a meal.   citalopram (CELEXA) 20 MG tablet Take 20 mg by mouth daily.    clopidogrel (PLAVIX) 75 MG tablet Take 1 tablet (75 mg total) by mouth daily.   hydrochlorothiazide (HYDRODIURIL) 25 MG tablet Take 1 tablet (25 mg total) by mouth daily.   nicotine (NICODERM CQ) 21 mg/24hr patch Place 1 patch (21 mg total) onto the skin daily.   nitroGLYCERIN (NITROSTAT) 0.4 MG SL tablet Place 1 tablet (0.4 mg total) under the tongue  every 5 (five) minutes as needed.   oxyCODONE-acetaminophen (PERCOCET) 5-325 MG tablet Take 1 tablet by mouth every 6 (six) hours as needed for severe pain.   spironolactone (ALDACTONE) 25 MG tablet Take 1 tablet (25 mg total) by mouth daily.    Allergies:   Patient has no known allergies.   Social History   Socioeconomic History   Marital status: Married    Spouse name: Not on file   Number of children: Not on file   Years of education: Not on file   Highest education level: Not on file  Occupational History   Not on file  Social Needs   Financial resource strain: Not on file   Food insecurity    Worry: Not on file    Inability: Not on file   Transportation needs    Medical: Not on file    Non-medical: Not on file  Tobacco Use   Smoking status: Current Every Day Smoker    Packs/day: 1.00    Types: Cigarettes   Smokeless tobacco: Never Used  Substance and Sexual Activity   Alcohol use: Yes    Comment: occas   Drug use: No  Sexual activity: Not on file  Lifestyle   Physical activity    Days per week: Not on file    Minutes per session: Not on file   Stress: Not on file  Relationships   Social connections    Talks on phone: Not on file    Gets together: Not on file    Attends religious service: Not on file    Active member of club or organization: Not on file    Attends meetings of clubs or organizations: Not on file    Relationship status: Not on file  Other Topics Concern   Not on file  Social History Narrative   Not on file     Family History:  The patient's family history includes Breast cancer in her [place]; Breast cancer (age of onset: 59) in her maternal aunt; Breast cancer (age of onset: 60) in her maternal grandmother; Diabetes in her father and mother; Heart attack in her sister; Hypertension in her father and mother.  ROS:   Review of Systems  Constitutional: Negative for chills, diaphoresis, fever, malaise/fatigue and weight  loss.  HENT: Negative for congestion.   Eyes: Negative for discharge and redness.  Respiratory: Negative for cough, hemoptysis, sputum production, shortness of breath and wheezing.   Cardiovascular: Negative for chest pain, palpitations, orthopnea, claudication, leg swelling and PND.  Gastrointestinal: Negative for abdominal pain, blood in stool, heartburn, melena, nausea and vomiting.  Genitourinary: Negative for hematuria.  Musculoskeletal: Negative for falls and myalgias.  Skin: Negative for rash.  Neurological: Negative for dizziness, tingling, tremors, sensory change, speech change, focal weakness, loss of consciousness and weakness.  Endo/Heme/Allergies: Does not bruise/bleed easily.  Psychiatric/Behavioral: Negative for substance abuse. The patient is not nervous/anxious.   All other systems reviewed and are negative.    EKGs/Labs/Other Studies Reviewed:    Studies reviewed were summarized above. The additional studies were reviewed today: As above.   EKG:  EKG is ordered today.  The EKG ordered today demonstrates NSR, 94 bpm, PR interval 110 ms, LVH with early repolarization abnormality, old inferior infarct, nonspecific lateral ST-T changes  Recent Labs: 10/04/2018: ALT 11 10/06/2018: Magnesium 2.1 01/22/2019: BUN 12; Creatinine, Ser 0.94; Hemoglobin 14.4; Platelets 293; Potassium 3.4; Sodium 140  Recent Lipid Panel    Component Value Date/Time   CHOL 220 (H) 10/06/2018 0437   CHOL 214 (H) 01/08/2014 0737   TRIG 201 (H) 10/06/2018 0437   TRIG 280 (H) 01/08/2014 0737   HDL 52 10/06/2018 0437   HDL 42 01/08/2014 0737   CHOLHDL 4.2 10/06/2018 0437   VLDL 40 10/06/2018 0437   VLDL 56 (H) 01/08/2014 0737   LDLCALC 128 (H) 10/06/2018 0437   LDLCALC 116 (H) 01/08/2014 0737    PHYSICAL EXAM:    VS:  BP 120/90 (BP Location: Left Arm, Patient Position: Sitting, Cuff Size: Normal)    Pulse 94    Temp (!) 96.8 F (36 C)    Ht 5' 2.25" (1.581 m)    Wt 167 lb 4 oz (75.9 kg)     SpO2 98%    BMI 30.35 kg/m   BMI: Body mass index is 30.35 kg/m.  Physical Exam  Constitutional: She is oriented to person, place, and time. She appears well-developed and well-nourished.  HENT:  Head: Normocephalic and atraumatic.  Eyes: Right eye exhibits no discharge. Left eye exhibits no discharge.  Neck: Normal range of motion. No JVD present.  Cardiovascular: Normal rate, regular rhythm, S1 normal, S2 normal and normal heart  sounds. Exam reveals no distant heart sounds, no friction rub, no midsystolic click and no opening snap.  No murmur heard. Pulses:      Posterior tibial pulses are 2+ on the right side and 2+ on the left side.  Pulmonary/Chest: Effort normal and breath sounds normal. No respiratory distress. She has no decreased breath sounds. She has no wheezes. She has no rales. She exhibits no tenderness.  Abdominal: Soft. She exhibits no distension. There is no abdominal tenderness.  Musculoskeletal:        General: No edema.  Neurological: She is alert and oriented to person, place, and time.  Skin: Skin is warm and dry. No cyanosis. Nails show no clubbing.  Psychiatric: She has a normal mood and affect. Her speech is normal and behavior is normal. Judgment and thought content normal.    Wt Readings from Last 3 Encounters:  04/14/19 167 lb 4 oz (75.9 kg)  02/03/19 167 lb 12 oz (76.1 kg)  01/22/19 167 lb (75.8 kg)     ASSESSMENT & PLAN:   1. CAD involving the native coronary arteries without angina: She is doing well from a cardiac perspective without any symptoms concerning for angina.  Continue dual antiplatelet therapy with aspirin 81 mg daily and Plavix 75 mg daily without interruption through at least 09/2019.  Continue carvedilol and Lipitor as outlined below.  Aggressive risk factor modification and secondary prevention including complete tobacco cessation.  No plans for further ischemic work-up at this time.  2. HTN: Blood pressure is significantly improved today  at 120/90.  This is likely in the setting of initiation of CPAP for recently diagnosed sleep apnea as well as continued escalation of her antihypertensive therapy.  Continue current regimen including amlodipine, benazepril, carvedilol, HCTZ, and spironolactone.  Check BMP today.  Patient was given precautions for potential orthostasis.  Recommend weight loss, complete smoking and alcohol cessation.  3. HLD: LDL of 129 in 02/2019.  Remains on Lipitor 80 mg daily.  Goal LDL less than 70.  Recommend lifestyle modification.  If LDL remains above goal in follow-up recommend addition of Zetia.  4. Tobacco abuse: She is trying to quit with nicotine patch.  5. Recently diagnosed sleep apnea: Compliant with CPAP.  Disposition: F/u with Dr. Kirke Corin or an APP in 3 months.   Medication Adjustments/Labs and Tests Ordered: Current medicines are reviewed at length with the patient today.  Concerns regarding medicines are outlined above. Medication changes, Labs and Tests ordered today are summarized above and listed in the Patient Instructions accessible in Encounters.   Signed, Eula Listen, PA-C 04/14/2019 11:37 AM     [facility] [address] [place], [place] [address] [phone]

## 2019-04-14 ENCOUNTER — Other Ambulatory Visit: Payer: Self-pay

## 2019-04-14 ENCOUNTER — Encounter: Payer: Self-pay | Admitting: Physician Assistant

## 2019-04-14 ENCOUNTER — Ambulatory Visit (INDEPENDENT_AMBULATORY_CARE_PROVIDER_SITE_OTHER): Payer: Managed Care, Other (non HMO) | Admitting: Physician Assistant

## 2019-04-14 VITALS — BP 120/90 | HR 94 | Temp 96.8°F | Ht 62.25 in | Wt 167.2 lb

## 2019-04-14 DIAGNOSIS — Z720 Tobacco use: Secondary | ICD-10-CM | POA: Diagnosis not present

## 2019-04-14 DIAGNOSIS — G4733 Obstructive sleep apnea (adult) (pediatric): Secondary | ICD-10-CM

## 2019-04-14 DIAGNOSIS — I10 Essential (primary) hypertension: Secondary | ICD-10-CM | POA: Diagnosis not present

## 2019-04-14 DIAGNOSIS — Z9989 Dependence on other enabling machines and devices: Secondary | ICD-10-CM

## 2019-04-14 DIAGNOSIS — I2510 Atherosclerotic heart disease of native coronary artery without angina pectoris: Secondary | ICD-10-CM

## 2019-04-14 DIAGNOSIS — E785 Hyperlipidemia, unspecified: Secondary | ICD-10-CM

## 2019-04-14 NOTE — Patient Instructions (Signed)
Medication Instructions:  Your physician recommends that you continue on your current medications as directed. Please refer to the Current Medication list given to you today.  *If you need a refill on your cardiac medications before your next appointment, please call your pharmacy*  Lab Work: Your physician recommends that you have lab work today(BMET)  If you have labs (blood work) drawn today and your tests are completely normal, you will receive your results only by: Marland Kitchen MyChart Message (if you have MyChart) OR . A paper copy in the mail If you have any lab test that is abnormal or we need to change your treatment, we will call you to review the results.  Testing/Procedures: None ordered   Follow-Up: At [facility], you and your health needs are our priority.  As part of our continuing mission to provide you with exceptional heart care, we have created designated Provider Care Teams.  These Care Teams include your primary Cardiologist (physician) and Advanced Practice Providers (APPs -  Physician Assistants and Nurse Practitioners) who all work together to provide you with the care you need, when you need it.  Your next appointment:   3 months  The format for your next appointment:   In Person  Provider:    You may see Kathlyn Sacramento, MD or Christell Faith, PA-C.

## 2019-04-15 LAB — BASIC METABOLIC PANEL
BUN/Creatinine Ratio: 19 (ref 9–23)
BUN: 15 mg/dL (ref 6–20)
CO2: 21 mmol/L (ref 20–29)
Calcium: 9.5 mg/dL (ref 8.7–10.2)
Chloride: 102 mmol/L (ref 96–106)
Creatinine, Ser: 0.81 mg/dL (ref 0.57–1.00)
GFR calc Af Amer: 106 mL/min/{1.73_m2} (ref >59)
GFR calc non Af Amer: 92 mL/min/{1.73_m2} (ref >59)
Glucose: 100 mg/dL — ABNORMAL HIGH (ref 65–99)
Potassium: 4.4 mmol/L (ref 3.5–5.2)
Sodium: 139 mmol/L (ref 134–144)

## 2019-05-04 ENCOUNTER — Telehealth: Payer: Self-pay | Admitting: Cardiovascular Disease

## 2019-05-04 ENCOUNTER — Encounter: Payer: Self-pay | Admitting: Cardiovascular Disease

## 2019-05-04 NOTE — Telephone Encounter (Signed)
Letter to return to work has been dictated and sent to the patient via [place].

## 2019-05-04 NOTE — Telephone Encounter (Signed)
Patient was last seen by Christell Faith, PA on 04/14/19. Message fwd to [place] to advise.

## 2019-05-04 NOTE — Telephone Encounter (Signed)
Patient wants return to work note sent to [employer].

## 2019-07-17 ENCOUNTER — Ambulatory Visit: Payer: Managed Care, Other (non HMO) | Admitting: Cardiovascular Disease

## 2019-07-20 ENCOUNTER — Encounter: Payer: Self-pay | Admitting: Cardiovascular Disease

## 2021-02-09 IMAGING — MG DIGITAL DIAGNOSTIC BILAT W/ TOMO
8 of 14 series · 8 of 40 positions shown · non-contrast
Comparison: None.

CLINICAL DATA: Three weeks of LEFT nipple tenderness.This is
patient's first mammogram.

Remote history of a similar fluid collection at the RIGHT nipple.
EXAM:
DIGITAL DIAGNOSTIC BILATERAL MAMMOGRAM WITH CAD AND TOMO
ULTRASOUND LEFT BREAST

[L MLO synth-2D (1 of 2)]
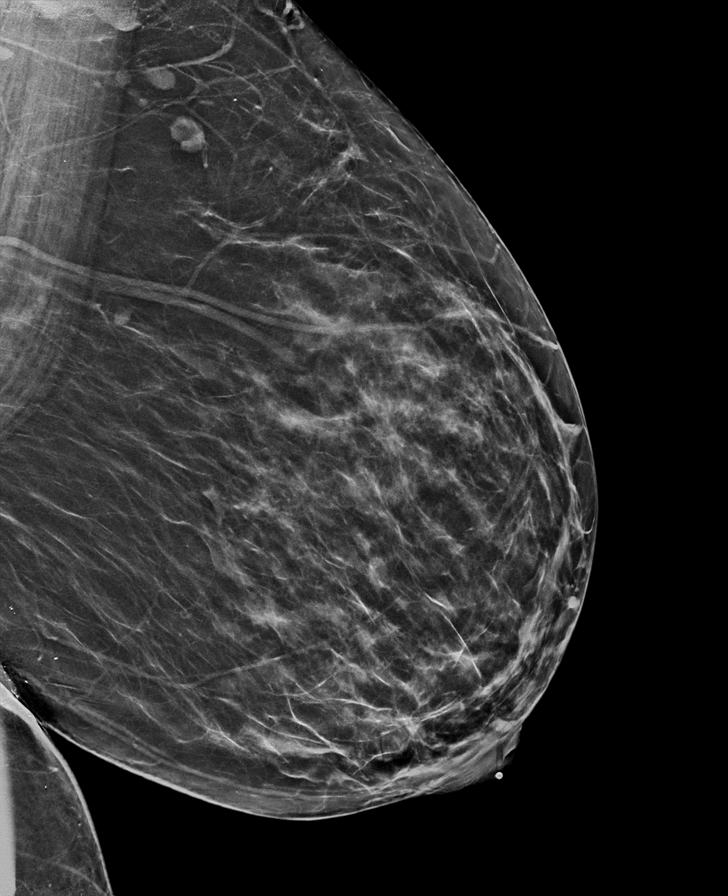

[L CC synth-2D (1 of 2)]
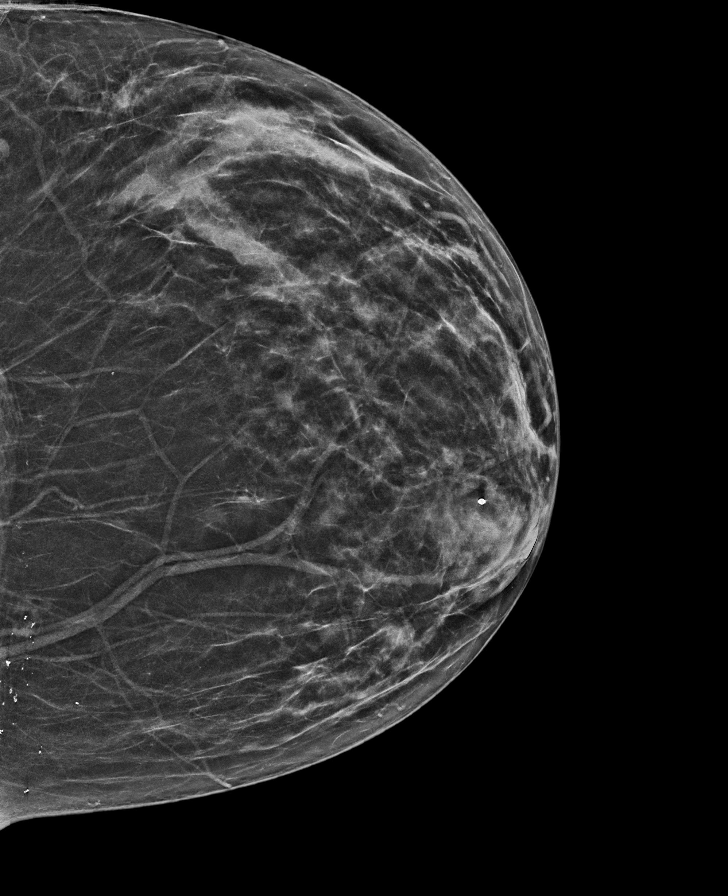

[L MLO synth-2D (2 of 2)]
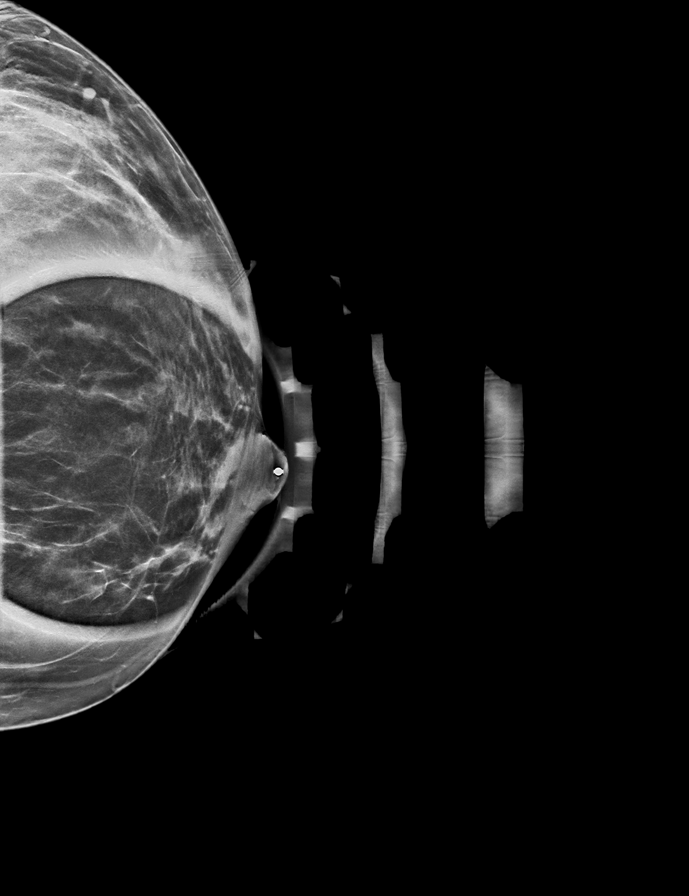

[R CC synth-2D (1 of 2)]
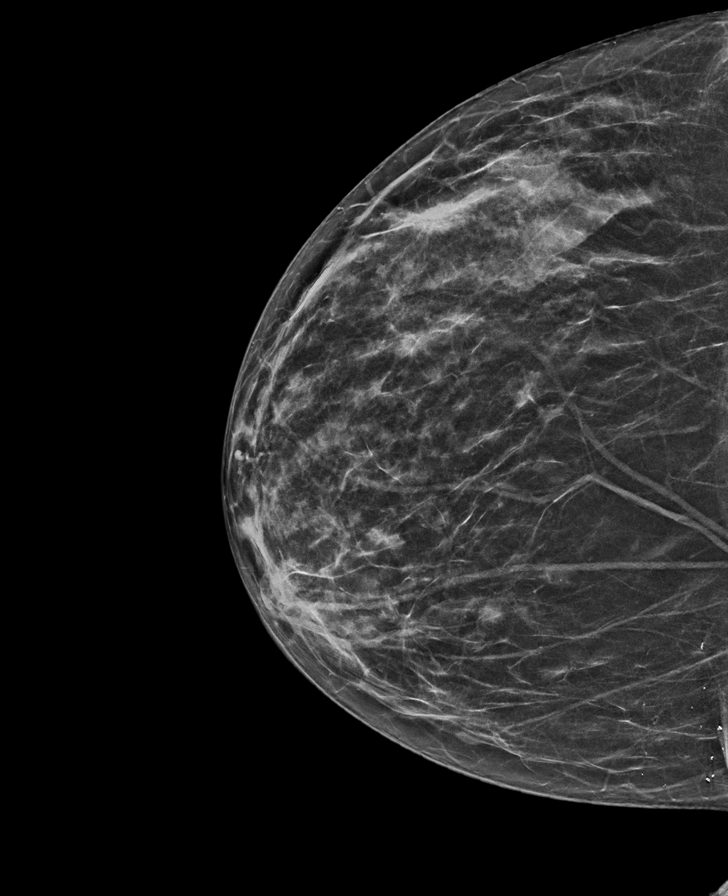

[L CC synth-2D (2 of 2)]
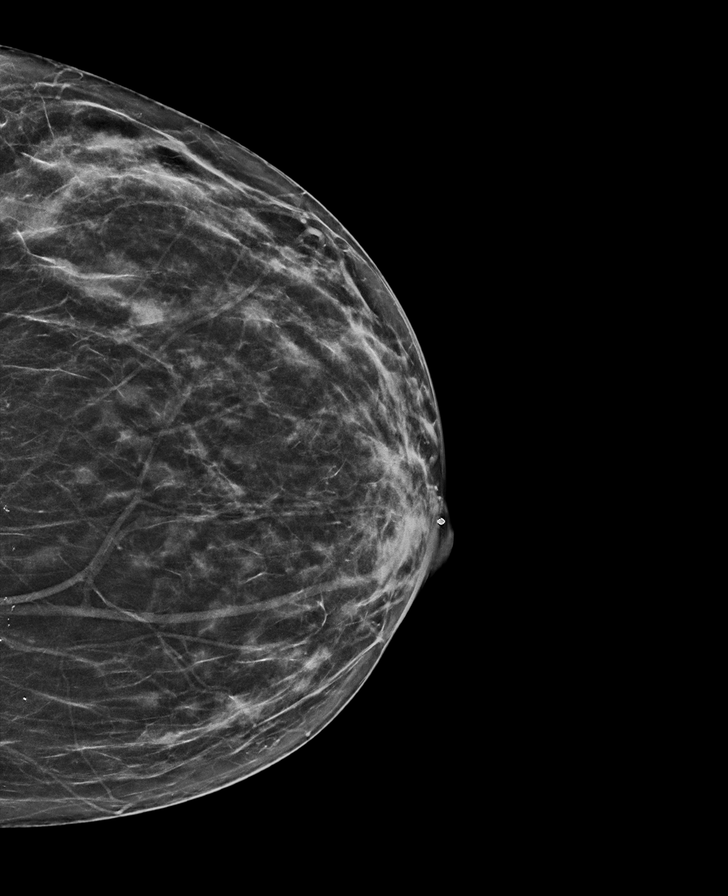

[R MLO synth-2D]
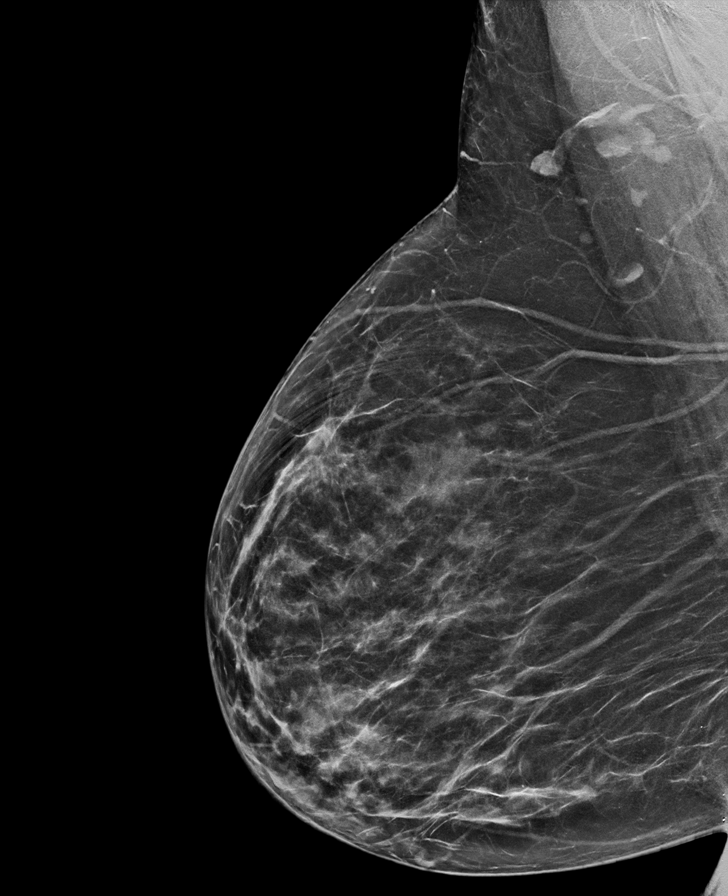

[R CC synth-2D (2 of 2)]
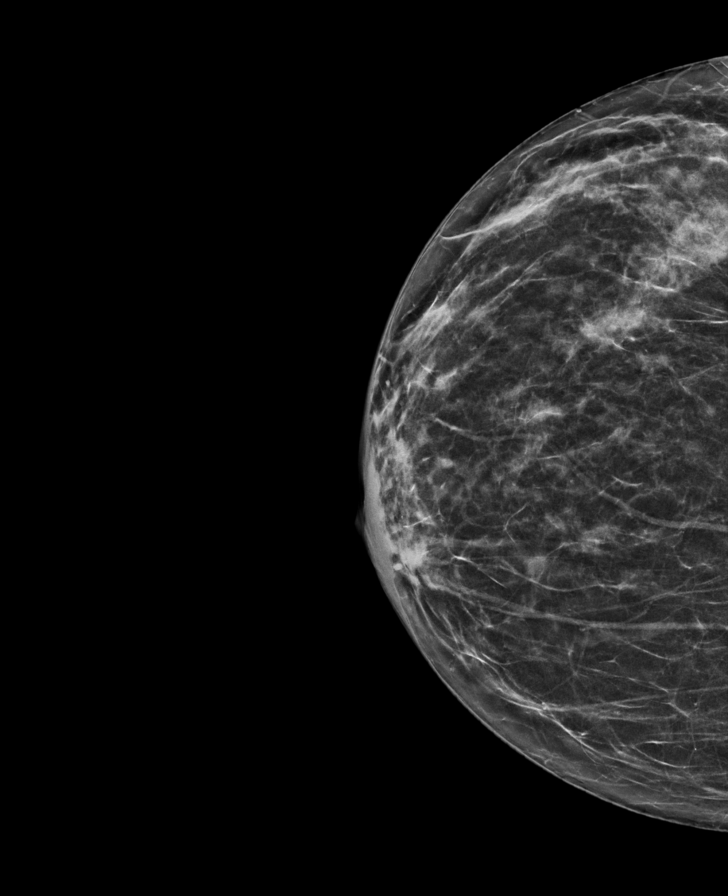

[R CC tomo · tomo slice 28/55.0]
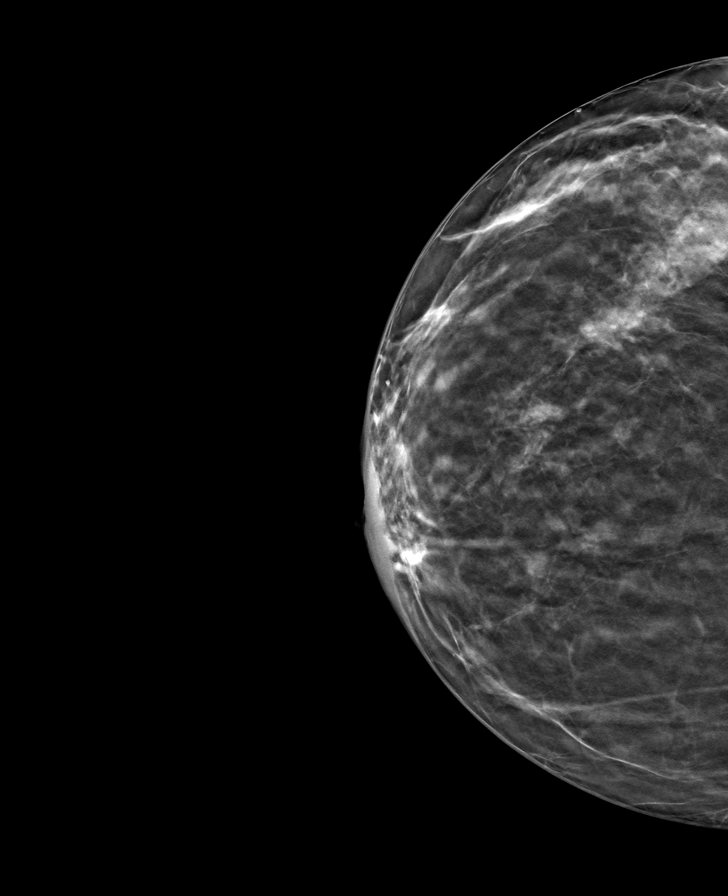

[8 of 40 positions shown; findings below may reference images not displayed]

ACR Breast Density Category c: The breast tissue is heterogeneously
dense, which may obscure small masses.
FINDINGS: There is thickening/prominence of the LEFT nipple. There are no
dominant masses, suspicious calcifications or secondary signs of
malignancy elsewhere within either breast.

Mammographic images were processed with CAD.

On physical exam, there is a visible prominence at the LEFT nipple
areolar complex.

Targeted ultrasound is performed, showing hypoechoic collection
within the LEFT nipple areolar complex, measuring 1.5 x 1.3 x
cm, most likely an obstructed [REDACTED] gland. No overlying skin
redness to suggest infectious collection/abscess. Underlying breast
tissues appear normal.
IMPRESSION: 1. Hypoechoic collection within the skin of the nipple-areolar
complex, measuring 1.5 cm greatest dimension, most suggestive of an
obstructed [REDACTED] gland. Patient describes associated pain.
There is no associated skin redness, skin warmth, fever or other
signs of superimposed infection.
2. No evidence of malignancy within either breast.

RECOMMENDATION:
1. Surgical consultation will be arranged for the patient.
2. Corresponding clinical follow-up for the collection at the LEFT
nipple-areolar complex.
3. Screening mammogram in one year.(Code:[id number]) as patient will
turn 38 next year.

I have discussed the findings and recommendations with the patient.
If applicable, a reminder letter will be sent to the patient
regarding the next appointment.

BI-RADS CATEGORY  2: Benign.

## 2021-02-13 IMAGING — US US BREAST CYST ASPIRATION 1ST CYST
1 series · 1 of 1 positions shown · non-contrast
Comparison: 03/27/2019
COMPARISON: 03/27/2019

Addendum:
CLINICAL DATA: Patient presents for aspiration of a left areolar
painful mass, noted on diagnostic imaging performed on 03/27/2019 to
be a collection within the nipple, areolar complex on the left,
measuring 1.5 cm in greatest dimension. This lesion has become more
painful.

EXAM:
ULTRASOUND GUIDED LEFT BREAST CYST ASPIRATION

[Series 1: us breast cyst aspiration 1st cyst · 0.07mm/px · 1 of 1 slices shown]
[im 1/1]
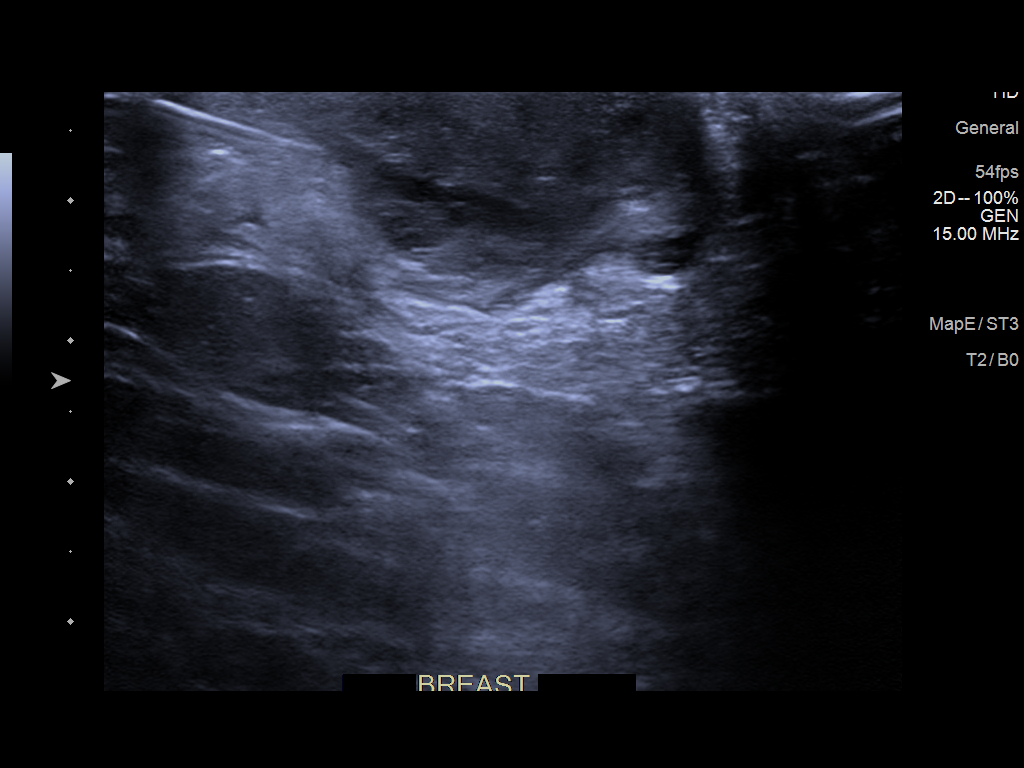

[1 of 1 positions shown; findings below may reference images not displayed]

PROCEDURE:
Using sterile technique, 1% lidocaine, under direct ultrasound
visualization, needle aspiration of the complex collection within
the skin and superficial tissue of the left areola complex was
performed. Approximately 3 mL of dark thick fluid was aspirated. No
additional fluid could be aspirated from the lesion.
IMPRESSION: Ultrasound-guided aspiration of a complex left areolar collection.
No apparent complications.

RECOMMENDATIONS:
1. Fluid was sent to the laboratory for Gram stain, culture and
sensitivity.
2. Patient is to follow-up with Dr. Sortuda.
3. Patient was placed on a course of Augmentin, 875 mg, 1 p.o.
b.i.d. for 10 days.

ADDENDUM:
PATHOLOGY revealed: Specimen Description ABSCESS LEFT BREAST.
Special Requests NONE. Gram Stain- ABUNDANT WBC PRESENT,BOTH PMN AND
MONONUCLEAR ABUNDANT GRAM POSITIVE COCCI. Culture NO GROWTH < 24
HOURS. Final report status -pending.

Pathology results are CONCORDANT with imaging findings, per Dr.
Magui Tawil.

Multiple attempts to contact patient were unsuccessful. Notified
patient's provider requesting they contact patient with biopsy
results. "Batina", at Dr. [REDACTED], will contact patient
to assess biopsy site and schedule follow up appointment with Dr.
Mee Luc to discuss surgical excision.

Recommendation: Follow up with Dr. Mee Luc to assess abscess
improvement with antibiotic therapy and need for surgical excision.

Addendum by Mee Luc RN on 04/02/2019.

*** End of Addendum ***
PROCEDURE:
Using sterile technique, 1% lidocaine, under direct ultrasound
visualization, needle aspiration of the complex collection within
the skin and superficial tissue of the left areola complex was
performed. Approximately 3 mL of dark thick fluid was aspirated. No
additional fluid could be aspirated from the lesion.
IMPRESSION: Ultrasound-guided aspiration of a complex left areolar collection.
No apparent complications.

RECOMMENDATIONS:
1. Fluid was sent to the laboratory for Gram stain, culture and
sensitivity.
2. Patient is to follow-up with Dr. Yuki Borkowski.
3. Patient was placed on a course of Augmentin, 875 mg, 1 p.o.
b.i.d. for 10 days.

## 2021-04-26 ENCOUNTER — Other Ambulatory Visit: Payer: Self-pay

## 2021-04-26 ENCOUNTER — Emergency Department
Admission: EM | Admit: 2021-04-26 | Discharge: 2021-04-26 | Disposition: A | Discharge status: Home / Self Care | Payer: Self-pay | Attending: Emergency Medicine | Admitting: Emergency Medicine

## 2021-04-26 ENCOUNTER — Emergency Department: Payer: Self-pay

## 2021-04-26 DIAGNOSIS — N611 Abscess of the breast and nipple: Secondary | ICD-10-CM | POA: Insufficient documentation

## 2021-04-26 DIAGNOSIS — Z79899 Other long term (current) drug therapy: Secondary | ICD-10-CM | POA: Insufficient documentation

## 2021-04-26 DIAGNOSIS — Z7982 Long term (current) use of aspirin: Secondary | ICD-10-CM | POA: Insufficient documentation

## 2021-04-26 DIAGNOSIS — R42 Dizziness and giddiness: Secondary | ICD-10-CM | POA: Insufficient documentation

## 2021-04-26 DIAGNOSIS — N3000 Acute cystitis without hematuria: Secondary | ICD-10-CM | POA: Insufficient documentation

## 2021-04-26 DIAGNOSIS — I10 Essential (primary) hypertension: Secondary | ICD-10-CM | POA: Insufficient documentation

## 2021-04-26 DIAGNOSIS — F17210 Nicotine dependence, cigarettes, uncomplicated: Secondary | ICD-10-CM | POA: Insufficient documentation

## 2021-04-26 LAB — URINALYSIS, ROUTINE W REFLEX MICROSCOPIC
Bilirubin Urine: NEGATIVE
Glucose, UA: NEGATIVE mg/dL
Hgb urine dipstick: NEGATIVE
Ketones, ur: 40 mg/dL — AB
Nitrite: POSITIVE — AB
Specific Gravity, Urine: >1.03 — ABNORMAL HIGH (ref 1.005–1.030)
pH: 5.5 (ref 5.0–8.0)

## 2021-04-26 LAB — BASIC METABOLIC PANEL
Anion gap: 12 (ref 5–15)
BUN: 17 mg/dL (ref 6–20)
CO2: 27 mmol/L (ref 22–32)
Calcium: 9.5 mg/dL (ref 8.9–10.3)
Chloride: 95 mmol/L — ABNORMAL LOW (ref 98–111)
Creatinine, Ser: 0.72 mg/dL (ref 0.44–1.00)
GFR, Estimated: >60 mL/min (ref >60)
Glucose, Bld: 96 mg/dL (ref 70–99)
Potassium: 2.9 mmol/L — ABNORMAL LOW (ref 3.5–5.1)
Sodium: 134 mmol/L — ABNORMAL LOW (ref 135–145)

## 2021-04-26 LAB — PREGNANCY, URINE: Preg Test, Ur: NEGATIVE

## 2021-04-26 LAB — CBC
HCT: 39.3 % (ref 36.0–46.0)
Hemoglobin: 13.6 g/dL (ref 12.0–15.0)
MCH: 34.6 pg — ABNORMAL HIGH (ref 26.0–34.0)
MCHC: 34.6 g/dL (ref 30.0–36.0)
MCV: 100 fL (ref 80.0–100.0)
Platelets: 229 10*3/uL (ref 150–400)
RBC: 3.93 MIL/uL (ref 3.87–5.11)
RDW: 14.7 % (ref 11.5–15.5)
WBC: 8.1 10*3/uL (ref 4.0–10.5)
nRBC: 0 % (ref 0.0–0.2)

## 2021-04-26 MED ORDER — POTASSIUM CHLORIDE CRYS ER 20 MEQ PO TBCR: 20.0000 meq | EXTENDED_RELEASE_TABLET | Freq: Every day | ORAL | 0 refills | Status: AC

## 2021-04-26 MED ORDER — POTASSIUM CHLORIDE CRYS ER 20 MEQ PO TBCR: 40.0000 meq | EXTENDED_RELEASE_TABLET | Freq: Two times a day (BID) | ORAL | Status: DC

## 2021-04-26 MED ORDER — BENAZEPRIL HCL 20 MG PO TABS: 20.0000 mg | ORAL_TABLET | Freq: Every day | ORAL | 11 refills | Status: AC

## 2021-04-26 MED ORDER — POTASSIUM CHLORIDE CRYS ER 20 MEQ PO TBCR
40.0000 meq | EXTENDED_RELEASE_TABLET | Freq: Once | ORAL | Status: AC
  Administered 2021-04-26: 40 meq via ORAL
  Filled 2021-04-26: qty 2

## 2021-04-26 MED ORDER — CLINDAMYCIN HCL 150 MG PO CAPS
300.0000 mg | ORAL_CAPSULE | Freq: Once | ORAL | Status: AC
  Administered 2021-04-26: 300 mg via ORAL
  Filled 2021-04-26: qty 2

## 2021-04-26 MED ORDER — SULFAMETHOXAZOLE-TRIMETHOPRIM 800-160 MG PO TABS: 1.0000 | ORAL_TABLET | Freq: Two times a day (BID) | ORAL | 0 refills | Status: AC

## 2021-04-26 NOTE — ED Triage Notes (Signed)
Pt here with dizziness, blurred vision and an abscess on her left breast. Pt states that her symptoms started on Sunday. Pt states that her blurred vision has resolved but she is still very dizzy. Pt has nausea but denies vomiting or diarrhea. Pt states that her abscess is tender to touch. Pt in NAD in triage.

## 2021-04-26 NOTE — Discharge Instructions (Addendum)
It is very very important for you to have breast ultrasound as abscesses that involve nipple can be serious and require surgery. Please call [facility] for appointment.  Please start Bactrim 2 times daily for the next 7 days and start probiotic.  Take potassium once daily for five days.

## 2021-04-26 NOTE — ED Provider Notes (Signed)
[facility]-EMERGENCY DEPARTMENT  ____________________________________________  Time seen: Approximately 10:41 PM  I have reviewed the triage vital signs and the nursing notes.   HISTORY  Chief Complaint Dizziness and Abscess   Historian Patient     HPI [name] is a 40 y.o. female presents to the emergency department with dizziness and concern for an abscess of the left breast that involves the nipple.  Patient also states that she has had some dysuria and suprapubic pain.  She states that she has had multiple breast abscesses in the past that have required surgery.  She is declining ultrasound work-up in the emergency department stating that she wants to try a trial of antibiotics first.  No chest tightness, shortness of breath or vomiting at home.   Past Medical History:  Diagnosis Date   Hyperlipidemia    Hypertension    Myocardial infarction [facility])    2014     Immunizations up to date:  Yes.     Past Medical History:  Diagnosis Date   Hyperlipidemia    Hypertension    Myocardial infarction [facility])    2014    Patient Active Problem List   Diagnosis Date Noted   Non-ST elevation (NSTEMI) myocardial infarction ([place]) 10/05/2018   Hypertension    Hypokalemia    Trimalleolar fracture of ankle, closed 02/28/2015   NSTEMI (non-ST elevated myocardial infarction) ([place]) 10/14/2014    Past Surgical History:  Procedure Laterality Date   BREAST CYST ASPIRATION Bilateral    CARDIAC CATHETERIZATION N/A 10/15/2014   Procedure: Left Heart Cath;  Surgeon: Teodoro Spray, MD;  Location: [place] [facility];  Service: Cardiovascular;  Laterality: N/A;   CARDIAC SURGERY  Cardiac stent to right coronary artery   CORONARY STENT INTERVENTION N/A 10/07/2018   Procedure: CORONARY STENT INTERVENTION;  Surgeon: Nelva Bush, MD;  Location: [place] [facility];  Service: Cardiovascular;  Laterality: N/A;   CORONARY/GRAFT ACUTE MI REVASCULARIZATION N/A 10/04/2018   Procedure:  Coronary/Graft Acute MI Revascularization;  Surgeon: Wellington Hampshire, MD;  Location: [place] [facility];  Service: Cardiovascular;  Laterality: N/A;   LEFT HEART CATH AND CORONARY ANGIOGRAPHY N/A 10/04/2018   Procedure: LEFT HEART CATH AND CORONARY ANGIOGRAPHY;  Surgeon: Wellington Hampshire, MD;  Location: [place] [facility];  Service: Cardiovascular;  Laterality: N/A;   ORIF ANKLE FRACTURE Right 03/01/2015   Procedure: OPEN REDUCTION INTERNAL FIXATION (ORIF) ANKLE FRACTURE;  Surgeon: Hessie Knows, MD;  Location: [facility] OR[facility];  Service: Orthopedics;  Laterality: Right;   STENT PLACEMENT VASCULAR ([place] [facility])     TUBAL LIGATION  2004    Prior to Admission medications   Medication Sig Start Date End Date Taking? Authorizing Provider  benazepril (LOTENSIN) 20 MG tablet Take 1 tablet (20 mg total) by mouth daily. 04/26/21 04/26/22 Yes Vallarie Mare M, PA-C  potassium chloride SA (KLOR-CON M) 20 MEQ tablet Take 1 tablet (20 mEq total) by mouth daily for 5 days. 04/26/21 05/01/21 Yes Vallarie Mare M, PA-C  sulfamethoxazole-trimethoprim (BACTRIM DS) 800-160 MG tablet Take 1 tablet by mouth 2 (two) times daily for 7 days. 04/26/21 05/03/21 Yes Vallarie Mare M, PA-C  Albuterol Sulfate (PROAIR RESPICLICK) 123XX123 (90 Base) MCG/ACT AEPB Inhale 2 puffs into the lungs every 6 (six) hours. 06/03/18   Fisher, Linden Dolin, PA-C  amLODipine (NORVASC) 10 MG tablet Take 1 tablet (10 mg total) by mouth daily. 12/02/18   Rise Mu, PA-C  amoxicillin-clavulanate (AUGMENTIN) 875-125 MG tablet Take 1 tablet by mouth 2 (two)  times daily. 03/31/19   [provider]  aspirin EC 81 MG tablet Take 1 tablet (81 mg total) by mouth daily. 10/16/14   Enid Baas, MD  atorvastatin (LIPITOR) 80 MG tablet Take 1 tablet (80 mg total) by mouth daily. 06/03/18   Sherrie Mustache Roselyn Bering, PA-C  carvedilol (COREG) 25 MG tablet Take 1 tablet (25 mg total) by mouth 2 (two) times daily with a meal. 12/18/18   Dunn, Raymon Mutton, PA-C  citalopram (CELEXA) 20 MG  tablet Take 20 mg by mouth daily.  10/15/18 10/15/19  [provider]  clopidogrel (PLAVIX) 75 MG tablet Take 1 tablet (75 mg total) by mouth daily. 10/31/18   Iran Ouch, MD  hydrochlorothiazide (HYDRODIURIL) 25 MG tablet Take 1 tablet (25 mg total) by mouth daily. 06/03/18   Fisher, Roselyn Bering, PA-C  nicotine (NICODERM CQ) 21 mg/24hr patch Place 1 patch (21 mg total) onto the skin daily. 10/08/18   Adrian Saran, MD  nitroGLYCERIN (NITROSTAT) 0.4 MG SL tablet Place 1 tablet (0.4 mg total) under the tongue every 5 (five) minutes as needed. 10/08/18 10/08/19  Adrian Saran, MD  oxyCODONE-acetaminophen (PERCOCET) 5-325 MG tablet Take 1 tablet by mouth every 6 (six) hours as needed for severe pain. 01/22/19   Tommi Rumps, PA-C  spironolactone (ALDACTONE) 25 MG tablet Take 1 tablet (25 mg total) by mouth daily. 02/03/19 05/04/19  Iran Ouch, MD    Allergies Patient has no known allergies.  Family History  Problem Relation Age of Onset   Diabetes Mother    Hypertension Mother    Diabetes Father    Hypertension Father    Heart attack Sister    Breast cancer Maternal Aunt 18   Breast cancer Maternal Grandmother 33   Breast cancer [place]     Social History Social History   Tobacco Use   Smoking status: Every Day    Packs/day: 1.00    Types: Cigarettes   Smokeless tobacco: Never  Vaping Use   Vaping Use: Never used  Substance Use Topics   Alcohol use: Yes    Comment: occas   Drug use: No     Review of Systems  Constitutional: No fever/chills Eyes:  No discharge ENT: No upper respiratory complaints. Respiratory: no cough. No SOB/ use of accessory muscles to breath Gastrointestinal:   No nausea, no vomiting.  No diarrhea.  No constipation. Genitourinary: Patient has dysuria.  Musculoskeletal: Negative for musculoskeletal pain. Skin: Patient has left breat pain.    ____________________________________________   PHYSICAL EXAM:  VITAL SIGNS: ED Triage Vitals   Enc Vitals Group     BP 04/26/21 1211 (!) 179/117     Pulse Rate 04/26/21 1210 (!) 107     Resp 04/26/21 1210 18     Temp 04/26/21 1210 98.6 F (37 C)     Temp Source 04/26/21 1803 Oral     SpO2 04/26/21 1210 100 %     Weight 04/26/21 1210 165 lb (74.8 kg)     Height 04/26/21 1210 5\' 2"  (1.575 m)     Head Circumference --      Peak Flow --      Pain Score 04/26/21 1210 10     Pain Loc --      Pain Edu? --      Excl. in GC? --      Constitutional: Alert and oriented. Well appearing and in no acute distress. Eyes: Conjunctivae are normal. PERRL. EOMI. Head: Atraumatic. ENT:  Nose: No congestion/rhinnorhea.      Mouth/Throat: Mucous membranes are moist.  Neck: No stridor.  No cervical spine tenderness to palpation. Cardiovascular: Normal rate, regular rhythm. Normal S1 and S2.  Good peripheral circulation. Respiratory: Normal respiratory effort without tachypnea or retractions. Lungs CTAB. Good air entry to the bases with no decreased or absent breath sounds Gastrointestinal: Bowel sounds x 4 quadrants. Soft and nontender to palpation. No guarding or rigidity. No distention. Musculoskeletal: Full range of motion to all extremities. No obvious deformities noted Neurologic:  Normal for age. No gross focal neurologic deficits are appreciated.  Skin: Patient has erythema and induration of the areola and distention of the left nipple. Psychiatric: Mood and affect are normal for age. Speech and behavior are normal.   ____________________________________________   LABS (all labs ordered are listed, but only abnormal results are displayed)  Labs Reviewed  BASIC METABOLIC PANEL - Abnormal; Notable for the following components:      Result Value   Sodium 134 (*)    Potassium 2.9 (*)    Chloride 95 (*)    All other components within normal limits  CBC - Abnormal; Notable for the following components:   MCH 34.6 (*)    All other components within normal limits  URINALYSIS,  ROUTINE W REFLEX MICROSCOPIC - Abnormal; Notable for the following components:   APPearance CLOUDY (*)    Specific Gravity, Urine >1.030 (*)    Ketones, ur 40 (*)    Protein, ur TRACE (*)    Nitrite POSITIVE (*)    Leukocytes,Ua TRACE (*)    Bacteria, UA MANY (*)    All other components within normal limits  PREGNANCY, URINE  CBG MONITORING, ED  POC URINE PREG, ED   ____________________________________________  EKG   ____________________________________________  RADIOLOGY Unk Pinto, personally viewed and evaluated these images (plain radiographs) as part of my medical decision making, as well as reviewing the written report by the radiologist.  CT HEAD WO CONTRAST  Result Date: 04/26/2021 CLINICAL DATA:  Nonspecific dizziness EXAM: CT HEAD WITHOUT CONTRAST TECHNIQUE: Contiguous axial images were obtained from the base of the skull through the vertex without intravenous contrast. COMPARISON:  07/19/2014 FINDINGS: Brain: The brain shows a normal appearance without evidence of malformation, atrophy, old or acute small or large vessel infarction, mass lesion, hemorrhage, hydrocephalus or extra-axial collection. Vascular: No hyperdense vessel. No evidence of atherosclerotic calcification. Skull: Normal.  No traumatic finding.  No focal bone lesion. Sinuses/Orbits: Sinuses are clear. Orbits appear normal. Mastoids are clear. Other: There is an abnormality at the petrous apex on the right that could be either trapped fluid or a cholesterol granuloma. Consider MRI of the brain electively for further evaluation. I would not expect this to relate to the acute presentation of dizziness. This appears only minimally larger when compared to the study of 2016. IMPRESSION: Normal appearance of the brain itself. No abnormality seen to explain acute dizziness. Chronic abnormality of the petrous apex on the right, slightly larger than was seen in 2016, which could be trapped fluid or possibly a  cholesterol granuloma. Slow enlargement argues against clinical significance. However, MRI of the brain could be considered for better characterization. Electronically Signed   By: Nelson Chimes M.D.   On: 04/26/2021 13:13    ____________________________________________    PROCEDURES  Procedure(s) performed:     Procedures     Medications  clindamycin (CLEOCIN) capsule 300 mg (300 mg Oral Given 04/26/21 1835)  potassium chloride SA (  KLOR-CON M) CR tablet 40 mEq (40 mEq Oral Given 04/26/21 1836)     ____________________________________________   INITIAL IMPRESSION / ASSESSMENT AND PLAN / ED COURSE  Pertinent labs & imaging results that were available during my care of the patient were reviewed by me and considered in my medical decision making (see chart for details).    Assessment and plan Breast abscess UTI 40 year old female presents to the emergency department with concern for dizziness, left breast abscess and possible UTI.  Patient denied breast ultrasound in the emergency department.  Explained to patient that we could not perform an intervention without appropriate ultrasound given areolar/nipple involvement.  Patient stated that she wanted to try a trial of antibiotics first.  Patient had hypokalemia noted on BMP and was given 40 mEq of potassium in the emergency department and discharged with oral potassium once daily for the next 5 days.  She was discharged with Bactrim which will cover her for UTI and MRSA.  She was given a referral to the normal breast center and was advised to return to the emergency department if symptoms seem to be worsening at home.  She voiced understanding regarding these recommendations/instructions.      ____________________________________________  FINAL CLINICAL IMPRESSION(S) / ED DIAGNOSES  Final diagnoses:  Dizziness  Acute cystitis without hematuria  Breast abscess      NEW MEDICATIONS STARTED DURING THIS VISIT:  ED  Discharge Orders          Ordered    benazepril (LOTENSIN) 20 MG tablet  Daily        04/26/21 1820    sulfamethoxazole-trimethoprim (BACTRIM DS) 800-160 MG tablet  2 times daily        04/26/21 1850    potassium chloride SA (KLOR-CON M) 20 MEQ tablet  Daily        04/26/21 1908                This chart was dictated using voice recognition software/Dragon. Despite best efforts to proofread, errors can occur which can change the meaning. Any change was purely unintentional.     Lannie Fields, PA-C 04/26/21 2245    Duffy Bruce, MD 04/27/21 1105

## 2021-10-20 ENCOUNTER — Ambulatory Visit: Payer: Self-pay | Admitting: Internal Medicine

## 2021-10-20 NOTE — Progress Notes (Deleted)
   Follow-up Outpatient Visit Date: 10/20/2021  Primary Care Provider: [facility] [address] [place] [address]  Chief Complaint: ***  HPI:  [name] is a 41 y.o. female with history of coronary artery disease status post multiple PCI's, who presents for follow-up of ***.  --------------------------------------------------------------------------------------------------  ***  Recent CV Pertinent Labs: Lab Results  Component Value Date   CHOL 220 (H) 10/06/2018   CHOL 214 (H) 01/08/2014   HDL 52 10/06/2018   HDL 42 01/08/2014   LDLCALC 128 (H) 10/06/2018   LDLCALC 116 (H) 01/08/2014   TRIG 201 (H) 10/06/2018   TRIG 280 (H) 01/08/2014   CHOLHDL 4.2 10/06/2018   INR 0.9 10/04/2018   INR 1.0 06/21/2013   BNP 430 (H) 06/15/2013   K 2.9 (L) 04/26/2021   K 3.4 (L) 01/07/2014   MG 2.1 10/06/2018   MG 1.7 (L) 06/21/2013   BUN 17 04/26/2021   BUN 15 04/14/2019   BUN 19 (H) 01/07/2014   CREATININE 0.72 04/26/2021   CREATININE 0.86 01/07/2014    Past medical and surgical history were reviewed and updated in EPIC.  No outpatient medications have been marked as taking for the 10/20/21 encounter (Appointment) with Aliany Fiorenza, Cristal Deer, MD.    Allergies: Patient has no known allergies.  Social History   Tobacco Use   Smoking status: Every Day    Packs/day: 1.00    Types: Cigarettes   Smokeless tobacco: Never  Vaping Use   Vaping Use: Never used  Substance Use Topics   Alcohol use: Yes    Comment: occas   Drug use: No    Family History  Problem Relation Age of Onset   Diabetes Mother    Hypertension Mother    Diabetes Father    Hypertension Father    Heart attack Sister    Breast cancer Maternal Aunt 40   Breast cancer Maternal Grandmother 8   Breast cancer [place]     Review of Systems: A 12-system review of systems was performed and was negative except as noted in the  HPI.  --------------------------------------------------------------------------------------------------  Physical Exam: There were no vitals taken for this visit.  General:  NAD. Neck: No JVD or HJR. Lungs: Clear to auscultation bilaterally without wheezes or crackles. Heart: Regular rate and rhythm without murmurs, rubs, or gallops. Abdomen: Soft, nontender, nondistended. Extremities: No lower extremity edema.  EKG:  ***  Lab Results  Component Value Date   WBC 8.1 04/26/2021   HGB 13.6 04/26/2021   HCT 39.3 04/26/2021   MCV 100.0 04/26/2021   PLT 229 04/26/2021    Lab Results  Component Value Date   NA 134 (L) 04/26/2021   K 2.9 (L) 04/26/2021   CL 95 (L) 04/26/2021   CO2 27 04/26/2021   BUN 17 04/26/2021   CREATININE 0.72 04/26/2021   GLUCOSE 96 04/26/2021   ALT 11 10/04/2018    Lab Results  Component Value Date   CHOL 220 (H) 10/06/2018   HDL 52 10/06/2018   LDLCALC 128 (H) 10/06/2018   TRIG 201 (H) 10/06/2018   CHOLHDL 4.2 10/06/2018    --------------------------------------------------------------------------------------------------  ASSESSMENT AND PLAN: Monica Kendall, MD 10/20/2021 6:56 AM

## 2023-06-02 ENCOUNTER — Emergency Department: Payer: Medicaid Other

## 2023-06-02 ENCOUNTER — Emergency Department
Admission: EM | Admit: 2023-06-02 | Discharge: 2023-06-03 | Disposition: A | Discharge status: Home / Self Care | Payer: Medicaid Other | Attending: Emergency Medicine | Admitting: Emergency Medicine

## 2023-06-02 DIAGNOSIS — I10 Essential (primary) hypertension: Secondary | ICD-10-CM | POA: Diagnosis present

## 2023-06-02 DIAGNOSIS — I2510 Atherosclerotic heart disease of native coronary artery without angina pectoris: Secondary | ICD-10-CM | POA: Insufficient documentation

## 2023-06-02 DIAGNOSIS — R7989 Other specified abnormal findings of blood chemistry: Secondary | ICD-10-CM | POA: Insufficient documentation

## 2023-06-02 DIAGNOSIS — I160 Hypertensive urgency: Secondary | ICD-10-CM | POA: Insufficient documentation

## 2023-06-02 LAB — COMPREHENSIVE METABOLIC PANEL
ALT: 12 U/L (ref 0–44)
AST: 31 U/L (ref 15–41)
Albumin: 3.8 g/dL (ref 3.5–5.0)
Alkaline Phosphatase: 70 U/L (ref 38–126)
Anion gap: 14 (ref 5–15)
BUN: 15 mg/dL (ref 6–20)
CO2: 26 mmol/L (ref 22–32)
Calcium: 9.4 mg/dL (ref 8.9–10.3)
Chloride: 98 mmol/L (ref 98–111)
Creatinine, Ser: 0.81 mg/dL (ref 0.44–1.00)
GFR, Estimated: >60 mL/min (ref >60)
Glucose, Bld: 80 mg/dL (ref 70–99)
Potassium: 3.7 mmol/L (ref 3.5–5.1)
Sodium: 138 mmol/L (ref 135–145)
Total Bilirubin: 0.5 mg/dL (ref 0.0–1.2)
Total Protein: 8.2 g/dL — ABNORMAL HIGH (ref 6.5–8.1)

## 2023-06-02 LAB — CBC
HCT: 40.4 % (ref 36.0–46.0)
Hemoglobin: 13.6 g/dL (ref 12.0–15.0)
MCH: 34 pg (ref 26.0–34.0)
MCHC: 33.7 g/dL (ref 30.0–36.0)
MCV: 101 fL — ABNORMAL HIGH (ref 80.0–100.0)
Platelets: 278 10*3/uL (ref 150–400)
RBC: 4 MIL/uL (ref 3.87–5.11)
RDW: 16.7 % — ABNORMAL HIGH (ref 11.5–15.5)
WBC: 8.7 10*3/uL (ref 4.0–10.5)
nRBC: 0 % (ref 0.0–0.2)

## 2023-06-02 LAB — TROPONIN I (HIGH SENSITIVITY): Troponin I (High Sensitivity): 27 ng/L — ABNORMAL HIGH (ref <18)

## 2023-06-02 MED ORDER — METOPROLOL SUCCINATE ER 50 MG PO TB24
200.0000 mg | ORAL_TABLET | ORAL | Status: AC
  Administered 2023-06-03: 200 mg via ORAL
  Filled 2023-06-02: qty 4

## 2023-06-02 MED ORDER — AMLODIPINE BESYLATE 5 MG PO TABS
10.0000 mg | ORAL_TABLET | ORAL | Status: AC
  Administered 2023-06-03: 10 mg via ORAL
  Filled 2023-06-02: qty 2

## 2023-06-02 MED ORDER — BENAZEPRIL HCL 20 MG PO TABS
20.0000 mg | ORAL_TABLET | ORAL | Status: AC
  Administered 2023-06-03: 20 mg via ORAL
  Filled 2023-06-02: qty 1

## 2023-06-02 MED ORDER — METOPROLOL TARTRATE 50 MG PO TABS
100.0000 mg | ORAL_TABLET | Freq: Once | ORAL | Status: AC
Start: 2023-06-02 — End: 2023-06-02
  Administered 2023-06-02: 100 mg via ORAL
  Filled 2023-06-02: qty 2

## 2023-06-02 MED ORDER — LABETALOL HCL 5 MG/ML IV SOLN
10.0000 mg | Freq: Once | INTRAVENOUS | Status: AC
  Administered 2023-06-02: 10 mg via INTRAVENOUS
  Filled 2023-06-02: qty 4

## 2023-06-02 MED ORDER — SPIRONOLACTONE 25 MG PO TABS
25.0000 mg | ORAL_TABLET | ORAL | Status: AC
  Administered 2023-06-03: 25 mg via ORAL
  Filled 2023-06-02: qty 1

## 2023-06-02 NOTE — ED Provider Notes (Signed)
 [facility] Provider Note   Event Date/Time   First MD Initiated Contact with Patient 06/02/23 2116     (approximate) History  Chest Pain  HPI [name] is a 43 y.o. female with a past medical history of CAD status post multiple stents and poorly controlled hypertension who presents complaining of chest pain, hypertension.  EMS administered 2 inches of Nitropaste on scene.  Patient also complains of blurred peripheral vision. ROS: Patient currently denies any tinnitus, difficulty speaking, facial droop, sore throat, shortness of breath, abdominal pain, nausea/vomiting/diarrhea, dysuria, or weakness/numbness/paresthesias in any extremity   Physical Exam  Triage Vital Signs: ED Triage Vitals  Encounter Vitals Group     BP      Systolic BP Percentile      Diastolic BP Percentile      Pulse      Resp      Temp      Temp src      SpO2      Weight      Height      Head Circumference      Peak Flow      Pain Score      Pain Loc      Pain Education      Exclude from Growth Chart    Most recent vital signs: Vitals:   06/02/23 2130 06/02/23 2200  BP: (!) 214/120 (!) 179/102  Pulse: (!) 115 92  Resp: (!) 23 (!) 21  Temp:    SpO2: 98% 94%   General: Awake, oriented x4. CV:  Good peripheral perfusion.  Resp:  Normal effort.  Abd:  No distention.  Other:  Middle-aged obese African-American female resting comfortably in no acute distress ED Results / Procedures / Treatments  Labs (all labs ordered are listed, but only abnormal results are displayed) Labs Reviewed  CBC - Abnormal; Notable for the following components:      Result Value   MCV 101.0 (*)    RDW 16.7 (*)    All other components within normal limits  COMPREHENSIVE METABOLIC PANEL - Abnormal; Notable for the following components:   Total Protein 8.2 (*)    All other components within normal limits  TROPONIN I (HIGH SENSITIVITY) - Abnormal; Notable for the following components:    Troponin I (High Sensitivity) 27 (*)    All other components within normal limits  CBG MONITORING, ED  POC URINE PREG, ED  TROPONIN I (HIGH SENSITIVITY)   EKG ED ECG REPORT I, Artist MARLA Kerns, the attending physician, personally viewed and interpreted this ECG. Date: 06/02/2023 EKG Time: 2124 Rate: 117 Rhythm: tachycardic sinus rhythm QRS Axis: normal Intervals: normal ST/T Wave abnormalities: normal Narrative Interpretation: tachycardic sinus rhythm. no evidence of acute ischemia RADIOLOGY ED MD interpretation: One-view portable chest x-ray interpreted by me shows no evidence of acute abnormalities including no pneumonia, pneumothorax, or widened mediastinum -Agree with radiology assessment Official radiology report(s): DG Chest Portable 1 View Result Date: 06/02/2023 CLINICAL DATA:  Chest pain and pressure. EXAM: PORTABLE CHEST 1 VIEW COMPARISON:  None Available. FINDINGS: Stable cardiomediastinal silhouette. Coronary stenting. Aortic atherosclerotic calcification. No focal consolidation, pleural effusion, or pneumothorax. No displaced rib fractures. IMPRESSION: No acute cardiopulmonary disease. Electronically Signed   By: Norman Gatlin M.D.   On: 06/02/2023 21:57   PROCEDURES: Critical Care performed: No Procedures MEDICATIONS ORDERED IN ED: Medications  labetalol  (NORMODYNE ) injection 10 mg (10 mg Intravenous Given 06/02/23 2142)  metoprolol  tartrate (LOPRESSOR ) tablet 100  mg (100 mg Oral Given 06/02/23 2224)   IMPRESSION / MDM / ASSESSMENT AND PLAN / ED COURSE  I reviewed the triage vital signs and the nursing notes.                             The patient is on the cardiac monitor to evaluate for evidence of arrhythmia and/or significant heart rate changes. Patient's presentation is most consistent with acute presentation with potential threat to life or bodily function. Workup: ECG, CXR, CBC, BMP, Troponin Findings: ECG: No overt evidence of STEMI. No evidence of Brugada's  sign, delta wave, epsilon wave, significantly prolonged QTc, or malignant arrhythmia HS Troponin: Negative x1 Other Labs unremarkable for emergent problems. CXR: Without PTX, PNA, or widened mediastinum  Care of this patient will be signed out to the oncoming physician at the end of my shift.  All pertinent patient information conveyed and all questions answered.  All further care and disposition decisions will be made by the oncoming physician. Clinical Course as of 06/04/23 2247  Mon Jun 03, 2023  0117 Troponin I (High Sensitivity)(!): 29 Essentially unchanged repeat troponin.  Patient's BP much improved.  Reassessed her, she is asymptomatic. Agrees with the plan for discharge, acknowledges need to take her outpatient antihypertensives.  Will follow up with [facility] doctor at [facility]. [CF]    Clinical Course User Index [CF] Gordan Huxley, MD   FINAL CLINICAL IMPRESSION(S) / ED DIAGNOSES   Final diagnoses:  None   Rx / DC Orders   ED Discharge Orders     None      Note:  This document was prepared using Dragon voice recognition software and may include unintentional dictation errors.   Zyan Coby K, MD 06/04/23 [phone]

## 2023-06-02 NOTE — ED Triage Notes (Signed)
 Coming from home. C/o chest pain/ pressure. Extensive cardiac hx. States having pain similar to previous MI. Hypertensive on EMS arrival 248/138, gave 2 inches of nitroglycerin paste on scene. Also c/o blurred peripheral vision

## 2023-06-02 NOTE — ED Provider Notes (Signed)
-----------------------------------------   11:38 PM on 06/02/2023 -----------------------------------------  Assuming care from Dr. Jossie.  In short, [name] is a 43 y.o. female with a chief complaint of hypertension.  Refer to the original H&P for additional details.  The current plan of care is to follow up on repeat troponin and determine appropriateness for discharge.  History of medical noncompliance, ordered her home meds as listed from [facility] (she reported to Dr. Jossie that she has her meds at home, but she has not taken them).   Clinical Course as of 06/03/23 0119  Mon Jun 03, 2023  0117 Troponin I (High Sensitivity)(!): 29 Essentially unchanged repeat troponin.  Patient's BP much improved.  Reassessed her, she is asymptomatic. Agrees with the plan for discharge, acknowledges need to take her outpatient antihypertensives.  Will follow up with her doctor at [facility]. [CF]    Clinical Course User Index [CF] Gordan Huxley, MD     Medications  labetalol  (NORMODYNE ) injection 10 mg (10 mg Intravenous Given 06/02/23 2142)  metoprolol  tartrate (LOPRESSOR ) tablet 100 mg (100 mg Oral Given 06/02/23 2224)  metoprolol  succinate (TOPROL -XL) 24 hr tablet 200 mg (200 mg Oral Given 06/03/23 0001)  amLODipine  (NORVASC ) tablet 10 mg (10 mg Oral Given 06/03/23 0001)  benazepril  (LOTENSIN ) tablet 20 mg (20 mg Oral Given 06/03/23 0001)  spironolactone  (ALDACTONE ) tablet 25 mg (25 mg Oral Given 06/03/23 0001)     ED Discharge Orders     None      Final diagnoses:  Hypertensive urgency  Elevated troponin level     Gordan Huxley, MD 06/03/23 [phone]

## 2023-06-03 LAB — TROPONIN I (HIGH SENSITIVITY): Troponin I (High Sensitivity): 29 ng/L — ABNORMAL HIGH (ref <18)

## 2023-06-03 NOTE — Discharge Instructions (Signed)
As we discussed, though you do have high blood pressure (hypertension), fortunately it is not immediately dangerous at this time and does not need emergency intervention or admission to the hospital.  If we add to or change your regular medications, we may cause more harm than good - it is more appropriate for your primary care doctor to evaluate you in clinic and decide if any medication changes are needed.  Please follow up in clinic as recommended in these papers.   ° °Return to the Emergency Department (ED) if you experience any worsening chest pain/pressure/tightness, difficulty breathing, or sudden sweating, or other symptoms that concern you. °

## 2024-04-28 NOTE — Progress Notes (Signed)
 Transitions of Care Note  Subjective   Admission Date: 04/17/24 Discharge Date: 04/21/24 Discharge Hospital/Unit: 3 [facility] to her home.  Post discharge interactive communication via telephone was made with patient on 11/26 and I have reviewed the information from that communication.  Today's (04/29/2024) face to face interactive visit is within 7 days days of discharge.  Chief Complaint: Patient is here in follow-up to their recent hospitalization and to discuss the following medical problems: Type 1 NSTEMI  HISTORY OF PRESENT ILLNESS:  [name] is a 43 y.o. female who presents for transitions hospital follow up.  Hospitalized for: Type 1 NSTEMI  Interval update:  History of Present Illness   She has been experiencing symptoms of an upper respiratory infection, including feeling feverish, cough,loss of voice, runny nose, and congestion. These symptoms started the day she left the hospital  She reports persistent right ear pain. No facial pain or shortness of breath is associated with her current symptoms. She does not relate these issues to her recent hospitalization.  She is currently working on smoking cessation and is motivated to stop.  Her chest pain has significantly improved since her discharge. She is taking her meds as prescribed.  Pt reported factors contributing to admission or ED visit: None   The remaining 10 systems reviewed were negative.   Patient has been seen by the pharmacist today and I have reviewed their note and recommendations. See pharmacist note for details. Patient has been seen by the social worker today and I have reviewed their note and recommendations.  See social work note for details.  I have reviewed the patients discharge summary for this hospitalization.  I have also reviewed the problem list, allergies, family and social history and updated them as  needed.     Objective  [name]  weight is 65.1 kg (143 lb 9.6 oz). Her temporal temperature is 36.7 C (98 F). Her blood pressure is 144/89 and her pulse is 113.   GEN: well appearing, NAD   HEENT: NCAT, No scleral icterus. Conjunctiva non-erythematous. MMM. R TM is erytematous and bulging. L TM with air fluid level but not bulging. CV: Tachycardic.  Pulm: Normal work of breathing on RA. CTAB. No wheezing, crackles, or rhonchi.  Abd: Flat.  Nontender. No guarding, rebound.  Normoactive bowel sounds.    Neuro: A&O x 3. No focal deficits.  Ext: No peripheral edema.  Palpable distal pulses.  Skin: No obvious rashes or skin lesions.  Labs:  I have reviewed the labs from this hospitalization and the ones on the day of discharge and have followed up on any pending labs at the time of discharge.  See Epic Labs section for details.     Assessment/Plan:  Problem List Items Addressed This Visit       Cardiovascular and Mediastinum   Hypertension   BP goal is <130/90 BP Readings from Last 3 Encounters:  04/28/24 144/89  04/21/24 123/74  12/30/23 159/87   Current Anti-Hypertensive Medications: Metoprolol  200mg , amlodipine  10mg , chlorthalidone 25mg , lisinopril  40mg , spironolactone  25  Today's Recommendations: - BP is above goal today but she certainly seems to have an infection which may be contributing some therefore will not change any meds today. Has cardiology follow up in 48 hours - Last BMP was WNL, will repeat today - Recommend continued regular exercise as tolerated. - Recommend moderation of sodium and carbohydrate rich foods.  Relevant Orders   Basic Metabolic Panel (Completed)   Magnesium  Level (Completed)   NSTEMI (non-ST elevated myocardial infarction) (CMS-HCC) - Primary   Doing well post discharge and PCI. Following with Cardiology and has follow up in 2 days. Encouraged med adherence and smoking cessation today.  Resent referral for Cardiac Rehab today.  Reviewed ED precautions for recurrent chest pain.        Relevant Orders   Basic Metabolic Panel (Completed)   Magnesium  Level (Completed)   Ambulatory Referral to Cardiac Rehab     Other   Tobacco use disorder (Chronic)   Highly motivated to quit after this hospitalization and she has significantly cut back, congratulated her on her success. Continue NRT and will start Chantix today.       Relevant Medications   varenicline tartrate (CHANTIX PAK) 0.5 mg (11)- 1 mg (42) tablet   Other Relevant Orders   Ambulatory referral for Smoking Cessation   Hypomagnesemia   Other Visit Diagnoses       Right acute otitis media       Non perforated. Start 7 day course of augmentin. Reviewed OTC meds that are safe with her underlying cardiovascular disease   Relevant Medications   amoxicillin -clavulanate (AUGMENTIN) 875-125 mg per tablet       Medications prescribed or ordered upon discharge were reviewed on 04/28/24 and reconciled with the most recent outpatient medication list. I have reviewed and agree with this medication reconciliation.   The following medications changes were made:  Start augmentin for AOM Start Chantix for smoking cessation  Biggest Risk for Readmission:  Recurrent heart attack  Items to follow-up on next visit:  [ ]  BP check, above goal today [ ]  Repeat labs, has hypomagnesemia [ ]  Smoking cessation  Medical decision making was of high [phone]- must be seen within 7 days) complexity.  Necessary referral have been made.  See Visit Summary for details of referrals.  I will forward my plan and recommendations to patients PCP, Leonce Zebedee Cantor, MD  Follow-up with PCP or another provider has been scheduled:  Future Appointments  Date Time Provider Department Center  04/30/2024  1:25 PM Maree Lulas, MD [facility]  06/12/2024  3:20 PM Pia Sharper, MD [facility]     Total time spent face to face with the patient was 40  minutes of  which 35 minutes were spent counseling/coordinating care regarding her: recent hospitalization and the following conditions: tobacco use disorder, CAD, NSTEMI, AOM, hypomagnesemia     [facility]  at [facility] [address], [place], [place] [address] . Telephone [phone] . Fax [phone] [email or web address]

## 2024-04-28 NOTE — Progress Notes (Signed)
 DIVISION OF CARDIOLOGY [facility] , [name]       Date of Service: 04/30/2024  New Patient Clinic Note  PCP: Referring Provider:  Leonce Zebedee Cantor, MD [id number] [facility] MEDICINE [place] [address] Phone: [phone] Fax: [phone] Pcp, None Per Patient [address] [place],  [place] [address] Phone: N/A Fax:    Assessment and Plan:    [name] is a 43 y.o. PMH HTN, CAD s/p PCI to , HCM, OSA, tobacco use who presents to cardiology clinic.   #CAD s/p PCI RCA, mLCx 2022 and LM-mid LAD 04/20/24.  LHC 04/20/24 showed several areas of subacute stenosis - including occluded RCA with left to right collaterals, 80% hazy proximal LAD stenosis, 70% D1 stenosis, 80% mid-LAD stenosis, 40% mid-circumflex instent restenosis, 80% stenosis in a small OM1, and 50% stenosis in OM2. Will medically manage the mid-LAD for now and re-evaluate going forward - asa, plavix  until at least 03/2025 - cardiac rehab is pending  #HLD Goal LDL < 55. Couldn't tolerate atorva 80 but tolerating 40mg  - atorva 40 - zetia 10  #HCM Echo 11/25 showing normal EF, asymmetric septal hypertrophy with septal thickness of 2.1 cm. Has follow-up scheduled with Dr. Pia. Echo did not show any gradient.  - continue metop succinate 200 - sees Dr. Pia 1/16 - advised for her daughter to get echo  #HTN Goal <130/80. BP today elevated. Have to manage in conjunction with the HCM and may need to adjust medications (like the pure vasodilators, diuretics) if HCM gradient develops but for now will manage to improve BP control - on amlodipine  10, chlorthalidone 25, lisinopirl 40, spiro 25 - she will monitor BP at home  #OSA Not wearing CPAP. Advised to start  #tobacco use Still smoking 1 ppd. But started chantix 12/3.   Return to clinic: 3 months   The patient was seen and discussed with Dr. Gil.  Trenda Fairly, MD, MBA Cardiovascular Disease  Fellow  Subjective:     Reason for Visit: establish cardiology care  History of Present Illness: [name] is a 43 y.o. HTN, CAD s/p PCI to , HCM, OSA, tobacco use who presents to cardiology clinic.   Recent hospitalization 11/21-11/25 for NSTEMI. With LHC showing several areas of subacute stenosis (including occluded RCA with left to right collaterals, 80% hazy proximal LAD stenosis, 70% D1 stenosis, 80% mid-LAD stenosis, 40% mid-circumflex instent restenosis, 80% stenosis in a small OM1, and 50% stenosis in OM2). Transferred to [facility] for complex PCI, underwent successful PCI of LM-mid LAD lesion with DES on 04/20/24  Presents to clinic today with her mom.   History of Present Illness She experiences persistent fatigue and low energy levels, ongoing since before her recent hospitalization, with minimal improvement post-discharge. She describes her energy as 'all day, nothing' and feels the need to lay down. Her current cold is exacerbating her fatigue. Sleep is inconsistent, with some nights leaving her rested and others groggy. No chest pain or syncope. She has not been tested for sleep apnea and does not use a CPAP mask.  She has a history of coronary artery disease and underwent a significant procedure for a major blockage. Her current medications include amlodipine , aspirin , atorvastatin , chlorthalidone, Plavix , ezetimibe, metoprolol , and spironolactone , with no reported side effects.  She has hypertrophic cardiomyopathy, identified through an ultrasound showing thickening of the heart muscle. She has not experienced syncope or unconsciousness.  Her blood pressure is elevated, and she is  on medication for management. She monitors her blood pressure at home but needs to replace the batteries in her machine.  She smokes a pack of cigarettes daily and has started Chantix and nicotine  patches to quit. She consumes alcohol, primarily liquor, on weekends. She is not currently employed but  previously worked at [employer] and lives with her 50 year old daughter.  Echo: 03/2024   1. The left ventricle is normal in size with severely increased wall thickness.   2. There is asymmetric septal hypertrophy, maximal measured septal thickness 2.1 cm.   3. The left ventricular systolic function is normal, LVEF is visually estimated at > 55%.   4. Mid anterior hypokinesis is noted.   5. There is thinning and akinesis involving the basal inferior and basal inferoseptal segments, with an aneurysmal appearance.   6. There is grade II diastolic dysfunction (elevated filling pressure).   7. The right ventricle is normal in size, with normal systolic function.  05/2022  1. The left ventricle is normal in size with severely increased wall thickness consistent with hypertrophic cardiomyopathy (Basal anteroseptal wall =2.2cm).   2. There is no evidence of obstructive physiology.   3. The left ventricular systolic function is normal, LVEF is visually estimated at > 55%.   4. The right ventricle is normal in size, with normal systolic function.    Stress test: Echo stress 07/2017 Reduced exercise capacity, with no exercise induced chest pain or dyspnea at an estimated workload of 6.9 METS. Normal ECG response to exercise at 91 % of the age-predicted maximal HR. Baseline hypertension with hypertensive response to exercise. Normal echocardiographic response to exercise stress. The study suggests a low probability of clinically significant coronary artery disease and/or adverse cardiac events, however, the sensitivity of the study is reduced due to very poor exercise capacity and deconditioning.  Cardiac catheterization: 03/2024 Inferior and lateral wall hypokinesis with reduced LV systolic function  Multi-vessel coronary artery disease including occluded RCA with left to right collaterals, 80% hazy proximal LAD stenosis, 70% D1 stenosis, 80% mid-LAD stenosis, 40% mid-circumflex instent  restenosis, 70% stenosis in a small OM1, and 40-50% stenosis in OM2 Markedly elevated LVEDP at 29 mm Hg  10/2021 - Chronic occlusion of RCA with left to right  collaterals  - Moderate disease of LAD and LCx - LVEDP 9 mm Hg    EP Procedures and Devices: ZioPatch 10/2021 Patient had a min HR of 76 bpm, max HR of 193 bpm, and avg HR of 108  bpm. Predominant underlying rhythm was Sinus Rhythm. Biphasic P waves  were noted throughout recording. 2 Supraventricular Tachycardia runs  occurred, the run with the fastest interval lasting 4 beats with a max rate  of 193 bpm, the longest lasting 11 beats with an avg rate of 155 bpm.  Isolated SVEs were rare (<1.0%), SVE Triplets were rare (<1.0%), and no  SVE Couplets were present. Isolated VEs were rare (<1.0%), VE Couplets  were rare (<1.0%), and no VE Triplets were present.   Non-Invasive Studies: cMRI 09/2021 Myocardial hypertrophy of the aforementioned multiple myocardial segments with maximum wall thickness measuring 25 mm in the mid anteroseptal wall with patchy and nodular mesocardial delayed gadolinium enhancement involving the aforementioned segments with intense nodular enhancement in the basal anteroseptal wall. There is no evidence of left ventricular outflow tract obstruction/gradient, findings consistent with hypertrophic cardiomyopathy.    Normal sized left ventricle (Index LVEDV is 93 mL/m2) with myocardial hypertrophy (Index LV Mass is 111 grams/m2) and marginally depressed left  ventricular function with calculated LVEF of 54%.    Relevant Past Medical History: Active Ambulatory Problems    Diagnosis Date Noted  . Hypertension 02/11/2013  . 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 Coronary atherosclerosis 05/01/2013  . 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 Hypertension, benign 01/09/2011  . Tobacco use disorder 01/15/2011  . Hidradenitis 07/18/2012  . Dyspnea on exertion 06/24/2013  .  Pulmonary emphysema (CMS-HCC) 06/24/2013  . Depression 06/24/2013  . Breast abscess 07/16/2013  . Chest pain 10/30/2013  . Stable angina 02/19/2015  . Non-ST elevation (NSTEMI) myocardial infarction (CMS-HCC) 10/05/2018  . Hypertensive emergency 09/15/2021  . Moderate obstructive sleep apnea 09/15/2021  . Elevated troponin 09/15/2021  . Syncope 10/16/2021  . Viral illness 06/04/2022  . Macrocytosis 06/05/2022  . Uncontrolled hypertension 02/27/2023  . Coronary artery disease 02/27/2023  . HOCM (hypertrophic obstructive cardiomyopathy)    (CMS-HCC) 02/27/2023  . Hypomagnesemia 02/27/2023  . Difficulty obtaining medication 02/27/2023  . NSTEMI (non-ST elevated myocardial infarction) (CMS-HCC) 04/17/2024  . Hypokalemia 04/18/2024   Resolved Ambulatory Problems    Diagnosis Date Noted  . No Resolved Ambulatory Problems   Past Medical History:  Diagnosis Date  . Arthritis   . Breast cyst   . CAD (coronary artery disease)   . Hidradenitis suppurativa   . Myocardial infarction (CMS-HCC)   . Tobacco abuse     Patient's Medications  New Prescriptions   No medications on file  Previous Medications   ACETAMINOPHEN  (TYLENOL ) 500 MG TABLET    Take 2 tablets (1,000 mg total) by mouth every six (6) hours as needed for pain.   AMLODIPINE  (NORVASC ) 10 MG TABLET    Take 1 tablet (10 mg total) by mouth daily.   ASPIRIN  (ECOTRIN) 81 MG TABLET    Take 1 tablet (81 mg total) by mouth daily.   ATORVASTATIN  (LIPITOR) 40 MG TABLET    Take 1 tablet (40 mg total) by mouth daily.   CHLORTHALIDONE (HYGROTON) 25 MG TABLET    Take 1 tablet (25 mg total) by mouth daily.   CLOPIDOGREL  (PLAVIX ) 75 MG TABLET    Take 1 tablet (75 mg total) by mouth daily   EMPAGLIFLOZIN (JARDIANCE) 10 MG TABLET    Take 1 tablet (10 mg total) by mouth daily.   EZETIMIBE (ZETIA) 10 MG TABLET    Take 1 tablet (10 mg total) by mouth nightly.   IBUPROFEN (ADVIL,MOTRIN) 200 MG TABLET    Take 2 tablets (400 mg total) by mouth  once as needed for pain.   LISINOPRIL  (PRINIVIL ,ZESTRIL ) 40 MG TABLET    Take 1 tablet (40 mg total) by mouth daily.   METOPROLOL  SUCCINATE (TOPROL -XL) 200 MG 24 HR TABLET    Take 1 tablet (200 mg total) by mouth daily.   NICOTINE  (NICODERM CQ ) 21 MG/24 HR PATCH    Place 1 patch on the skin daily.   NICOTINE  POLACRILEX (NICORETTE) 4 MG GUM    Apply 1 each (4 mg total) to cheek every hour as needed for smoking cessation.   NITROGLYCERIN  (NITROSTAT ) 0.4 MG SL TABLET    Place 1 tablet (0.4 mg total) under the tongue every five (5) minutes as needed for chest pain (If chest pain continues after 3 tabs or 15 minutes, head to your closest emergency room).   SPIRONOLACTONE  (ALDACTONE ) 25 MG TABLET    Take 1 tablet (25 mg total) by mouth daily.  Modified Medications   No medications on file  Discontinued Medications  No medications on file    Allergies: Allergies[1]  Social History: She  reports that she has been smoking cigarettes. She has never used smokeless tobacco. She reports current alcohol use of about 6.0 standard drinks of alcohol per week. She reports that she does not use drugs.  Family History: Her family history includes Breast cancer (age of onset: 19) in her maternal Puff; Breast cancer (age of onset: 83) in her maternal aunt and maternal grandmother; Colon cancer in her maternal aunt; No Known Problems in her daughter, father, maternal grandfather, mother, paternal grandfather, paternal grandmother, sister, and another family member.  Review of Systems 10 systems were reviewed and negative except as noted in HPI.    Objective:     Physical Exam There were no vitals taken for this visit.  Wt Readings from Last 3 Encounters:  04/20/24 65.7 kg (144 lb 12.8 oz)  12/30/23 67.6 kg (149 lb)  02/26/23 63.6 kg (140 lb 1.6 oz)    General:  Alert, no distress.  HEENT:  EOMI, sclerae anicteric, MMM.  Neck: No carotid bruit. JVP not appreciably elevated  Lungs:   CTAB bilaterally  with normal WOB.  Heart:  Regular rhythm, normal rate, normal S1/S2. Systolic murmur best appreciated over LUSB  Extremities: 2+ radial pulse. No edema bilaterally.  Skin: warm  Neurologic: No focal deficits.    Most recent labs   Lab Results  Component Value Date   WBC 7.0 04/21/2024   RBC 3.61 (L) 04/21/2024   HGB 12.5 04/21/2024   HCT 36.4 04/21/2024   MCV 101.1 (H) 04/21/2024   MCH 34.7 (H) 04/21/2024   MCHC 34.3 04/21/2024   RDW 15.2 04/21/2024   PLT 157 04/21/2024   MPV 9.3 04/21/2024    Lab Results  Component Value Date   NA 140 04/21/2024   K 4.0 04/21/2024   CL 104 04/21/2024   CO2 26.0 04/21/2024   BUN 6 (L) 04/21/2024   CREATININE 0.77 04/21/2024   GLU 122 04/21/2024   CALCIUM  9.3 04/21/2024   ALBUMIN 3.7 04/17/2024   PHOS 3.2 05/23/2021    Lab Results  Component Value Date   CHOL 184 04/20/2024   CHOL 140 02/26/2023   CHOL 223 (H) 06/04/2022   Lab Results  Component Value Date   HDL 68 04/20/2024   HDL 49 02/26/2023   HDL 85 (H) 06/04/2022   Lab Results  Component Value Date   LDL 95 04/20/2024   Lab Results  Component Value Date   TRIG 134 04/20/2024   TRIG 251 (H) 02/26/2023   TRIG 261 (H) 06/04/2022   No components found for: [facility]  Lab Results  Component Value Date   PT 11.5 04/17/2024   INR 1.01 04/17/2024              [1] No Known Allergies

## 2024-04-28 NOTE — Progress Notes (Signed)
 Family Medicine Care Management Transitions of Care Note  Presenting Problem: [name] has been identified as a Transitions patient who is at risk for readmission.  [name] presents to [facility] 7 days post discharge. She was hospitalized for a heart attack. She reports feeling better since discharge. She is still endorsing occasional chest pain and SOB. She also endorses a sore throat and cough.  Assessment: Social History[1]  Any home health services recommended at discharge? No Any DME needs identified at discharge? No  Behavioral Health: Patient presented with a pleasant and appropriate affect. Patient had no mental health needs at this time.   Substance Use: Tobacco use:   Advanced Directives: does not have on file. Health Care Decision Maker already completed.  Intervention: Introduced self and role at [facility]. Reviewed events leading to hospitalization. SW used problem solving skills to determine barriers to care. SW reviewed supports noting main source of support. Gathered current concerns related to current health conditions, medications, and mental health. SW debriefed information with pharmacist and provider.  Reviewed same day or after hours care options.  SDOH needs: I provided an intervention for the [employer] and Food Insecurity SDOH domain. The intervention was Provided [employer].    Additional information/Plan: [name] was provided with this writer's direct office phone number should additional needs arise.  [name], LCSW [facility]      [1] Social History Socioeconomic History  . Marital status: Married    Spouse name: None  . Number of children: None  . Years of education: None  . Highest education level: None  Tobacco Use  . Smoking status: Every Day    Current packs/day: 1.00    Types: Cigarettes  . Smokeless tobacco: Never  . Tobacco comments:    20cpd; wants to reduce use and work towards  quitting  Vaping Use  . Vaping status: Never Used  Substance and Sexual Activity  . Alcohol use: Yes    Alcohol/week: 6.0 standard drinks of alcohol    Types: 6 Shots of liquor per week    Comment: occasional on weekends  . Drug use: No  Social History Narrative   The patient is single.  She has three children   Social Drivers of Health   Food Insecurity: No Food Insecurity (12/30/2023)   Hunger Vital Sign   . Worried About Programme Researcher, Broadcasting/film/video in the Last Year: Never true   . Ran Out of Food in the Last Year: Never true  Tobacco Use: High Risk (04/28/2024)   Patient History   . Smoking Tobacco Use: Every Day   . Smokeless Tobacco Use: Never  Transportation Needs: No Transportation Needs (12/30/2023)   PRAPARE - Transportation   . Lack of Transportation (Medical): No   . Lack of Transportation (Non-Medical): No  Housing: Low Risk (12/30/2023)   Housing   . Within the past 12 months, have you ever stayed: outside, in a car, in a tent, in an overnight shelter, or temporarily in someone else's home (i.e. couch-surfing)?: No   . Are you worried about losing your housing?: No  Utilities: Low Risk (12/30/2023)   Utilities   . Within the past 12 months, have you been unable to get utilities (heat, electricity) when it was really needed?: No  Interpersonal Safety: Not At Risk (04/17/2024)   Interpersonal Safety   . Unsafe Where You Currently Live: No   . Physically Hurt by Anyone: No   . Abused  by Anyone: No  Financial Resource Strain: Low Risk (05/31/2022)   Overall Financial Resource Strain (CARDIA)   . Difficulty of Paying Living Expenses: Not hard at all

## 2024-05-02 DIAGNOSIS — Z8679 Personal history of other diseases of the circulatory system: Secondary | ICD-10-CM

## 2024-05-02 DIAGNOSIS — E119 Type 2 diabetes mellitus without complications: Secondary | ICD-10-CM | POA: Insufficient documentation

## 2024-05-02 DIAGNOSIS — I469 Cardiac arrest, cause unspecified: Secondary | ICD-10-CM | POA: Insufficient documentation

## 2024-05-02 DIAGNOSIS — I10 Essential (primary) hypertension: Secondary | ICD-10-CM | POA: Insufficient documentation

## 2024-05-02 DIAGNOSIS — I2510 Atherosclerotic heart disease of native coronary artery without angina pectoris: Secondary | ICD-10-CM | POA: Insufficient documentation

## 2024-05-02 DIAGNOSIS — F10920 Alcohol use, unspecified with intoxication, uncomplicated: Secondary | ICD-10-CM | POA: Insufficient documentation

## 2024-05-02 HISTORY — DX: Type 2 diabetes mellitus without complications: E11.9

## 2024-05-02 HISTORY — DX: Atherosclerotic heart disease of native coronary artery without angina pectoris: I25.10

## 2024-05-02 HISTORY — DX: Essential (primary) hypertension: I10

## 2024-05-02 MED ADMIN — Naloxone HCl Soln Prefilled Syringe 2 MG/2ML: 2 mg | INTRAVENOUS | NDC 67457099202

## 2024-05-02 MED ADMIN — Sodium Chloride IV Soln 0.9%: 1000 mL | INTRAVENOUS | NDC 00338004904

## 2024-05-02 MED ADMIN — Epinephrine IV Soln Prefilled Syringe 1 MG/10ML (0.1 MG/ML): 1 mg | INTRAVENOUS | NDC 76329331801

## 2024-05-02 MED ADMIN — Sodium Bicarbonate IV Soln 8.4%: 50 meq | INTRAVENOUS | NDC 76329335201

## 2024-05-04 ENCOUNTER — Encounter: Payer: Self-pay | Admitting: Physician Assistant

## 2024-05-04 LAB — CBG MONITORING, ED: Glucose-Capillary: 96 mg/dL (ref 70–99)

## 2024-05-28 DEATH — deceased
